# Patient Record
Sex: Male | Born: 1960 | Race: Black or African American | Hispanic: No | Marital: Married | Smoking: Current every day smoker
Health system: Southern US, Community
[De-identification: ages and names within clinical notes are randomized; demographics above are authoritative.]

## PROBLEM LIST (undated history)

## (undated) DIAGNOSIS — R7303 Prediabetes: Secondary | ICD-10-CM

## (undated) DIAGNOSIS — M2550 Pain in unspecified joint: Secondary | ICD-10-CM

## (undated) DIAGNOSIS — N451 Epididymitis: Secondary | ICD-10-CM

## (undated) DIAGNOSIS — N400 Enlarged prostate without lower urinary tract symptoms: Secondary | ICD-10-CM

## (undated) DIAGNOSIS — H269 Unspecified cataract: Secondary | ICD-10-CM

## (undated) DIAGNOSIS — T884XXA Failed or difficult intubation, initial encounter: Secondary | ICD-10-CM

## (undated) DIAGNOSIS — G4733 Obstructive sleep apnea (adult) (pediatric): Secondary | ICD-10-CM

## (undated) DIAGNOSIS — M549 Dorsalgia, unspecified: Secondary | ICD-10-CM

## (undated) DIAGNOSIS — E785 Hyperlipidemia, unspecified: Secondary | ICD-10-CM

## (undated) DIAGNOSIS — G4730 Sleep apnea, unspecified: Secondary | ICD-10-CM

## (undated) DIAGNOSIS — R05 Cough: Secondary | ICD-10-CM

## (undated) DIAGNOSIS — J189 Pneumonia, unspecified organism: Secondary | ICD-10-CM

## (undated) DIAGNOSIS — M1990 Unspecified osteoarthritis, unspecified site: Secondary | ICD-10-CM

## (undated) DIAGNOSIS — R058 Other specified cough: Secondary | ICD-10-CM

## (undated) HISTORY — PX: CATARACT EXTRACTION W/ INTRAOCULAR LENS IMPLANT: SHX1309

## (undated) HISTORY — PX: TESTICLE REMOVAL: SHX68

## (undated) HISTORY — DX: Unspecified cataract: H26.9

## (undated) HISTORY — DX: Pain in unspecified joint: M25.50

## (undated) HISTORY — DX: Hyperlipidemia, unspecified: E78.5

## (undated) HISTORY — DX: Morbid (severe) obesity due to excess calories: E66.01

## (undated) HISTORY — PX: WISDOM TOOTH EXTRACTION: SHX21

## (undated) HISTORY — DX: Obstructive sleep apnea (adult) (pediatric): G47.33

## (undated) HISTORY — DX: Unspecified osteoarthritis, unspecified site: M19.90

## (undated) HISTORY — DX: Sleep apnea, unspecified: G47.30

## (undated) HISTORY — DX: Dorsalgia, unspecified: M54.9

## (undated) HISTORY — DX: Epididymitis: N45.1

---

## 1999-11-21 ENCOUNTER — Emergency Department (HOSPITAL_COMMUNITY): Admission: EM | Admit: 1999-11-21 | Discharge: 1999-11-21 | Payer: Self-pay | Admitting: Emergency Medicine

## 2001-06-19 ENCOUNTER — Emergency Department (HOSPITAL_COMMUNITY): Admission: EM | Admit: 2001-06-19 | Discharge: 2001-06-19 | Payer: Self-pay | Admitting: Emergency Medicine

## 2001-06-19 ENCOUNTER — Encounter: Payer: Self-pay | Admitting: Emergency Medicine

## 2005-05-04 ENCOUNTER — Ambulatory Visit: Payer: Self-pay | Admitting: Internal Medicine

## 2006-02-23 ENCOUNTER — Ambulatory Visit: Payer: Self-pay | Admitting: Internal Medicine

## 2006-03-09 ENCOUNTER — Ambulatory Visit (HOSPITAL_BASED_OUTPATIENT_CLINIC_OR_DEPARTMENT_OTHER): Admission: RE | Admit: 2006-03-09 | Discharge: 2006-03-09 | Payer: Self-pay | Admitting: Internal Medicine

## 2006-03-16 ENCOUNTER — Ambulatory Visit: Payer: Self-pay | Admitting: Pulmonary Disease

## 2006-05-28 ENCOUNTER — Ambulatory Visit: Payer: Self-pay | Admitting: Internal Medicine

## 2006-12-15 ENCOUNTER — Ambulatory Visit: Payer: Self-pay | Admitting: Internal Medicine

## 2006-12-15 LAB — CONVERTED CEMR LAB
ALT: 36 units/L (ref 0–40)
AST: 22 units/L (ref 0–37)
Albumin: 3.5 g/dL (ref 3.5–5.2)
Alkaline Phosphatase: 80 units/L (ref 39–117)
BUN: 11 mg/dL (ref 6–23)
Bilirubin, Direct: 0.1 mg/dL (ref 0.0–0.3)
CO2: 26 meq/L (ref 19–32)
CRP, High Sensitivity: 9 — ABNORMAL HIGH (ref 0.00–5.00)
Calcium: 9.1 mg/dL (ref 8.4–10.5)
Chloride: 109 meq/L (ref 96–112)
Cholesterol: 176 mg/dL (ref 0–200)
Creatinine, Ser: 1.1 mg/dL (ref 0.4–1.5)
GFR calc Af Amer: 92 mL/min
GFR calc non Af Amer: 76 mL/min
Glucose, Bld: 97 mg/dL (ref 70–99)
HDL: 26.9 mg/dL — ABNORMAL LOW (ref >39.0)
LDL Cholesterol: 116 mg/dL — ABNORMAL HIGH (ref 0–99)
PSA: 1.72 ng/mL (ref 0.10–4.00)
Potassium: 4.2 meq/L (ref 3.5–5.1)
Sodium: 139 meq/L (ref 135–145)
TSH: 1.43 microintl units/mL (ref 0.35–5.50)
Total Bilirubin: 0.4 mg/dL (ref 0.3–1.2)
Total CHOL/HDL Ratio: 6.5
Total Protein: 6.8 g/dL (ref 6.0–8.3)
Triglycerides: 164 mg/dL — ABNORMAL HIGH (ref 0–149)
VLDL: 33 mg/dL (ref 0–40)

## 2007-06-30 ENCOUNTER — Ambulatory Visit: Payer: Self-pay | Admitting: Internal Medicine

## 2007-08-03 ENCOUNTER — Ambulatory Visit: Payer: Self-pay | Admitting: Pulmonary Disease

## 2007-08-03 ENCOUNTER — Ambulatory Visit (HOSPITAL_BASED_OUTPATIENT_CLINIC_OR_DEPARTMENT_OTHER): Admission: RE | Admit: 2007-08-03 | Discharge: 2007-08-03 | Payer: Self-pay | Admitting: Internal Medicine

## 2007-08-24 ENCOUNTER — Ambulatory Visit: Payer: Self-pay | Admitting: Internal Medicine

## 2007-09-06 DIAGNOSIS — N453 Epididymo-orchitis: Secondary | ICD-10-CM | POA: Insufficient documentation

## 2007-09-06 DIAGNOSIS — G4733 Obstructive sleep apnea (adult) (pediatric): Secondary | ICD-10-CM

## 2007-09-28 ENCOUNTER — Telehealth: Payer: Self-pay | Admitting: Internal Medicine

## 2007-10-11 ENCOUNTER — Ambulatory Visit: Payer: Self-pay | Admitting: Pulmonary Disease

## 2007-11-21 ENCOUNTER — Telehealth: Payer: Self-pay | Admitting: Pulmonary Disease

## 2007-11-22 ENCOUNTER — Encounter: Payer: Self-pay | Admitting: Pulmonary Disease

## 2007-12-16 ENCOUNTER — Ambulatory Visit: Payer: Self-pay | Admitting: Internal Medicine

## 2007-12-16 DIAGNOSIS — R05 Cough: Secondary | ICD-10-CM

## 2007-12-21 ENCOUNTER — Ambulatory Visit: Payer: Self-pay | Admitting: Internal Medicine

## 2007-12-22 DIAGNOSIS — H6123 Impacted cerumen, bilateral: Secondary | ICD-10-CM | POA: Insufficient documentation

## 2007-12-26 LAB — CONVERTED CEMR LAB
ALT: 38 units/L (ref 0–53)
AST: 23 units/L (ref 0–37)
Albumin: 3.6 g/dL (ref 3.5–5.2)
Alkaline Phosphatase: 80 units/L (ref 39–117)
BUN: 8 mg/dL (ref 6–23)
Basophils Absolute: 0 10*3/uL (ref 0.0–0.1)
Basophils Relative: 0.5 % (ref 0.0–1.0)
Bilirubin Urine: NEGATIVE
Bilirubin, Direct: 0.1 mg/dL (ref 0.0–0.3)
CO2: 30 meq/L (ref 19–32)
Calcium: 9.4 mg/dL (ref 8.4–10.5)
Chloride: 104 meq/L (ref 96–112)
Cholesterol: 172 mg/dL (ref 0–200)
Creatinine, Ser: 1.1 mg/dL (ref 0.4–1.5)
Eosinophils Absolute: 0.2 10*3/uL (ref 0.0–0.6)
Eosinophils Relative: 2.2 % (ref 0.0–5.0)
GFR calc Af Amer: 92 mL/min
GFR calc non Af Amer: 76 mL/min
Glucose, Bld: 100 mg/dL — ABNORMAL HIGH (ref 70–99)
HCT: 40.4 % (ref 39.0–52.0)
HDL: 22.3 mg/dL — ABNORMAL LOW (ref >39.0)
Hemoglobin: 13.4 g/dL (ref 13.0–17.0)
Ketones, ur: NEGATIVE mg/dL
LDL Cholesterol: 121 mg/dL — ABNORMAL HIGH (ref 0–99)
Leukocytes, UA: NEGATIVE
Lymphocytes Relative: 20.4 % (ref 12.0–46.0)
MCHC: 33.1 g/dL (ref 30.0–36.0)
MCV: 82.6 fL (ref 78.0–100.0)
Monocytes Absolute: 1.1 10*3/uL — ABNORMAL HIGH (ref 0.2–0.7)
Monocytes Relative: 12 % — ABNORMAL HIGH (ref 3.0–11.0)
Neutro Abs: 5.9 10*3/uL (ref 1.4–7.7)
Neutrophils Relative %: 64.9 % (ref 43.0–77.0)
Nitrite: NEGATIVE
Platelets: 317 10*3/uL (ref 150–400)
Potassium: 4.2 meq/L (ref 3.5–5.1)
Pro B Natriuretic peptide (BNP): 13 pg/mL (ref 0.0–100.0)
RBC: 4.89 M/uL (ref 4.22–5.81)
RDW: 13.8 % (ref 11.5–14.6)
Sodium: 141 meq/L (ref 135–145)
Specific Gravity, Urine: 1.01 (ref 1.000–1.03)
TSH: 1.2 microintl units/mL (ref 0.35–5.50)
Total Bilirubin: 0.4 mg/dL (ref 0.3–1.2)
Total CHOL/HDL Ratio: 7.7
Total Protein, Urine: NEGATIVE mg/dL
Total Protein: 6.9 g/dL (ref 6.0–8.3)
Triglycerides: 145 mg/dL (ref 0–149)
Urine Glucose: NEGATIVE mg/dL
Urobilinogen, UA: 0.2 (ref 0.0–1.0)
VLDL: 29 mg/dL (ref 0–40)
WBC: 9.1 10*3/uL (ref 4.5–10.5)
pH: 7 (ref 5.0–8.0)

## 2008-01-12 ENCOUNTER — Telehealth (INDEPENDENT_AMBULATORY_CARE_PROVIDER_SITE_OTHER): Payer: Self-pay | Admitting: *Deleted

## 2008-01-25 ENCOUNTER — Telehealth: Payer: Self-pay | Admitting: Internal Medicine

## 2009-04-16 ENCOUNTER — Telehealth (INDEPENDENT_AMBULATORY_CARE_PROVIDER_SITE_OTHER): Payer: Self-pay | Admitting: *Deleted

## 2009-04-25 ENCOUNTER — Ambulatory Visit: Payer: Self-pay | Admitting: Internal Medicine

## 2009-04-25 ENCOUNTER — Ambulatory Visit: Payer: Self-pay | Admitting: Psychology

## 2009-04-25 DIAGNOSIS — E7800 Pure hypercholesterolemia, unspecified: Secondary | ICD-10-CM

## 2009-11-08 ENCOUNTER — Encounter: Payer: Self-pay | Admitting: Internal Medicine

## 2010-07-02 ENCOUNTER — Telehealth: Payer: Self-pay | Admitting: Internal Medicine

## 2010-08-19 ENCOUNTER — Encounter: Payer: Self-pay | Admitting: Internal Medicine

## 2010-08-19 ENCOUNTER — Ambulatory Visit: Payer: Self-pay | Admitting: Internal Medicine

## 2010-08-19 DIAGNOSIS — Z87898 Personal history of other specified conditions: Secondary | ICD-10-CM

## 2010-08-19 DIAGNOSIS — F17210 Nicotine dependence, cigarettes, uncomplicated: Secondary | ICD-10-CM

## 2010-08-19 LAB — CONVERTED CEMR LAB
ALT: 28 units/L (ref 0–53)
AST: 22 units/L (ref 0–37)
Albumin: 3.8 g/dL (ref 3.5–5.2)
Alkaline Phosphatase: 83 units/L (ref 39–117)
BUN: 12 mg/dL (ref 6–23)
Basophils Absolute: 0 10*3/uL (ref 0.0–0.1)
Basophils Relative: 0.4 % (ref 0.0–3.0)
Bilirubin Urine: NEGATIVE
Bilirubin, Direct: 0 mg/dL (ref 0.0–0.3)
CO2: 26 meq/L (ref 19–32)
Calcium: 9.4 mg/dL (ref 8.4–10.5)
Chloride: 103 meq/L (ref 96–112)
Cholesterol: 201 mg/dL — ABNORMAL HIGH (ref 0–200)
Creatinine, Ser: 0.9 mg/dL (ref 0.4–1.5)
Direct LDL: 139.5 mg/dL
Eosinophils Absolute: 0.2 10*3/uL (ref 0.0–0.7)
Eosinophils Relative: 1.8 % (ref 0.0–5.0)
GFR calc non Af Amer: 113.1 mL/min (ref >60)
Glucose, Bld: 90 mg/dL (ref 70–99)
HCT: 42 % (ref 39.0–52.0)
HDL: 27.5 mg/dL — ABNORMAL LOW (ref >39.00)
Hemoglobin: 14.3 g/dL (ref 13.0–17.0)
Ketones, ur: NEGATIVE mg/dL
Leukocytes, UA: NEGATIVE
Lymphocytes Relative: 19.2 % (ref 12.0–46.0)
Lymphs Abs: 2 10*3/uL (ref 0.7–4.0)
MCHC: 34.1 g/dL (ref 30.0–36.0)
MCV: 83.5 fL (ref 78.0–100.0)
Monocytes Absolute: 0.7 10*3/uL (ref 0.1–1.0)
Monocytes Relative: 6.6 % (ref 3.0–12.0)
Neutro Abs: 7.6 10*3/uL (ref 1.4–7.7)
Neutrophils Relative %: 72 % (ref 43.0–77.0)
Nitrite: NEGATIVE
Platelets: 289 10*3/uL (ref 150.0–400.0)
Potassium: 4.4 meq/L (ref 3.5–5.1)
RBC: 5.03 M/uL (ref 4.22–5.81)
RDW: 15 % — ABNORMAL HIGH (ref 11.5–14.6)
Sodium: 136 meq/L (ref 135–145)
Specific Gravity, Urine: 1.02 (ref 1.000–1.030)
TSH: 1.22 microintl units/mL (ref 0.35–5.50)
Total Bilirubin: 0.4 mg/dL (ref 0.3–1.2)
Total CHOL/HDL Ratio: 7
Total Protein, Urine: NEGATIVE mg/dL
Total Protein: 7.1 g/dL (ref 6.0–8.3)
Triglycerides: 180 mg/dL — ABNORMAL HIGH (ref 0.0–149.0)
Urine Glucose: NEGATIVE mg/dL
Urobilinogen, UA: 0.2 (ref 0.0–1.0)
VLDL: 36 mg/dL (ref 0.0–40.0)
WBC: 10.6 10*3/uL — ABNORMAL HIGH (ref 4.5–10.5)
pH: 6.5 (ref 5.0–8.0)

## 2010-08-20 LAB — CONVERTED CEMR LAB
PSA, Free Pct: 13 — ABNORMAL LOW (ref >25)
PSA, Free: 0.3 ng/mL
PSA: 2.27 ng/mL (ref 0.10–4.00)

## 2010-09-02 ENCOUNTER — Ambulatory Visit: Payer: Self-pay | Admitting: Internal Medicine

## 2010-09-17 ENCOUNTER — Encounter (INDEPENDENT_AMBULATORY_CARE_PROVIDER_SITE_OTHER): Payer: Self-pay | Admitting: *Deleted

## 2010-09-19 ENCOUNTER — Ambulatory Visit: Payer: Self-pay | Admitting: Internal Medicine

## 2010-10-08 ENCOUNTER — Telehealth: Payer: Self-pay | Admitting: Internal Medicine

## 2010-10-09 ENCOUNTER — Ambulatory Visit: Payer: Self-pay | Admitting: Internal Medicine

## 2010-10-23 ENCOUNTER — Ambulatory Visit: Payer: Self-pay | Admitting: Internal Medicine

## 2010-12-01 ENCOUNTER — Telehealth: Payer: Self-pay | Admitting: Internal Medicine

## 2010-12-07 LAB — CONVERTED CEMR LAB
ALT: 32 units/L (ref 0–53)
AST: 24 units/L (ref 0–37)
Albumin: 3.7 g/dL (ref 3.5–5.2)
Alkaline Phosphatase: 73 units/L (ref 39–117)
BUN: 11 mg/dL (ref 6–23)
Basophils Absolute: 0 10*3/uL (ref 0.0–0.1)
Basophils Relative: 0 % (ref 0.0–3.0)
Bilirubin, Direct: 0.1 mg/dL (ref 0.0–0.3)
CO2: 31 meq/L (ref 19–32)
CRP, High Sensitivity: 10 — ABNORMAL HIGH (ref 0.00–5.00)
Calcium: 9.4 mg/dL (ref 8.4–10.5)
Chloride: 105 meq/L (ref 96–112)
Cholesterol: 193 mg/dL (ref 0–200)
Creatinine, Ser: 1 mg/dL (ref 0.4–1.5)
Eosinophils Absolute: 0.2 10*3/uL (ref 0.0–0.7)
Eosinophils Relative: 1.8 % (ref 0.0–5.0)
GFR calc non Af Amer: 101.98 mL/min (ref >60)
Glucose, Bld: 90 mg/dL (ref 70–99)
HCT: 41.1 % (ref 39.0–52.0)
HDL: 27.8 mg/dL — ABNORMAL LOW (ref >39.00)
Hemoglobin: 14.1 g/dL (ref 13.0–17.0)
LDL Cholesterol: 143 mg/dL — ABNORMAL HIGH (ref 0–99)
Lymphocytes Relative: 19 % (ref 12.0–46.0)
Lymphs Abs: 1.8 10*3/uL (ref 0.7–4.0)
MCHC: 34.4 g/dL (ref 30.0–36.0)
MCV: 83.2 fL (ref 78.0–100.0)
Monocytes Absolute: 0.7 10*3/uL (ref 0.1–1.0)
Monocytes Relative: 6.8 % (ref 3.0–12.0)
Neutro Abs: 7 10*3/uL (ref 1.4–7.7)
Neutrophils Relative %: 72.4 % (ref 43.0–77.0)
PSA: 1.75 ng/mL (ref 0.10–4.00)
Platelets: 251 10*3/uL (ref 150.0–400.0)
Potassium: 4.4 meq/L (ref 3.5–5.1)
RBC: 4.94 M/uL (ref 4.22–5.81)
RDW: 14.3 % (ref 11.5–14.6)
Sodium: 142 meq/L (ref 135–145)
TSH: 1.5 microintl units/mL (ref 0.35–5.50)
Total Bilirubin: 0.7 mg/dL (ref 0.3–1.2)
Total CHOL/HDL Ratio: 7
Total Protein: 7.2 g/dL (ref 6.0–8.3)
Triglycerides: 113 mg/dL (ref 0.0–149.0)
VLDL: 22.6 mg/dL (ref 0.0–40.0)
WBC: 9.7 10*3/uL (ref 4.5–10.5)

## 2010-12-11 NOTE — Progress Notes (Signed)
Summary: Resch'd COL  Phone Note Call from Patient   Caller: Patient Call For: Dr. Marina Goodell Summary of Call: pt. r/s his COL sch'd tomorrow for 10-23-10 b/c he works at [employer] and it is exam week. Would you like pt. charged the cancelation fee? Initial call taken by: Karna Christmas,  October 08, 2010 1:04 PM  Follow-up for Phone Call        if he was advised of the policy, and clearly he should have been advised, then HE SHOULD BE CHARGED. Exam weeks our known well in advance. Canceling one day prior to his is  inconsiderate. Follow-up by: Hilarie Fredrickson MD,  October 08, 2010 1:20 PM  Additional Follow-up for Phone Call Additional follow up Details #1::        Patient BILLED. Additional Follow-up by: Leanor Kail [facility],  October 10, 2010 1:46 PM

## 2010-12-11 NOTE — Letter (Signed)
Summary: [facility] Instructions  [facility]  [address] [place], [place] [address]   Phone: [phone]  Fax: [phone]       [name] [name]    Nov 21, 1960    MRN: [id number]        Procedure Day Dorna Bloom:  Jake Shark  09/30/10     Arrival Time:  9:30AM      Procedure Time:  10:30AM     Location of Procedure:                    _X _  [facility] (4th Floor)                     PREPARATION FOR COLONOSCOPY WITH MOVIPREP   Starting 5 days prior to your procedure 09/25/10 do not eat nuts, seeds, popcorn, corn, beans, peas,  salads, or any raw vegetables.  Do not take any fiber supplements (e.g. Metamucil, Citrucel, and Benefiber).  THE DAY BEFORE YOUR PROCEDURE         DATE: 09/29/10  DAY: MONDAY  1.  Drink clear liquids the entire day-NO SOLID FOOD  2.  Do not drink anything colored red or purple.  Avoid juices with pulp.  No orange juice.  3.  Drink at least 64 oz. (8 glasses) of fluid/clear liquids during the day to prevent dehydration and help the prep work efficiently.  CLEAR LIQUIDS INCLUDE: Water Jello Ice Popsicles Tea (sugar ok, no milk/cream) Powdered fruit flavored drinks Coffee (sugar ok, no milk/cream) Gatorade Juice: apple, white grape, white cranberry  Lemonade Clear bullion, consomm, broth Carbonated beverages (any kind) Strained chicken noodle soup Hard Candy                             4.  In the morning, mix first dose of MoviPrep solution:    Empty 1 Pouch A and 1 Pouch B into the disposable container    Add lukewarm drinking water to the top line of the container. Mix to dissolve    Refrigerate (mixed solution should be used within 24 hrs)  5.  Begin drinking the prep at 5:00 p.m. The MoviPrep container is divided by 4 marks.   Every 15 minutes drink the solution down to the next mark (approximately 8 oz) until the full liter is complete.   6.  Follow completed prep with 16 oz of clear liquid of your choice  (Nothing red or purple).  Continue to drink clear liquids until bedtime.  7.  Before going to bed, mix second dose of MoviPrep solution:    Empty 1 Pouch A and 1 Pouch B into the disposable container    Add lukewarm drinking water to the top line of the container. Mix to dissolve    Refrigerate  THE DAY OF YOUR PROCEDURE      DATE: 09/30/10  DAY: TUESDAY  Beginning at 5:30AM (5 hours before procedure):         1. Every 15 minutes, drink the solution down to the next mark (approx 8 oz) until the full liter is complete.  2. Follow completed prep with 16 oz. of clear liquid of your choice.    3. You may drink clear liquids until 8:30AM (2 HOURS BEFORE PROCEDURE).   MEDICATION INSTRUCTIONS  Unless otherwise instructed, you should take regular prescription medications with a small sip of water   as early as possible the morning of your  procedure.           OTHER INSTRUCTIONS  You will need a responsible adult at least 51 years of age to accompany you and drive you home.   This person must remain in the waiting room during your procedure.  Wear loose fitting clothing that is easily removed.  Leave jewelry and other valuables at home.  However, you may wish to bring a book to read or  an iPod/MP3 player to listen to music as you wait for your procedure to start.  Remove all body piercing jewelry and leave at home.  Total time from sign-in until discharge is approximately 2-3 hours.  You should go home directly after your procedure and rest.  You can resume normal activities the  day after your procedure.  The day of your procedure you should not:   Drive   Make legal decisions   Operate machinery   Drink alcohol   Return to work  You will receive specific instructions about eating, activities and medications before you leave.    The above instructions have been reviewed and explained to me by   Karl Bales RN  September 19, 2010 8:21 AM    I fully  understand and can verbalize these instructions _____________________________ Date _________

## 2010-12-11 NOTE — Letter (Signed)
Summary: [facility]   Imported By: Sherian Rein 11/20/2009 10:27:58  _____________________________________________________________________  External Attachment:    Type:   Image     Comment:   External Document

## 2010-12-11 NOTE — Letter (Signed)
Summary: Pre Visit Letter Revised   Gastroenterology  [address] [place], [place] [address]   Phone: [phone]  Fax: [phone]        08/19/2010 MRN: [id number] [facility] [address] [place], [place]  [address]                            Procedure Date:  11-22 at 10:30am Welcome to the Gastroenterology Division at [facility].    You are scheduled to see a nurse for your pre-procedure visit on 09-16-10 at 1:30pm on the 3rd floor at [facility], [address]  We ask that you try to arrive at our office 15 minutes prior to your appointment time to allow for check-in.  Please take a minute to review the attached form.  If you answer "Yes" to one or more of the questions on the first page, we ask that you call the person listed at your earliest opportunity.  If you answer "No" to all of the questions, please complete the rest of the form and bring it to your appointment.    Your nurse visit will consist of discussing your medical and surgical history, your immediate family medical history, and your medications.   If you are unable to list all of your medications on the form, please bring the medication bottles to your appointment and we will list them.  We will need to be aware of both prescribed and over the counter drugs.  We will need to know exact dosage information as well.    Please be prepared to read and sign documents such as consent forms, a financial agreement, and acknowledgement forms.  If necessary, and with your consent, a friend or relative is welcome to sit-in on the nurse visit with you.  Please bring your insurance card so that we may make a copy of it.  If your insurance requires a referral to see a specialist, please bring your referral form from your primary care physician.  No co-pay is required for this nurse visit.     If you cannot keep your appointment, please call [phone] to cancel or reschedule prior to your  appointment date.  This allows [place] the opportunity to schedule an appointment for another patient in need of care.    Thank you for choosing  Gastroenterology for your medical needs.  We appreciate the opportunity to care for you.  Please visit [place] at our website  to learn more about our practice.  Sincerely, The Gastroenterology Division

## 2010-12-11 NOTE — Assessment & Plan Note (Signed)
Summary: Primary svc/ cpx still smoking, needs colonoscopy   Primary Provider/Referring Provider:  Sherene Sires  CC:  cpx fasting.  History of Present Illness: 49  yobm  with morbid obesity actively smoking with documented sleep apnea for which he has seen Dr. Craige Cotta with no improvement in his weight over the despite previous extensive counseling regarding calorie balance issues.     April 25, 2009 ov states using cpap 15% and feels better rested on it but having trouble tolerating it. Denies excess daytime hypersomnolence. No sob or cough.  -[phone]   August 19, 2010 cpx not using cpap no excess sleepiness, beginning to exercise with wife = walking up to 20 min.  Pt denies any significant sore throat, dysphagia, itching, sneezing,  nasal congestion or excess secretions,  fever, chills, sweats, unintended wt loss, pleuritic or exertional cp, hempoptysis, change in activity tolerance  orthopnea pnd or leg swelling   Preventive Screening-Counseling & Management  Alcohol-Tobacco     Smoking Status: current     Smoking Cessation Counseling: yes  Current Medications (verified): 1)  Cvs Aspirin 325 Mg  Tabs (Aspirin) .... 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 age 65  OBSTRUCTIVE SLEEP APNEA (ICD-327.23)..........................................Marland KitchenSood MORBID OBESITY (ICD-278.01)    -   Target wt  =  208  for BMI < 30  Hyperlipidemia    male, h/o hbp and borderline dm, pos fm hx=  target  LDL < 130 [employer]INTE[employer]NCE    - tD  9/02    - Pneumovax April 25, 2009    - Colonsocopy rec August 19, 2010     - CPX August 19, 2010   Family History: Coronary Heart Disease mother onset at 38 Diabetes mother, father, older brother no cancer father mother  alive and well at 51  Social History: Patient is a current smoker, considering to quit  but not ready-1/2 PPD Occ [employer] but 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 VSS.  250 >  242 April 25, 2009 > 250 August 19, 2010  HEENT: nl dentition, turbinates, and orophanx. Nl external ear canals without cough reflex NECK :  without JVD/Nodes/TM/ nl carotid upstrokes bilaterally LUNGS: no acc muscle use, clear to A and P bilaterally without cough on insp or exp maneuvers CV:  RRR  no s3 or murmur or increase in P2, no edema   pedal pulses sym ABD:  soft and nontender with nl excursion in the supine position. No bruits or organomegaly, bowel sounds nl MS:  warm without deformities, calf tenderness, cyanosis or clubbing SKIN: warm and dry without lesions   NEURO:  alert, approp, no deficits GU:  L testicle surgically absent , R nl RECTAL:  mod to severe  bph stool g -   Cholesterol          [  H]  201 mg/dL                   9-811     ATP III Classification            Desirable:  < 200 mg/dL                    Borderline High:  200 - 239 mg/dL               High:  > = 240 mg/dL   Triglycerides        [H]  180.0 mg/dL                 9.1-478.2     Normal:  <150 mg/dL     Borderline High:  956 - 199 mg/dL   HDL                  [L]  21.30 mg/dL                 >86.57   VLDL Cholesterol          36.0 mg/dL                  8.4-69.6  CHO/HDL Ratio:  CHD Risk                             7                    Men          Women     1/2 Average Risk     3.4          3.3     Average Risk          5.0          4.4     2X Average Risk          9.6          7.1     3X Average Risk          15.0          11.0                            Tests: (2) BMP (METABOL)   Sodium                    136 mEq/L                   135-145   Potassium                 4.4 mEq/L                   3.5-5.1   Chloride                  103 mEq/L                   96-112   Carbon Dioxide            26 mEq/L                    19-32   Glucose                   90 mg/dL  70-99   BUN                       12 mg/dL                    1-61   Creatinine                0.9 mg/dL                   0.9-6.0   Calcium                   9.4 mg/dL                   4.5-40.9   GFR                       113.10 mL/min               >60  Tests: (3) CBC Platelet w/Diff (CBCD)   White Cell Count     [H]  10.6 K/uL                   4.5-10.5   Red Cell Count            5.03 Mil/uL                 4.22-5.81   Hemoglobin                14.3 g/dL                   81.1-91.4   Hematocrit                42.0 %                      39.0-52.0   MCV                       83.5 fl                     78.0-100.0   MCHC                      34.1 g/dL                   78.2-95.6   RDW                  [H]  15.0 %                      11.5-14.6   Platelet Count            289.0 K/uL                  150.0-400.0   Neutrophil %              72.0 %                      43.0-77.0   Lymphocyte %              19.2 %                      12.0-46.0   Monocyte %  6.6 %                       3.0-12.0   Eosinophils%              1.8 %                       0.0-5.0   Basophils %               0.4 %                       0.0-3.0   Neutrophill Absolute      7.6 K/uL                    1.4-7.7   Lymphocyte Absolute       2.0 K/uL                    0.7-4.0   Monocyte Absolute         0.7 K/uL                    0.1-1.0  Eosinophils, Absolute                             0.2 K/uL                    0.0-0.7   Basophils Absolute        0.0 K/uL                    0.0-0.1  Tests: (4) Hepatic/Liver Function Panel (HEPATIC)   Total  Bilirubin           0.4 mg/dL                   8.2-9.5   Direct Bilirubin          0.0 mg/dL                   6.2-1.3   Alkaline Phosphatase      83 U/L                      39-117   AST                       22 U/L                      0-37   ALT                       28 U/L                      0-53   Total Protein             7.1 g/dL                    0.8-6.5   Albumin                   3.8 g/dL                    7.8-4.6  Tests: (5) TSH (TSH)   FastTSH  1.22 uIU/mL                 0.35-5.50  Tests: (6) UDip Only (UDIP)   Color                     YELLOW       RANGE:  Yellow;Lt. Yellow   Clarity                   CLEAR                       Clear   Specific Gravity          1.020                       1.000 - 1.030   Urine Ph                  6.5                         5.0-8.0   Protein                   NEGATIVE                    Negative   Urine Glucose             NEGATIVE                    Negative   Ketones                   NEGATIVE                    Negative   Urine Bilirubin           NEGATIVE                    Negative   Blood                     TRACE-INTACT                Negative   Urobilinogen              0.2                         0.0 - 1.0   Leukocyte Esterace        NEGATIVE                    Negative   Nitrite                   NEGATIVE                    Negative  Tests: (7) Cholesterol LDL - Direct (DIRLDL)  Cholesterol LDL - Direct 139    Tests: (1) PSA, Total and Free (3515)   PSA                       2.27 ng/mL                  0.10-4.00     Test Methodology: Hybritech PSA   PSA, Free  0.3 ng/mL   PSA, %Free           [L]  13 %                        > 25                           139.5 mg/dL  Impression & Recommendations:  Problem # 1:  HYPERCHOLESTEROLEMIA (ICD-272.0) Hyperlipidemia    male, h/o hbp and borderline dm, pos fm hx=  target  LDL < 130  Labs Reviewed: SGOT: 24 (04/25/2009)   SGPT: 32  (04/25/2009)   HDL:27.80 (04/25/2009), 22.3 (12/21/2007)  LDL:143 (04/25/2009), 121 (12/21/2007)  > 139 August 19, 2010   Chol:193 (04/25/2009), 172 (12/21/2007)  Trig:113.0 (04/25/2009), 145 (12/21/2007)   Work on wt loss first  Problem # 2:  MORBID OBESITY (ICD-278.01)  Weight control is a matter of calorie balance which needs to be tilted in the pt's favor by eating less and exercising more.  Specifically, I recommended  exercise at a level where pt  is short of breath but not out of breath 30 minutes daily.  If not losing weight on this program, I would strongly recommend pt see a nutritionist with a food diary recorded for two weeks prior to the visit.     Problem # 3:  BENIGN PROSTATIC HYPERTROPHY, HX OF (ICD-V13.8) Discussed in detail all the  indications, usual  risks and alternatives  relative to the benefits with patient who agrees to proceed with psa testing serially.   Problem # 4:  SMOKER (ICD-305.1) Discussed but not ready to committ to quit at this point - emphasized risks involved in continuing smoking and that patient should consider these in the context of the cost of smoking relative to the benefit obtained.   Medications Added to Medication List This Visit: 1)  Cvs Aspirin 325 Mg Tabs (Aspirin) .... Every other day- takes when remembers  Other Orders: EKG w/ Interpretation ([address]000) T-PSA Free ([address]) Gastroenterology Referral (GI) TLB-Lipid Panel (80061-LIPID) TLB-BMP (Basic Metabolic Panel-BMET) (80048-METABOL) TLB-CBC Platelet - w/Differential (85025-CBCD) TLB-Hepatic/Liver Function Pnl (80076-HEPATIC) TLB-TSH (Thyroid Stimulating Hormone) (84443-TSH) TLB-Udip ONLY (81003-UDIP) Est. Patient 40-64 years ([address]329)  Patient Instructions: 1)  Call [phone] for your results w/in next 3 days - if there's something important  I feel you need to know,  I'll be in touch with you directly.  2)  See Patient Care Coordinator before leaving for scheduling colonscopy 3)   Weight control is simply a matter of calorie balance which needs to be tilted in your favor by eating less and exercising more.  To get the most out of exercise, you need to be continuously aware that you are short of breath, but never out of breath, for 30 minutes daily. As you improve, it will actually be easier for you to do the same amount in  30 minutes so always push to the level where you are short of breath.  If this does not result in gradual weight reduction,  I recommend  a nutritionist for a food diary

## 2010-12-11 NOTE — Assessment & Plan Note (Signed)
Summary: pnuemonia/ mbw  Nurse Visit   Allergies: No Known Drug Allergies  Immunizations Administered:  Pneumonia Vaccine:    Vaccine Type: Pneumovax    Site: left deltoid    Mfr: Merck    Dose: 0.5 ml    Route: IM    Given by: Lynnae Sandhoff Ford,CMA (AAMA)    Exp. Date: 02/27/2011    Lot #: [id number]    VIS given: 10/14/09 version given September 02, 2010.  Orders Added: 1)  Pneumococcal Vaccine [90732] 2)  Admin 1st Vaccine [11914]

## 2010-12-11 NOTE — Miscellaneous (Signed)
Summary: [facility] previsit  Clinical Lists Changes  Medications: Added new medication of MOVIPREP 100 GM  SOLR (PEG-KCL-NACL-NASULF-NA ASC-C) As per prep instructions. - Signed Rx of MOVIPREP 100 GM  SOLR (PEG-KCL-NACL-NASULF-NA ASC-C) As per prep instructions.;  #1 x 0;  Signed;  Entered by: Karl Bales RN;  Authorized by: Hilarie Fredrickson MD;  Method used: [facility]  [address] [phone]*, [address], [place], [place], [place]  [id number], Ph: [phone] or [phone], Fax: [phone] Observations: Added new observation of NKA: T (09/19/2010 8:02)    Prescriptions: MOVIPREP 100 GM  SOLR (PEG-KCL-NACL-NASULF-NA ASC-C) As per prep instructions.  #1 x 0   Entered by:   Karl Bales RN   Authorized by:   Hilarie Fredrickson MD   Signed by:   Karl Bales RN on 09/19/2010   Method used:   [facility] to        [facility] [phone]* (retail)       [address]       [place], [place]  [id number]       Ph: [phone] or [phone]       Fax: [phone]   RxID:   [id number]

## 2010-12-11 NOTE — Progress Notes (Signed)
Summary: nos appt  Phone Note Call from Patient   Caller: [email or web address]  Call For: wert Summary of Call: LMTCB x2 to rsc nos from 8/23. Initial call taken by: Darletta Moll,  July 02, 2010 2:25 PM

## 2010-12-17 NOTE — Progress Notes (Signed)
Summary: Rescheduled procedure  Phone Note Call from Patient   Caller: PATIENTS wife Call For: Dr. Marina Goodell Reason for Call: Talk to Nurse Summary of Call: Pts wife called to reschedule husbands procedure for wednesday 1/25, says that he can not get off work for [employer] now, do you wants to bill? Initial call taken by: [place] Johnson,  December 01, 2010 8:54 AM  Follow-up for Phone Call        this is the second time that they have rescheduled shortly before the procedure. Please Bill the patient. Follow-up by: Hilarie Fredrickson MD,  December 01, 2010 9:01 AM  Additional Follow-up for Phone Call Additional follow up Details #1::        Patient BILLED $100. Colon LATE CX Fee. Additional Follow-up by: Leanor Kail [facility],  December 09, 2010 5:46 PM

## 2011-01-06 ENCOUNTER — Other Ambulatory Visit: Payer: Self-pay | Admitting: Internal Medicine

## 2011-01-06 ENCOUNTER — Other Ambulatory Visit (AMBULATORY_SURGERY_CENTER): Payer: BC Managed Care – PPO | Admitting: Internal Medicine

## 2011-01-06 DIAGNOSIS — Z1211 Encounter for screening for malignant neoplasm of colon: Secondary | ICD-10-CM

## 2011-01-06 DIAGNOSIS — D126 Benign neoplasm of colon, unspecified: Secondary | ICD-10-CM

## 2011-01-06 DIAGNOSIS — K5730 Diverticulosis of large intestine without perforation or abscess without bleeding: Secondary | ICD-10-CM

## 2011-01-12 ENCOUNTER — Encounter: Payer: Self-pay | Admitting: Internal Medicine

## 2011-01-15 NOTE — Procedures (Addendum)
Summary: Colonoscopy  Patient: [name] [name] Note: All result statuses are Final unless otherwise noted.  Tests: (1) Colonoscopy (COL)   COL Colonoscopy           DONE     [facility]     [address]     [place], [place]  [address]          COLONOSCOPY PROCEDURE REPORT          PATIENT:  [name], [name]  MR#:  [id number]     BIRTHDATE:  January 14, 1961, 50 yrs. old  GENDER:  male     ENDOSCOPIST:  Wilhemina Bonito. Eda Keys, MD     REF. BY:  Charlaine Dalton. Sherene Sires, M.D.     PROCEDURE DATE:  01/06/2011     PROCEDURE:  Colonoscopy with snare polypectomy x 6     ASA CLASS:  Class II     INDICATIONS:  Routine Risk Screening     MEDICATIONS:   Fentanyl 100 mcg IV, Versed 10 mg IV          DESCRIPTION OF PROCEDURE:   After the risks benefits and     alternatives of the procedure were thoroughly explained, informed     consent was obtained.  Digital rectal exam was performed and     revealed no abnormalities.   The LB CF-[id number] endoscope     was introduced through the anus and advanced to the cecum, which     was identified by both the appendix and ileocecal valve, without     limitations.Time to cecum = 1:36 min. The quality of the prep was     excellent, using MoviPrep.  The instrument was then slowly     withdrawn (time = 12:48 min) as the colon was fully examined.     <<PROCEDUREIMAGES>>          FINDINGS:  There were SIX polyps identified and removed. All were     < 5mm. These were located in the ascending colon (2), transverse     colon (3), and descending colon. They appearred both hyperplastic     and adenomatous.. Polyps were snared without cautery. Retrieval     was successful.  Moderate diverticulosis was found in the left     colon.  Otherwise normal colonoscopy without other polyps, masses,     vascular ectasias, or inflammatory changes.   Retroflexed views in     the rectum revealed no abnormalities.    The scope was then     withdrawn from the patient and the  procedure completed.          COMPLICATIONS:  None     ENDOSCOPIC IMPRESSION:     1) Polyps, multiple small - removed     2) Moderate diverticulosis in the left colon     3) Otherwise normal colonoscopy          RECOMMENDATIONS:     1) Follow up colonoscopy in 5 years          ______________________________     Wilhemina Bonito. Eda Keys, MD          CC:  Nyoka Cowden, MD; The Patient          n.     eSIGNED:   Wilhemina Bonito. Eda Keys at 01/06/2011 11:09 AM          Linus Orn, [id number]  Note: An exclamation mark (!) indicates a result that was not dispersed into the flowsheet.  Document Creation Date: 01/06/2011 11:09 AM _______________________________________________________________________  (1) Order result status: Final Collection or observation date-time: 01/06/2011 10:59 Requested date-time:  Receipt date-time:  Reported date-time:  Referring Physician:   Ordering Physician: Fransico Setters [phone]) Specimen Source:  Source: Launa Grill Order Number: [id number] Lab site:   Appended Document: Colonoscopy recall     Procedures Next Due Date:    Colonoscopy: 12/2015

## 2011-01-20 NOTE — Letter (Signed)
Summary: Patient Notice- Polyp Results  [facility]  [address] [place], [place] [address]   Phone: [phone]  Fax: [phone]        January 12, 2011 MRN: [id number]    [facility] [address] [place], [place]  [address]    Dear Mr. BOWSHER,  I am pleased to inform you that the colon polyp(s) removed during your recent colonoscopy was (were) found to be benign (no cancer detected) upon pathologic examination.  I recommend you have a repeat colonoscopy examination in 5 years to look for recurrent polyps, as having colon polyps increases your risk for having recurrent polyps or even colon cancer in the future.  Should you develop new or worsening symptoms of abdominal pain, bowel habit changes or bleeding from the rectum or bowels, please schedule an evaluation with either your primary care physician or with me.  Additional information/recommendations:  __ No further action with gastroenterology is needed at this time. Please      follow-up with your primary care physician for your other healthcare      needs.    Please call us if you are having persistent problems or have questions about your condition that have not been fully answered at this time.  Sincerely,  Hilarie Fredrickson MD  This letter has been electronically signed by your physician.  Appended Document: Patient Notice- Polyp Results letter mailed

## 2011-03-24 NOTE — Assessment & Plan Note (Signed)
[name] [name]                             PULMONARY OFFICE NOTE   NAME:[name], [name] MARKOVITZ                    MRN:          [id number]  DATE:10/11/2007                            DOB:          23-Jan-1961    REFERRING PHYSICIAN:  Charlaine Dalton. Sherene Sires, MD, FCCP   PULMONARY CONSULTATION   I met [name] [name] today for evaluation of his sleep apnea.  He was  originally diagnosed with obstructive sleep apnea in May of 2007.  He  had an overnight polysomnogram done then and was found to have severe  obstructive sleep apnea with an apnea-hypopnea index of 43 and oxygen  saturation nadir of 78%.  He was started on CPAP after this with nasal  pillows but he said he had a lot of difficulty tolerating the pressure  and had sinus problems and as a result had stopped using his CPAP.  He,  however, continued to have problems as far as sleeping at night as well  as excessive daytime sleepiness during the day.  As a result of this, he  was referred back to the sleep lab for a CPAP titration study which was  done on August 03, 2007.  He was titrated to a CPAP pressure setting  of 19 with reduction in his apnea-hypopnea index to 1.4.  At this  pressure he was observed in both REM sleep and supine sleep.  He has  since been started on CPAP again.  He is currently using a full face  mask with a heated humidifier.  He says that he feels like the pressure  is too much and that he gets a lot of leaking around his mask which  wakes him up.  He has also been having problems as far as dryness in his  nose and his mouth.  He was not aware that he could adjust the  temperature on his humidifier.  He says if he could go over some of  these problems he does feel like the CPAP machine is helping.  His wife  says that he no longer snores and he does seem to sleep better at night  as well as feel better during the day.  His sleep pattern is that he  goes to bed around 10 p.m., he wakes up  once or twice during the night  to use the bathroom but then falls back to sleep fairly quickly.  He  gets up at 6:30 in the morning.  He denies having headaches.  He will  occasionally take a nap during the day.  He does not use anything to  help him fall asleep at night.  He drinks 2 cups of coffee in the  morning and occasionally will have a glass of tea during the day.  He  denies any problems as far as sleeping while driving.  There is no  history of sleep walking, sleep talking, nightmares, night terrors.  He  denies any symptoms of restless leg syndrome.  He also denies any  history of sleep hallucination, sleep paralysis, cataplexy.   PAST MEDICAL HISTORY:  Significant for:  1. Obstructive sleep apnea.  2. Morbid obesity.  3. Tobacco abuse.  4. Testicular torsion.   CURRENT MEDICATIONS:  1. Aspirin 325 mg every other day.  2. Viagra as needed.  3. Motrin as needed.  4. Mucinex as needed.   He has no known drug allergies.   SOCIAL HISTORY:  1. He is married.  2. He has 2 sons.  3. He has occasional alcohol use.  4. He smokes 1/2 pack of cigarettes a day.   FAMILY HISTORY:  Significant for:  1. His mother who snores and has heart disease.  2. Father has hypertension.   REVIEW OF SYSTEMS:  He does complain of intermittent episodes of dry  cough.   PHYSICAL EXAM:  He is 5 feet 11 inches tall, 252 pounds.  Temperature is  98.1, blood pressure is 116/74, heart rate is 68, oxygen saturation is  98% on room air.  HEENT:  Pupils reactive, there is no sinus tenderness.  He has prominent  nasal turbinates.  He has a Mallampati 4 airway with a large tongue, 2+  tonsils and elongated uvula.  He has no lymphadenopathy, no thyromegaly.  HEART:  S1 S2, regular rhythm.  CHEST:  There is decreased breath sounds but no wheezing or rales.  ABDOMEN:  Obese, soft, nontender.  EXTREMITIES:  No edema, cyanosis, clubbing.  NEUROLOGIC EXAM:  No focal deficits were appreciated.    IMPRESSION:  Severe obstructive sleep apnea as demonstrated by an apnea-  hypopnea index of 43 and oxygen saturation nadir of 78 percent.  I  discussed with him the results of his sleep studies.  I reviewed the  adverse consequences of untreated sleep apnea including risk for  hypertension, coronary artery disease, cerebrovascular disease, diabetes  and arrhythmias.  I discussed with him the importance of diet, exercise  and weight reduction as well as the avoidance of alcohol and sedatives.  Driving precautions were reviewed with him as well.  I reviewed the  various treatment options for his sleep apnea including continuous  positive airway pressure therapy, oral appliance and surgical  intervention.  He seems to have gained a symptomatic benefit from the  use of the continuous positive airway pressure, although he is having  trouble tolerating the high pressures.  What I will do is arrange for  him to undergo an auto continuous positive airway pressure titration  study for two weeks to determine if we can get away with a lower  pressure setting.  If it turns out that he does, in fact, need a higher  pressure, then options would be to either try him on a bilevel positive  airway pressure machine.  Alternatively, we could consider having him  evaluated by [facility] for surgical intervention, not with  the intention to fix the sleep apnea but to improve his sleep apnea to  the point where he could get away with a lower continuous positive  airway pressure setting.  However, ideally, the long term fix again  would be for him to engage in a significant diet, exercise and weight  reduction program and I have discussed this with him in detail.  I had  also emphasized to him the importance of smoking cessation with regards  to his overall health, but specifically with regards to the affect it  can have on his sleep apnea as well.   I plan on following up with him in about 6-8  weeks.     Coralyn Helling, MD  Electronically Signed    VS/MedQ  DD: 10/11/2007  DT: 10/11/2007  Job #: [id number]   cc:   Casimiro Needle B. Sherene Sires, MD, FCCP

## 2011-03-24 NOTE — Assessment & Plan Note (Signed)
[facility]                             PULMONARY OFFICE NOTE   NAME:[name] [name], [name] [name]                    MRN:          [id number]  DATE:06/30/2007                            DOB:          01-27-1961    HISTORY:  A 50 year old black male with morbid obesity, actively smoking  and documented sleep apnea.  He complains of increasing exhaustion  during the day, but feels better after napping in the afternoon.  He is  no longer using CPAP and never returned as recommended for CPAP  titration when we were scheduled for March of this year.  He denies  excessive hypersomnolence trying to drive a car.  He denies any  exertional chest pain, orthopnea, PND, or leg swelling.   PHYSICAL EXAMINATION:  He is a pleasant, ambulatory black male in no  acute distress.  Blood pressure 110/80.  HEENT:  Unremarkable.  Oropharynx is clear.  Lung fields are perfectly clear bilaterally to auscultation and  percussion.  HEART:  Regular rhythm without murmurs, gallops, or rubs.  ABDOMEN:  Soft, benign.  EXTREMITIES:  Warm without calf tenderness, cyanosis, clubbing, or  edema.   IMPRESSION:  1. Morbid obesity complicated by obstructive sleep apnea which was      felt to be severe by his original study done May of 2007.  I told      him it was a medical/legal issue that having documented this, that      he work harder to use the continuous positive airway pressure      machine consistently, and return as recommended for a continuous      positive airway pressure titration trial.  He was also noted to      have significant restless leg syndrome which I had planned to see      if we could eliminate by treating the sleep apnea first.  To      address this plus long-term compliance issues, I have asked him to      see Dr. Craige Cotta after he completes the sleep study.  2. Morbid obesity.  He already has received a weight loss  sheet      emphasizing the importance of improving  calorie balance through      reducing calorie intake and increasing calorie output.  Until he is      less fatigued, he is unlikely, however, to comply with this.  3. Continued smoking against medical advice.  We also discussed this      issue at length.  4. Dyslipidemia.  Note that he has marked reduction in HDL which I      think is directly related to his lifestyle choices and chronic      fatigue preventing him from exercise.  We discussed this issue as      well.     Charlaine Dalton. Sherene Sires, MD, [facility]  Electronically Signed    MBW/MedQ  DD: 06/30/2007  DT: 07/01/2007  Job #: [id number]

## 2011-03-24 NOTE — Assessment & Plan Note (Signed)
[facility]                             PULMONARY OFFICE NOTE   NAME:[name], [name] GODWIN                    MRN:          [id number]  DATE:08/24/2007                            DOB:          [birth year]    HISTORY:  A 50 year old black male, active smoker with morbid obesity  complicated by sleep apnea, in for an acute cough for the last 5 days  associated with sensation of chest congestion, but no significant  dyspnea.  He has discomfort with severe coughing paroxysms in the  anterior chest, and also mild sore throat, but not dysphagia, fevers,  chills, sweats, or lateralizing pleuritic pain or dyspnea.  He tells me  his worst day of his illness was yesterday.  He feels a little bit  better today.   PHYSICAL EXAMINATION:  He is a pleasant ambulatory black male in no  acute distress.  Afebrile with stable vital signs.  HEENT:  Unremarkable.  Oropharynx reveals minimal erythema.  NECK:  Supple without cervical adenopathy or tenderness.  Trachea is  midline.  No thyromegaly.  LUNG FIELDS:  Reveal no significant rhonchi or wheezes.  HEART:  Regular rhythm without murmur, gallop, or rub.  ABDOMEN:  Soft and benign.  EXTREMITIES:  Warm without calf tenderness, cyanosis, clubbing, or  edema.   Hemoglobin saturation 95% on room air.   IMPRESSION:  Acute tracheobronchitis in an active smoker that is  probably viral or possibly an atypical upper airway syndrome.  I  recommended, therefore, doxycycline 100 mg b.i.d. for 7 days, and  Mucinex DM along with Advil Cold and Sinus.  We will see the patient  back here for a comprehensive evaluation in February 2009, sooner if  needed.   In the meantime, I have asked the patient to make a commitment to stop  smoking, and also keep his appointments with Dr. Craige Cotta, his sleep  specialist on record.     Charlaine Dalton. Sherene Sires, MD, [facility]  Electronically Signed    MBW/MedQ  DD: 08/24/2007  DT: 08/25/2007  Job #: [id number]

## 2011-03-27 NOTE — Assessment & Plan Note (Signed)
[facility]                               PULMONARY OFFICE NOTE   NAME:[name], [name] [name]                    MRN:          [id number]  DATE:05/28/2006                            DOB:          [birth year]    This is a primary service/extended follow-up office visit.   HISTORY:  This is a 50 year old black male with morbid obesity complicated  by newly diagnosed obstructive sleep apnea by polysomnography on Mar 09, 2006.  It was recommended that he start on 10 cm of CPAP and return here at  six weeks for followup.  He states that his wife could definitely tell less  snoring was present while wearing the CPAP, and he noticed more energy, but  had trouble keeping up with the maintenance requirements and states he is  only maybe 50% compliant.  Overall, though, he is impressed that it does  help his symptoms of chronic fatigue and mild hypersomnolence.   MEDICATIONS:  He takes one aspirin daily, no other medications.   SOCIAL HISTORY:  He continues to smoke, as does his wife, a patient of Dr.  Posey [name].   PHYSICAL EXAMINATION:  VITAL SIGNS:  His weight is unchanged at 242 versus a  target of less than 181.  Blood pressure 118/74.  GENERAL:  He is an obese, ambulatory black male in no acute distress.  HEENT:  Unremarkable.  Oropharynx clear.  LUNGS:  Lung fields are completely clear bilaterally to auscultation and  percussion.  HEART:  Regular rhythm without murmur, rub or gallop bilaterally.  ABDOMEN:  Soft, benign, but obese.  EXTREMITIES:  Warm without calf tenderness, clubbing, cyanosis or edema.   IMPRESSION:  I had an extended discussion with this patient, lasting 15-20  minutes of a 25 minute visit on the following topics:  1.  Clearly he has obstructive sleep apnea and now has a complication of      obesity, which is his primary medical problem.  I discussed this in the      context of more consistent calorie-balance issues and emphasized  that      this was only the tip of the iceberg of complications that he is      likely to develop if he continues to fail to address the weight issue.  2.  In terms of sleep apnea, he clearly is improved when he wears it, but      like many patients, is only about 50% compliant, which is probably      better than most.  I have recommended a CPAP titration study to see if      we can give him a best CPAP, to see what extent he benefits and      perhaps by doing this, create and improve his daytime function enough to      where he does not want to live without it, which I think is a short-      term goal here, because the more energy he has, the better he will      address problem #1.  3.  Continued smoking  against medical advice, as does his wife.  I have      strongly recommended that they either consider seeing [employer] together or together setting a quit date for Chantix initiation,      but he says he will take this into consideration and discussed it with      his wife.   The followup will be in the context of a comprehensive health care  evaluation in three months.  We will see him sooner if needed with CPAP  titration to be done in the meantime.                                   Charlaine Dalton. Sherene Sires, MD, [facility]   MBW/MedQ  DD:  05/28/2006  DT:  05/28/2006  Job #:  [id number]

## 2011-03-27 NOTE — Procedures (Signed)
NAME:  [name] [name], [name] [name]             ACCOUNT NO.:  [id number]   MEDICAL RECORD NO.:  [id number]          PATIENT TYPE:  OUT   LOCATION:  SLEEP CENTER                 FACILITY:  [facility]   PHYSICIAN:  Coralyn Helling, MD        DATE OF BIRTH:  20-Aug-1961   DATE OF STUDY:                            NOCTURNAL POLYSOMNOGRAM   REFERRING PHYSICIAN:   INDICATION FOR STUDY:  This is an individual who had an overnight  polysomnogram done on 03/09/06, and was found to have severe obstructive  sleep apnea with an apnea/hypopnea index of 43 and an oxygen saturation  nadir of 78% who returns to the sleep lab for a CPAP titration study.   EPWORTH SLEEPINESS SCORE:  Seven.   MEDICATIONS:  None.   SLEEP ARCHITECTURE:  Total recording time was 407 minutes. Total sleep  time is 381 minutes.  Sleep efficiency is 94%.  Sleep latency is 11  minutes.  REM latency is 58 minutes.  The study was notable for lack of  slow-wave sleep. The patient slept in both the supine and nonsupine  position.   RESPIRATORY DATA:  The average respiratory rate was 18.  The patient was  titrated from a CPAP pressure setting of 5 to 22 cm of water.  At a CPAP  pressure setting of 19 cm of water the apnea/hypopnea index was reduced  to 1.4.  At this pressure setting the patient was observed in both REM  sleep and supine sleep, but he still had intermittent episodes of  snoring.   OXYGEN DATA:  The baseline oxygenation was 94%.  The oxygen saturation  nadir was 85%.  At a CPAP pressure setting of 19 cm of water the mean  oxygenation during non-REM sleep was 93%, the mean oxygenation during  REM sleep was 94%, the minimal oxygenation during non-REM sleep was 92%  and the minimal oxygenation during REM sleep was 91%.   CARDIAC DATA:  The rhythm strip showed normal sinus rhythm with a heart  rate of 69.   MOVEMENT-PARASOMNIA:  The periodic limb movement index was 0.   IMPRESSIONS-RECOMMENDATIONS:  This is a CPAP titration study.   The  patient was titrated to a CPAP pressure setting of 19 cm of water.  At  this pressure his apnea/hypopnea index was reduced to 1.4 and he was  observed in both REM sleep and supine sleep although he continued to  have intermediate episodes of snoring. I would recommend setting the  patient on CPAP of 19 cm of water. However, if he is not able to  tolerate this higher pressure, he may benefit from switching to BiPAP.  In addition, it appeared that he was able to tolerate lower  pressures when he was sleeping in the supine position.  Another option  would have him undergo positional therapy in conjunction with CPAP  therapy.      Coralyn Helling, MD  Diplomat, American Board of Sleep Medicine  Electronically Signed     VS/MEDQ  D:  08/09/2007 15:06:45  T:  08/09/2007 18:06:11  Job:  [id number]   cc:   R. Roetta Sessions, M.D.  [address]  [place]  [place] [address]

## 2011-03-27 NOTE — Procedures (Signed)
[name] [name], [name] [name]             ACCOUNT NO.:  [id number]   MEDICAL RECORD NO.:  [id number]          PATIENT TYPE:  OUT   LOCATION:  SLEEP CENTER                 FACILITY:  [facility]   PHYSICIAN:  Marcelyn Bruins, M.D. [facility] DATE OF BIRTH:  05/30/1961   DATE OF STUDY:  03/09/2006                              NOCTURNAL POLYSOMNOGRAM   REFERRING PHYSICIAN:  Dr. Sandrea Hughs   INDICATION FOR THE STUDY:  Hypersomnia with sleep apnea.   EPWORTH SCORE:  3   SLEEP ARCHITECTURE:  The patient has a total sleep time of 337 minutes with  decreased REM and never achieved slow wave sleep.  Sleep onset latency is  mildly prolonged at 36 minutes and REM onset was normal.  Sleep efficiency  was decreased at 79%.   RESPIRATORY DATA:  The patient was found to have 170 hypopneas and 71  obstructive apneas for a respiratory disturbance index of 43 events per  hour.  The events were not positional but there was very loud snoring noted  throughout.   OXYGEN DATA:  There was O2 desaturation as low as 78% with the patient's  obstructive events.   CARDIAC DATA:  Frequent PVCs were noted throughout.   MOVEMENT/PARASOMNIA:  The patient was found to have 183 leg jerks with eight  per hour resulting in arousal or awakening.   IMPRESSION/RECOMMENDATION:  1.  Severe obstructive sleep apnea/hypopnea syndrome with a respiratory      disturbance index of 43 events per hour and oxygen desaturation as low      as 78%.  Treatment for this degree of sleep apnea should focus on weight      loss as well as CPAP.  2.  Frequent premature ventricular contractions noted throughout.  3.  Large numbers of leg jerks with very significant sleep disruption.  It      is unclear whether this is part of the patient's sleep-disordered      breathing or whether he has a concomitant primary movement disorder of      sleep.      Clinical correlation is suggested if the patient fails to respond      appropriately to optimized treatment  of his obstructive sleep apnea.          ______________________________  Marcelyn Bruins, M.D. [facility]  Diplomate, American Board of Sleep  Medicine   KC/MEDQ  D:  03/16/2006 16:49:36  T:  03/17/2006 15:24:16  Job:  [id number]

## 2011-03-27 NOTE — Assessment & Plan Note (Signed)
[facility]                           PRIMARY CARE OFFICE NOTE   NAME:Romm, BARBARA KENG                    MRN:          [id number]  DATE:12/15/2006                            DOB:          10-26-1961    PRIMARY SERVICE COMPREHENSIVE HEALTHCARE EVALUATION   HISTORY:  This is a 50 year old, active smoker, black male with morbid  obesity and a documented obstructive sleep apnea with a positive sleep  study in May of 2007 and sleep titration study recommended after setting  him up with 10 cm of CPAP. He never returned for the CPAP titration  study. He says he is not really consistent about using the Bi-PAP. He  says it is too much trouble to keep it clean and he will use it a few  nights, feel better and then stop using it again. He denies any  excessive hypersomnolence, but does admit that he feels stronger and  more alert with more energy on the days after he uses the Bi-PAP and  denies any typical morning headaches or leg swelling.   PAST MEDICAL HISTORY:  1. Morbid obesity with target weight less than 181.  2. Obstructive sleep apnea secondary to #1.  3. History of epididymitis age 4 with surgical removal of left      testicle.   ALLERGIES:  None known.   CURRENT MEDICATIONS:  Aspirin 325 mg one daily.   SOCIAL HISTORY:  He is an active smoker and works as a Surveyor, minerals.   FAMILY HISTORY:  Is positive for diabetes in mother and father. Mother  was overweight. Developed heart disease at age 1. He is the youngest of  four siblings, all of whom are healthy, except for hypertension. No  cancer in the family to his knowledge.   REVIEW OF SYSTEMS:  Taken in detail on the worksheet and significant for  occasional itching rash perhaps worse in the winter over the last year  and also intermittent erectile dysfunction and  negative except as  outlined above.   PHYSICAL EXAMINATION:  This is an obese, pleasant, ambulatory black male  in no acute  distress. His blood pressure is 106/80 and weight is 246,  which is unchanged from previous visit.  HEENT: Ocular examination was benign with limited funduscopy revealing  no obvious retinal arterial change. Oropharynx is clear. Ear canals  clear bilaterally.  NECK: Supple without cervical adenopathy or tenderness. Trachea is  midline. Carotid upstrokes were brisk without bruits.  CHEST: Was completely clear bilaterally to auscultation and percussion.  HEART: Regular rate and rhythm without murmur, gallop or rub. No  displacement of PMI.  ABDOMEN: Obese, but otherwise benign.  Femoral pulses were present bilaterally. No bruits. There was no aortic  enlargement.  Left testicle was surgical absent. Right testicle was normal with no  evidence of inguinal hernia.  RECTAL: Reveals moderate BPH, smooth texture, stool guaiac was negative.  EXTREMITIES: Were warm without calf tenderness, cyanosis, clubbing or  edema.  NEUROLOGIC: No focal deficits or pathologic reflexes.  SKIN: Examination was warm and dry. He did have one patch of erythema  over his right  shoulder posteriorly associated with dry scaling.   LABORATORY DATA:  EKG was normal. PSA was 1.72. CRP was 9.0. BMET was  normal. LFTs were normal. LDL cholesterol was 116 with an HDL of 27.   IMPRESSION:  1. Morbid obesity has not been addressed by this patient and he has      obstructive sleep apnea that clearly benefits from continuous      positive airway pressure (CPAP). I would like to do a continuous      positive airway pressure (CPAP) titration study at this time and      explained to him in detail why this was necessary. Also, emphasized      the medical/legal issues involved in the treatment of sleep apnea      to emphasize the importance of this issue re: safety (eg driving).  2. Hyperlipidemia with marked reduction in HDL versus LDL that would      likely resolve with regular aerobic exercise. I explained this      issue to  him in detail.  3. Continued smoking against medical advice. I have offered the      services of the Quit Smart and also Chantix if he is ready to quit,      but not able to do so on his own.  4. Surgical absence of the left testicle.  5. Eczematous dermatitis involving the right shoulder. I have      recommended hydrocortisone lotion with followup by his      dermatologist if not responding.  6. Health maintenance. He was updated on tetanus in 2002, Pneumovax in      2004 and therefore needs a Pneumovax in 2009.   Followup will be arranged after sleep titration study available.     Charlaine Dalton. Sherene Sires, MD, [facility]  Electronically Signed    MBW/MedQ  DD: 12/15/2006  DT: 12/15/2006  Job #: [id number]

## 2011-08-24 ENCOUNTER — Encounter: Payer: Self-pay | Admitting: Internal Medicine

## 2011-08-25 ENCOUNTER — Ambulatory Visit (INDEPENDENT_AMBULATORY_CARE_PROVIDER_SITE_OTHER)
Admission: RE | Admit: 2011-08-25 | Discharge: 2011-08-25 | Disposition: A | Discharge status: Home / Self Care | Payer: BC Managed Care – PPO | Source: Ambulatory Visit | Attending: Internal Medicine | Admitting: Internal Medicine

## 2011-08-25 ENCOUNTER — Ambulatory Visit (INDEPENDENT_AMBULATORY_CARE_PROVIDER_SITE_OTHER): Payer: BC Managed Care – PPO | Admitting: Internal Medicine

## 2011-08-25 ENCOUNTER — Other Ambulatory Visit: Payer: Self-pay | Admitting: Internal Medicine

## 2011-08-25 ENCOUNTER — Other Ambulatory Visit (INDEPENDENT_AMBULATORY_CARE_PROVIDER_SITE_OTHER): Payer: BC Managed Care – PPO

## 2011-08-25 ENCOUNTER — Encounter: Payer: Self-pay | Admitting: Internal Medicine

## 2011-08-25 VITALS — BP 120/82 | HR 68 | Temp 98.4°F | Ht 71.0 in | Wt 253.0 lb

## 2011-08-25 DIAGNOSIS — Z0000 Encounter for general adult medical examination without abnormal findings: Secondary | ICD-10-CM

## 2011-08-25 DIAGNOSIS — F17200 Nicotine dependence, unspecified, uncomplicated: Secondary | ICD-10-CM

## 2011-08-25 DIAGNOSIS — Z23 Encounter for immunization: Secondary | ICD-10-CM

## 2011-08-25 DIAGNOSIS — Z87898 Personal history of other specified conditions: Secondary | ICD-10-CM

## 2011-08-25 DIAGNOSIS — E7800 Pure hypercholesterolemia, unspecified: Secondary | ICD-10-CM

## 2011-08-25 DIAGNOSIS — G4733 Obstructive sleep apnea (adult) (pediatric): Secondary | ICD-10-CM

## 2011-08-25 LAB — CBC WITH DIFFERENTIAL/PLATELET
Basophils Relative: 0.4 % (ref 0.0–3.0)
Eosinophils Relative: 2.3 % (ref 0.0–5.0)
Lymphocytes Relative: 23.7 % (ref 12.0–46.0)
MCV: 85 fl (ref 78.0–100.0)
Monocytes Absolute: 0.7 10*3/uL (ref 0.1–1.0)
Monocytes Relative: 7.4 % (ref 3.0–12.0)
Neutrophils Relative %: 66.2 % (ref 43.0–77.0)
Platelets: 302 10*3/uL (ref 150.0–400.0)
RBC: 5.16 Mil/uL (ref 4.22–5.81)
WBC: 9.8 10*3/uL (ref 4.5–10.5)

## 2011-08-25 LAB — LIPID PANEL
LDL Cholesterol: 133 mg/dL — ABNORMAL HIGH (ref 0–99)
Total CHOL/HDL Ratio: 7
Triglycerides: 157 mg/dL — ABNORMAL HIGH (ref 0.0–149.0)

## 2011-08-25 LAB — URINALYSIS, ROUTINE W REFLEX MICROSCOPIC
Bilirubin Urine: NEGATIVE
Urine Glucose: NEGATIVE

## 2011-08-25 LAB — BASIC METABOLIC PANEL
BUN: 15 mg/dL (ref 6–23)
Calcium: 9.2 mg/dL (ref 8.4–10.5)
Chloride: 106 mEq/L (ref 96–112)
Creatinine, Ser: 1 mg/dL (ref 0.4–1.5)
GFR: 96.56 mL/min (ref >60.00)

## 2011-08-25 NOTE — Progress Notes (Signed)
Subjective:    Patient ID: [name] [name], male    DOB: 06/04/1961, 50 y.o.   MRN: [id number]  HPI 50yobm with morbid obesity actively smoking with documented sleep apnea for which he has seen Dr. Craige Cotta with no improvement in his weight over the despite previous extensive counseling regarding calorie balance issues .  April 25, 2009 ov states using cpap 15% and feels better rested on it but having trouble tolerating it. Denies excess daytime hypersomnolence. No sob or cough  August 19, 2010 cpx not using cpap no excess sleepiness, beginning to exercise with wife = walking up to 20 min   08/25/2011 f/u ov/[place] cc CPX, not exercising at all,? excess etoh contributing.  Sleeping ok without nocturnal  or early am exacerbation  of respiratory  c/o's or daytime hypersomnolence off cpap . Also denies any obvious fluctuation of symptoms with weather or environmental changes or other aggravating or alleviating factors except as outlined above.    :  Past Medical History:  HYPERTENSION (ICD-V17.4)  Hx of EPIDIDYMITIS (ICD-604.90) > Left orchiectomy age 1  OBSTRUCTIVE SLEEP APNEA (ICD-327.23)...................................Marland KitchenSood     - Stopped on his own the cpap 2012 MORBID OBESITY (ICD-278.01)  - Target wt = 208 for BMI < 30  Hyperlipidemia male, h/o hbp and borderline dm, pos fm hx= target LDL < 130  HEALTH MAINTENANCE ....................................................................  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  HENT: Negative for hearing loss, ear pain, nosebleeds, congestion, sore throat, facial swelling, rhinorrhea, sneezing, mouth sores, trouble swallowing, neck pain, neck stiffness, dental problem, voice change, postnasal drip, sinus pressure, tinnitus and ear discharge.   Eyes: Negative for photophobia, discharge, itching and visual disturbance.  Respiratory: Negative for apnea, cough, choking, chest tightness, shortness of breath, wheezing and stridor.   Cardiovascular: Negative for chest pain, palpitations and leg swelling.  Gastrointestinal: Negative for nausea, vomiting, abdominal pain, constipation, blood in stool and abdominal distention.  Genitourinary: Positive for frequency. Negative for dysuria, urgency, hematuria, flank pain, decreased urine volume and difficulty urinating.  Musculoskeletal: Positive for arthralgias. Negative for myalgias, back pain, joint swelling and gait problem.  Skin: Negative for color change, pallor and rash.  Neurological: Negative for dizziness, tremors, seizures, syncope, speech difficulty, weakness, light-headedness, numbness and headaches.  Hematological: Negative for adenopathy. Does not bruise/bleed easily.  Psychiatric/Behavioral: Negative for confusion, sleep disturbance and agitation. The patient is not nervous/anxious.        Objective:   Physical Exam    mb pleasant obese bm nad  250 > 242 April 25, 2009 > 250 August 19, 2010 > 08/25/2011  253 HEENT: nl dentition, turbinates, and orophanx. Nl external ear canals without cough reflex  NECK : without JVD/Nodes/TM/ nl carotid upstrokes bilaterally  LUNGS: no acc muscle use, clear to A and P bilaterally without cough on insp or exp maneuvers  CV: RRR no s3 or murmur or increase in P2, no edema pedal pulses sym  ABD:  soft and nontender with nl excursion in the supine position. No bruits or organomegaly, bowel sounds nl  MS: warm without  deformities, calf tenderness, cyanosis or clubbing  SKIN: warm and dry without lesions  NEURO: alert, approp, no deficits  GU: L testicle surgically absent , R nl  RECTAL: mod to severe bph stool g -   cxr 08/25/11 Minimal bronchitic changes and basilar atelectasis. Question left thyroid lobe enlargement or mass, unchanged since 2009.      Assessment & Plan:

## 2011-08-25 NOTE — Patient Instructions (Signed)
We will call you with lab results  Weight control is simply a matter of calorie balance which needs to be tilted in your favor by eating less and exercising more.  To get the most out of exercise, you need to be continuously aware that you are short of breath, but never out of breath, for 30 minutes daily. As you improve, it will actually be easier for you to do the same amount of exercise  in  30 minutes so always push to the level where you are short of breath.  If this does not result in gradual weight reduction then I strongly recommend you see a nutritionist with a food diary x 2 weeks so that we can work out a negative calorie balance which is universally effective in steady weight loss programs.  Think of your calorie balance like you do your bank account where in this case you want the balance to go down so you must take in less calories than you burn up.  It's just that simple:  Hard to do, but easy to understand.  Good luck!   Return yearly for cpx

## 2011-08-26 ENCOUNTER — Encounter: Payer: Self-pay | Admitting: Internal Medicine

## 2011-08-26 LAB — HEPATIC FUNCTION PANEL
ALT: 37 U/L (ref 0–53)
AST: 29 U/L (ref 0–37)
Alkaline Phosphatase: 84 U/L (ref 39–117)
Bilirubin, Direct: 0.2 mg/dL (ref 0.0–0.3)
Total Bilirubin: 0.8 mg/dL (ref 0.3–1.2)
Total Protein: 7.1 g/dL (ref 6.0–8.3)

## 2011-08-26 NOTE — Assessment & Plan Note (Signed)
Not Adequate control on present rx, reviewed but just needs wt loss for now

## 2011-08-26 NOTE — Assessment & Plan Note (Signed)
Cal balance issues discussed again

## 2011-08-26 NOTE — Assessment & Plan Note (Signed)
Discussed the risks and costs (both direct and indirect)  of smoking relative to the benefits of quitting but patient unwilling to commit at this point to a specific quit date.    Offered to help with quitting by use of chantix or referral to our Quit Smart program when the patient is ready.  

## 2011-08-26 NOTE — Assessment & Plan Note (Signed)
Since not symptomatic unlikely to resume use, work on wt loss

## 2012-06-03 ENCOUNTER — Ambulatory Visit: Payer: BC Managed Care – PPO | Admitting: Pulmonary Disease

## 2012-06-03 ENCOUNTER — Telehealth: Payer: Self-pay | Admitting: Internal Medicine

## 2012-06-03 NOTE — Telephone Encounter (Signed)
I spoke with spouse and she states pt is very congested and coughing a lot. Pt is scheduled to come in for acute visit with VS this afternoon at 2:15.

## 2012-06-06 ENCOUNTER — Ambulatory Visit (INDEPENDENT_AMBULATORY_CARE_PROVIDER_SITE_OTHER)
Admission: RE | Admit: 2012-06-06 | Discharge: 2012-06-06 | Disposition: A | Discharge status: Home / Self Care | Payer: BC Managed Care – PPO | Source: Ambulatory Visit | Attending: Internal Medicine | Admitting: Internal Medicine

## 2012-06-06 ENCOUNTER — Encounter: Payer: Self-pay | Admitting: Internal Medicine

## 2012-06-06 ENCOUNTER — Ambulatory Visit (INDEPENDENT_AMBULATORY_CARE_PROVIDER_SITE_OTHER): Payer: BC Managed Care – PPO | Admitting: Internal Medicine

## 2012-06-06 VITALS — BP 136/82 | HR 73 | Temp 98.0°F | Ht 71.0 in | Wt 260.0 lb

## 2012-06-06 DIAGNOSIS — E049 Nontoxic goiter, unspecified: Secondary | ICD-10-CM

## 2012-06-06 DIAGNOSIS — F17200 Nicotine dependence, unspecified, uncomplicated: Secondary | ICD-10-CM

## 2012-06-06 DIAGNOSIS — R059 Cough, unspecified: Secondary | ICD-10-CM | POA: Insufficient documentation

## 2012-06-06 DIAGNOSIS — R051 Acute cough: Secondary | ICD-10-CM | POA: Insufficient documentation

## 2012-06-06 DIAGNOSIS — R05 Cough: Secondary | ICD-10-CM

## 2012-06-06 MED ORDER — PREDNISONE (PAK) 10 MG PO TABS: ORAL_TABLET | ORAL | Status: AC

## 2012-06-06 MED ORDER — DOXYCYCLINE HYCLATE 100 MG PO TABS: 100.0000 mg | ORAL_TABLET | Freq: Two times a day (BID) | ORAL | Status: AC

## 2012-06-06 NOTE — Patient Instructions (Addendum)
Prednisone 10 mg take  4 each am x 2 days,   2 each am x 2 days,  1 each am x2days and stop  Doxycycline 100 mg twice daily  Pepcid ac 20 mg (over the counter) take it after bfast and before bed until cough gone  mucinex dm 2 every 12 hours as needed  The key is to stop smoking completely before smoking completely stops you!   Late add:  Ordered thyroid u/s

## 2012-06-06 NOTE — Assessment & Plan Note (Signed)
Discussed with Dr Everardo All > start with u/s of thyroid

## 2012-06-06 NOTE — Progress Notes (Signed)
Subjective:    Patient ID: [name] [name], male    DOB: [birth year]   MRN: [id number]  Brief patient profile:  51yobm with morbid obesity actively smoking with documented sleep apnea for which he has seen Dr. Craige Cotta with no improvement in his weight over the despite previous extensive counseling regarding calorie balance issues   April 25, 2009 ov states using cpap 15% and feels better rested on it but having trouble tolerating it. Denies excess daytime hypersomnolence. No sob or cough  August 19, 2010 cpx not using cpap no excess sleepiness, beginning to exercise with wife = walking up to 20 min   08/25/2011 f/u ov/[name] [name] cc CPX, not exercising at all,? excess etoh contributing.  Sleeping ok without nocturnal  or early am exacerbation  of respiratory  c/o's or daytime hypersomnolence off cpap .  rec Work on wt  06/06/2012 f/u ov/[name] [name] still smoking  cc Pt c/o nasal and chest congestion x 6 days. He also c/o prod cough with minimal thick, clear sputum, no sob. No sinus pain, sorethroat.  Sleeping ok without nocturnal  or early am exacerbation  of respiratory  c/o's  . Also denies any obvious fluctuation of symptoms with weather or environmental changes or other aggravating or alleviating factors except as outlined above   ROS  The following are not active complaints unless bolded sore throat, dysphagia, dental problems, itching, sneezing,  nasal congestion or excess/ purulent secretions, ear ache,   fever, chills, sweats, unintended wt loss, pleuritic or exertional cp, hemoptysis,  orthopnea pnd or leg swelling, presyncope, palpitations, heartburn, abdominal pain, anorexia, nausea, vomiting, diarrhea  or change in bowel or urinary habits, change in stools or urine, dysuria,hematuria,  rash, arthralgias, visual complaints, headache, numbness weakness or ataxia or problems with walking or coordination,  change in mood/affect or memory.         Past Medical History:  HYPERTENSION (ICD-V17.4)  Hx  of EPIDIDYMITIS (ICD-604.90) > Left orchiectomy age 43  OBSTRUCTIVE SLEEP APNEA (ICD-327.23)...................................Marland KitchenSood     - Stopped on his own the cpap 2012 MORBID OBESITY (ICD-278.01)  - Target wt = 208 for BMI < 30  Hyperlipidemia male, h/o hbp and borderline dm, pos fm hx= target LDL < 130  HEALTH MAINTENANCE ....................................................................  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  wt 242 April 25, 2009 > 250 August 19, 2010 > 08/25/2011  253 > 06/06/2012  260 HEENT: nl dentition, turbinates, and orophanx. Nl external ear canals without cough reflex  NECK : without JVD/Nodes/TM/ nl carotid upstrokes bilaterally - I could not appreciate a goiter or tracheal deviation. LUNGS: no acc muscle use, clear to A and P bilaterally without cough on insp or exp maneuvers  CV: RRR no s3 or murmur or increase in P2, no edema pedal pulses sym  ABD: soft and nontender with nl excursion in the supine position. No bruits or organomegaly, bowel sounds nl  MS: warm without deformities,  calf tenderness, cyanosis or clubbing  SKIN: warm and dry without lesions      CXR  06/06/2012 :  1. No acute cardiopulmonary abnormalities. 2. Low lung volumes.         Assessment & Plan:

## 2012-06-06 NOTE — Assessment & Plan Note (Signed)
Acute onset c/w bronchitis in smoker > rx Prednisone 10 mg take  4 each am x 2 days,   2 each am x 2 days,  1 each am x2days and stop and doxy.  Smoking discussed separately.

## 2012-06-06 NOTE — Assessment & Plan Note (Signed)
Discussed the risks and costs (both direct and indirect)  of smoking relative to the benefits of quitting but patient unwilling to commit at this point to a specific quit date.    Offered to help with quitting by use of chantix or referral to our [employer] when the patient is ready.  His wife is also smoking so ideally need a commitment from both.

## 2012-06-08 ENCOUNTER — Encounter: Payer: Self-pay | Admitting: Internal Medicine

## 2012-06-08 ENCOUNTER — Telehealth: Payer: Self-pay | Admitting: Internal Medicine

## 2012-06-08 DIAGNOSIS — E049 Nontoxic goiter, unspecified: Secondary | ICD-10-CM

## 2012-06-08 NOTE — Telephone Encounter (Signed)
Notes Recorded by Nyoka Cowden, MD on 06/06/2012 at 1:44 PM Call pt: Reviewed cxr and no acute change so no change in recommendations made at ov  Although the findings of enlarged thyroid gland go back at least 4 years, I would go ahead and do a thyroid ultrasound just to get a baseline size for future reference as I could not feel it on exam to assess it serially over time and eventually we may want to send him to our thyroid specialist if it starts to bother him.   Refer for thyroid u/s Dx: ? Goiter ?   I spoke with patient about results and he verbalized understanding and had no questions. I have placed order and pt awre he will be contacted with an appt.

## 2012-06-09 ENCOUNTER — Other Ambulatory Visit: Payer: BC Managed Care – PPO

## 2012-06-13 ENCOUNTER — Ambulatory Visit
Admission: RE | Admit: 2012-06-13 | Discharge: 2012-06-13 | Disposition: A | Discharge status: Home / Self Care | Payer: BC Managed Care – PPO | Source: Ambulatory Visit | Attending: Internal Medicine | Admitting: Internal Medicine

## 2012-06-13 ENCOUNTER — Other Ambulatory Visit: Payer: Self-pay | Admitting: Internal Medicine

## 2012-06-13 DIAGNOSIS — E049 Nontoxic goiter, unspecified: Secondary | ICD-10-CM

## 2012-06-14 ENCOUNTER — Other Ambulatory Visit: Payer: Self-pay | Admitting: Internal Medicine

## 2012-06-14 DIAGNOSIS — E049 Nontoxic goiter, unspecified: Secondary | ICD-10-CM

## 2012-06-21 ENCOUNTER — Inpatient Hospital Stay
Admission: RE | Admit: 2012-06-21 | Discharge: 2012-06-21 | Payer: BC Managed Care – PPO | Source: Ambulatory Visit | Attending: Internal Medicine | Admitting: Internal Medicine

## 2012-06-21 ENCOUNTER — Other Ambulatory Visit: Payer: BC Managed Care – PPO

## 2012-06-28 ENCOUNTER — Other Ambulatory Visit (HOSPITAL_COMMUNITY)
Admission: RE | Admit: 2012-06-28 | Discharge: 2012-06-28 | Disposition: A | Discharge status: Home / Self Care | Payer: BC Managed Care – PPO | Source: Ambulatory Visit | Attending: Interventional Radiology | Admitting: Interventional Radiology

## 2012-06-28 ENCOUNTER — Ambulatory Visit
Admission: RE | Admit: 2012-06-28 | Discharge: 2012-06-28 | Disposition: A | Discharge status: Home / Self Care | Payer: BC Managed Care – PPO | Source: Ambulatory Visit | Attending: Internal Medicine | Admitting: Internal Medicine

## 2012-06-28 DIAGNOSIS — E049 Nontoxic goiter, unspecified: Secondary | ICD-10-CM | POA: Insufficient documentation

## 2012-06-30 ENCOUNTER — Encounter: Payer: Self-pay | Admitting: Internal Medicine

## 2012-07-04 ENCOUNTER — Other Ambulatory Visit: Payer: Self-pay | Admitting: Internal Medicine

## 2012-07-04 DIAGNOSIS — E049 Nontoxic goiter, unspecified: Secondary | ICD-10-CM

## 2012-07-04 NOTE — Progress Notes (Signed)
Quick Note:  Spoke with pt and notified of results per Dr. Wert. Pt verbalized understanding and denied any questions.  ______ 

## 2012-07-14 ENCOUNTER — Ambulatory Visit: Payer: BC Managed Care – PPO | Admitting: Endocrinology

## 2012-07-14 DIAGNOSIS — Z0289 Encounter for other administrative examinations: Secondary | ICD-10-CM

## 2012-09-06 ENCOUNTER — Ambulatory Visit (INDEPENDENT_AMBULATORY_CARE_PROVIDER_SITE_OTHER)
Admission: RE | Admit: 2012-09-06 | Discharge: 2012-09-06 | Disposition: A | Discharge status: Home / Self Care | Payer: BC Managed Care – PPO | Source: Ambulatory Visit | Attending: Internal Medicine | Admitting: Internal Medicine

## 2012-09-06 ENCOUNTER — Encounter: Payer: Self-pay | Admitting: Internal Medicine

## 2012-09-06 ENCOUNTER — Ambulatory Visit (INDEPENDENT_AMBULATORY_CARE_PROVIDER_SITE_OTHER): Payer: BC Managed Care – PPO | Admitting: Internal Medicine

## 2012-09-06 ENCOUNTER — Other Ambulatory Visit (INDEPENDENT_AMBULATORY_CARE_PROVIDER_SITE_OTHER): Payer: BC Managed Care – PPO

## 2012-09-06 VITALS — BP 122/82 | HR 86 | Temp 97.9°F | Ht 71.0 in | Wt 260.0 lb

## 2012-09-06 DIAGNOSIS — Z0000 Encounter for general adult medical examination without abnormal findings: Secondary | ICD-10-CM

## 2012-09-06 DIAGNOSIS — M5430 Sciatica, unspecified side: Secondary | ICD-10-CM

## 2012-09-06 DIAGNOSIS — E049 Nontoxic goiter, unspecified: Secondary | ICD-10-CM

## 2012-09-06 DIAGNOSIS — Z87898 Personal history of other specified conditions: Secondary | ICD-10-CM

## 2012-09-06 DIAGNOSIS — G4733 Obstructive sleep apnea (adult) (pediatric): Secondary | ICD-10-CM

## 2012-09-06 DIAGNOSIS — F17200 Nicotine dependence, unspecified, uncomplicated: Secondary | ICD-10-CM

## 2012-09-06 DIAGNOSIS — E7800 Pure hypercholesterolemia, unspecified: Secondary | ICD-10-CM

## 2012-09-06 LAB — BASIC METABOLIC PANEL
Chloride: 105 mEq/L (ref 96–112)
Creatinine, Ser: 1 mg/dL (ref 0.4–1.5)

## 2012-09-06 LAB — HEPATIC FUNCTION PANEL
ALT: 35 U/L (ref 0–53)
AST: 25 U/L (ref 0–37)
Alkaline Phosphatase: 80 U/L (ref 39–117)
Bilirubin, Direct: 0.1 mg/dL (ref 0.0–0.3)
Total Protein: 7.5 g/dL (ref 6.0–8.3)

## 2012-09-06 LAB — URINALYSIS
Leukocytes, UA: NEGATIVE
Specific Gravity, Urine: 1.025 (ref 1.000–1.030)
Urobilinogen, UA: 0.2 (ref 0.0–1.0)

## 2012-09-06 LAB — CBC WITH DIFFERENTIAL/PLATELET
Basophils Relative: 0.2 % (ref 0.0–3.0)
Eosinophils Relative: 1.8 % (ref 0.0–5.0)
Lymphocytes Relative: 18.2 % (ref 12.0–46.0)
MCV: 84.1 fl (ref 78.0–100.0)
Monocytes Absolute: 0.8 10*3/uL (ref 0.1–1.0)
Monocytes Relative: 7.2 % (ref 3.0–12.0)
Neutrophils Relative %: 72.6 % (ref 43.0–77.0)
RBC: 5.3 Mil/uL (ref 4.22–5.81)
WBC: 10.9 10*3/uL — ABNORMAL HIGH (ref 4.5–10.5)

## 2012-09-06 LAB — LIPID PANEL
LDL Cholesterol: 126 mg/dL — ABNORMAL HIGH (ref 0–99)
Total CHOL/HDL Ratio: 8

## 2012-09-06 NOTE — Progress Notes (Signed)
Subjective:    Patient ID: [name] [name], male    DOB: [birth year]   MRN: [id number]  Brief patient profile:  51yobm with morbid obesity actively smoking with documented sleep apnea for which he has seen Dr. Craige Cotta with no improvement in his weight over the despite previous extensive counseling regarding calorie balance issues   April 25, 2009 ov states using cpap 15% and feels better rested on it but having trouble tolerating it. Denies excess daytime hypersomnolence. No sob or cough    08/25/2011 f/u ov/[place] cc CPX, not exercising at all,? excess etoh contributing.  Sleeping ok without nocturnal  or early am exacerbation  of respiratory  c/o's or daytime hypersomnolence off cpap .  rec Work on wt  06/06/2012 f/u ov/[place] still smoking  cc Pt c/o nasal and chest congestion x 6 days. He also c/o prod cough with minimal thick, clear sputum, no sob. No sinus pain, sorethroat. rec Prednisone 10 mg take  4 each am x 2 days,   2 each am x 2 days,  1 each am x2days and stop Doxycycline 100 mg twice daily Pepcid ac 20 mg (over the counter) take it after bfast and before bed until cough gone mucinex dm 2 every 12 hours as needed The key is to stop smoking completely before smoking completely stops you!  Late add:  Ordered thyroid u/s> bx > benign> referred to [facility] for goiter rx> canceled appt  09/06/2012 f/u ov/[place] cc new onset variable intensity daily pain shooting down L leg x sev weeks, some better with aleve, better if leaves wallet out of pock, rad hip knee and ankle. No pain with cough or weak, numbness in leg.  Also chronic fatigue s hypersomnolence, unable to lose wt.  Sleeping ok without nocturnal  or early am exacerbation  of respiratory  c/o's  . Also denies any obvious fluctuation of symptoms with weather or environmental changes or other aggravating or alleviating factors except as outlined above   ROS  The following are not active complaints unless bolded sore throat, dysphagia,  dental problems, itching, sneezing,  nasal congestion or excess/ purulent secretions, ear ache,   fever, chills, sweats, unintended wt loss, pleuritic or exertional cp, hemoptysis,  orthopnea pnd or leg swelling, presyncope, palpitations, heartburn, abdominal pain, anorexia, nausea, vomiting, diarrhea  or change in bowel or urinary habits, change in stools or urine, dysuria,hematuria,  rash, arthralgias, visual complaints, headache, numbness weakness or ataxia or problems with walking or coordination,  change in mood/affect or memory.         Past Medical History:  HYPERTENSION (ICD-V17.4)  Hx of EPIDIDYMITIS (ICD-604.90) > Left orchiectomy age 29  OBSTRUCTIVE SLEEP APNEA (ICD-327.23)...................................Marland KitchenSood     - Stopped   cpap 2012 on his own    - Sleep titration ordered 09/06/2012 due to excess fatigue MORBID OBESITY (ICD-278.01)  - Target wt = 208 for BMI < 30  Hyperlipidemia male, h/o hbp and borderline dm, pos fm hx= target LDL < 130  HEALTH MAINTENANCE ....................................................................  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  wt 242 April 25, 2009 > 250 August 19, 2010 > 08/25/2011  253 > 06/06/2012  260 > 260 09/06/2012  HEENT: nl dentition, turbinates, and oropharnx -external ear canals wax on L NECK : without JVD/Nodes/TM/ nl carotid upstrokes bilaterally - I could not appreciate a goiter or tracheal deviation. LUNGS: no acc muscle use, clear to A and P  bilaterally without cough on insp or exp maneuvers  CV: RRR no s3 or murmur or increase in P2, no edema pedal pulses sym  ABD: soft and nontender with nl excursion in the supine position. No bruits or organomegaly, bowel sounds nl  MS: warm without deformities, calf tenderness, cyanosis or clubbing - LSR on L neg SKIN: warm and dry without lesions  NEURO:  Nl sensorium, no deficits.      CXR  09/06/2012 : No active disease. No significant change No change trachea pushed to R         Assessment & Plan:

## 2012-09-06 NOTE — Assessment & Plan Note (Signed)
Discussed the risks and costs (both direct and indirect)  of smoking relative to the benefits of quitting but patient unwilling to commit at this point to a specific quit date.    Offered to help with quitting by use of chantix or referral to our Quit Smart program when the patient is ready.  

## 2012-09-06 NOTE — Assessment & Plan Note (Signed)
Lab Results  Component Value Date   CHOL 180 09/06/2012   HDL 23.00* 09/06/2012   LDLCALC 126* 09/06/2012   LDLDIRECT 139.5 08/19/2010   TRIG 157.0* 09/06/2012   CHOLHDL 8 09/06/2012     Main issue is low hdl, needs more consistent ex program

## 2012-09-06 NOTE — Assessment & Plan Note (Signed)
Discussed risk benefit of prostate screening > pt deferred for now

## 2012-09-06 NOTE — Assessment & Plan Note (Signed)
-   Target wt < 208 to get BMI under 30    - Referred to nutrition 09/06/2012   See instructions for specific recommendations which were reviewed directly with the patient who was given a copy with highlighter outlining the key components. This included calorie balance issues

## 2012-09-06 NOTE — Assessment & Plan Note (Signed)
-   Stopped   cpap 2012 on his own    - Sleep titration ordered 09/06/2012 due to excess fatigue

## 2012-09-06 NOTE — Assessment & Plan Note (Signed)
-   Present since at least 2009 radiographically, not described in 12/2006    - Thyroid u/s 06/13/12 >  Asym nodules L > R bxs recommended 06/28/2012 >>> both benign > refer to [facility] 06/30/2012 > pt canceled> referred again 09/06/2012

## 2012-09-06 NOTE — Patient Instructions (Addendum)
Please see patient coordinator before you leave today  to schedule nutritionist, orthopedic eval, ellison eval, sleep titration study  Please remember to go to the lab and x-ray department downstairs for your tests - we will call you with the results when they are available.  Weight control is simply a matter of calorie balance which needs to be tilted in your favor by eating less and exercising more.  To get the most out of exercise, you need to be continuously aware that you are short of breath, but never out of breath, for 30 minutes daily. As you improve, it will actually be easier for you to do the same amount of exercise  in  30 minutes so always push to the level where you are short of breath.   Return yearly for comprehensive evaluation, sooner if needed  Late add Given the number of loosed ends (including prostate exam) rec ov in 3 months - placed in tickle file

## 2012-09-06 NOTE — Assessment & Plan Note (Signed)
No evidence of def radiculopathy but pattern does suggest sciatic nerve involvement > rx nsaids and refer to ortho

## 2012-09-07 NOTE — Progress Notes (Signed)
Quick Note:  Spoke with pt and notified of results per Dr. Wert. Pt verbalized understanding and denied any questions.  ______ 

## 2012-09-21 ENCOUNTER — Ambulatory Visit (INDEPENDENT_AMBULATORY_CARE_PROVIDER_SITE_OTHER): Payer: BC Managed Care – PPO | Admitting: Endocrinology

## 2012-09-21 ENCOUNTER — Encounter: Payer: Self-pay | Admitting: Endocrinology

## 2012-09-21 VITALS — BP 122/74 | HR 83 | Temp 97.7°F | Wt 262.0 lb

## 2012-09-21 DIAGNOSIS — E042 Nontoxic multinodular goiter: Secondary | ICD-10-CM

## 2012-09-21 NOTE — Patient Instructions (Addendum)
Please return in 1 year, when you will be due to a follow-up ultrasound. i am uncertain if your symptoms are coming from the thyroid.  If so, the only available treatment would be to have the thyroid surgically removed.  However, i don't think you need to have that done. There is no medication which would effectively shrink the thyroid.  most of the time, a "lumpy thyroid" will eventually become overactive.  this is usually a slow process, happening over the span of many years.

## 2012-09-21 NOTE — Progress Notes (Signed)
Subjective:    Patient ID: [name] [name], male    DOB: 01-02-1961, 51 y.o.   MRN: [id number]  HPI Pt was incidentally noted a few mos ago on a cxr to have a goiter.  He says he has never been known to have had a thyroid problem in the past. He reports a few years of intermittent mild sensation in the neck ("things go down the wrong way").  No assoc pain.   Past Medical History  Diagnosis Date  . Hypertension   . Epididymitis   . OSA (obstructive sleep apnea)   . Morbid obesity   . Hyperlipidemia     Past Surgical History  Procedure Date  . Testicle removal age 86    History   Social History  . Marital Status: Married    Spouse Name: N/A    Number of Children: N/A  . Years of Education: N/A   Occupational History  . Retired    Social History Main Topics  . Smoking status: Current Every Day Smoker -- 0.5 packs/day for 25 years    Types: Cigarettes  . Smokeless tobacco: Never Used  . Alcohol Use: 1.5 - 2.0 oz/week    3-4 drink(s) per week  . Drug Use: No  . Sexually Active: Not on file   Other Topics Concern  . Not on file   Social History Narrative  . No narrative on file    Current Outpatient Prescriptions on File Prior to Visit  Medication Sig Dispense Refill  . aspirin 81 MG tablet Take 81 mg by mouth every 3 (three) days.      . naproxen sodium (ANAPROX) 220 MG tablet Take 220 mg by mouth as needed.        No Known Allergies  Family History  Problem Relation Age of Onset  . Coronary artery disease Mother 39  . Diabetes Mother   . Diabetes Father   . Diabetes Brother     older brother  . Cancer Neg Hx   no goiter or other thyroid probs  BP 122/74  Pulse 83  Temp 97.7 F (36.5 C) (Oral)  Wt 262 lb (118.842 kg)  SpO2 98%  Review of Systems denies depression, hair loss, cramps, sob, memory loss, constipation, numbness, blurry vision, dry skin, rhinorrhea, easy bruising, and syncope.  He reports weight gain and arthralgias.     Objective:   Physical Exam VS: see vs page GEN: no distress HEAD: head: no deformity eyes: no periorbital swelling, no proptosis external nose and ears are normal mouth: no lesion seen NECK: i cannot palpate the goiter CHEST WALL: no deformity LUNGS: clear to auscultation BREASTS:  No gynecomastia CV: reg rate and rhythm, no murmur ABD: abdomen is soft, nontender.  no hepatosplenomegaly.  not distended.  no hernia MUSCULOSKELETAL: muscle bulk and strength are grossly normal.  no obvious joint swelling.  gait is normal and steady EXTEMITIES: no deformity.  no ulcer on the feet.  feet are of normal color and temp.  no edema PULSES: dorsalis pedis intact bilat.  no carotid bruit NEURO:  cn 2-12 grossly intact.   readily moves all 4's.  sensation is intact to touch on the feet SKIN:  Normal texture and temperature.  No rash or suspicious lesion is visible.   NODES:  None palpable at the neck PSYCH: alert, oriented x3.  Does not appear anxious nor depressed.   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 Weight gain, not thyroid-related

## 2012-09-26 ENCOUNTER — Other Ambulatory Visit: Payer: Self-pay | Admitting: Orthopedic Surgery

## 2012-09-26 DIAGNOSIS — M545 Low back pain: Secondary | ICD-10-CM

## 2012-09-26 DIAGNOSIS — R531 Weakness: Secondary | ICD-10-CM

## 2012-10-01 ENCOUNTER — Other Ambulatory Visit: Payer: BC Managed Care – PPO

## 2012-10-07 ENCOUNTER — Other Ambulatory Visit: Payer: BC Managed Care – PPO

## 2012-10-12 ENCOUNTER — Ambulatory Visit (HOSPITAL_BASED_OUTPATIENT_CLINIC_OR_DEPARTMENT_OTHER): Payer: BC Managed Care – PPO

## 2012-10-27 ENCOUNTER — Institutional Professional Consult (permissible substitution): Payer: BC Managed Care – PPO | Admitting: Pulmonary Disease

## 2013-02-28 ENCOUNTER — Encounter: Payer: Self-pay | Admitting: Adult Health

## 2013-02-28 ENCOUNTER — Ambulatory Visit (INDEPENDENT_AMBULATORY_CARE_PROVIDER_SITE_OTHER): Payer: BC Managed Care – PPO | Admitting: Adult Health

## 2013-02-28 VITALS — BP 128/76 | HR 75 | Temp 98.6°F | Ht 71.0 in | Wt 262.2 lb

## 2013-02-28 DIAGNOSIS — J209 Acute bronchitis, unspecified: Secondary | ICD-10-CM | POA: Insufficient documentation

## 2013-02-28 MED ORDER — CEFDINIR 300 MG PO CAPS: 300.0000 mg | ORAL_CAPSULE | Freq: Two times a day (BID) | ORAL | Status: DC

## 2013-02-28 NOTE — Assessment & Plan Note (Signed)
Flare  Smoking cessation encouraged   Plan  Omnicef 300mg  Twice daily  For 7 days  Mucinex DM Twice daily  As needed  Cough/congestion  Saline nasal rinses As needed   Zyrtec 10mg  At bedtime  As needed  Drainage  Fluids and rest  Please contact office for sooner follow up if symptoms do not improve or worsen or seek emergency care  follow up Dr. Sherene Sires  In 6 months for physical .

## 2013-02-28 NOTE — Progress Notes (Signed)
Subjective:    Patient ID: [name] [name], male    DOB: Sep 28, 1961   MRN: [id number]  Brief patient profile:  52yobm with morbid obesity actively smoking with documented sleep apnea for which he has seen Dr. Craige Cotta with no improvement in his weight over the despite previous extensive counseling regarding calorie balance issues   April 25, 2009 ov states using cpap 15% and feels better rested on it but having trouble tolerating it. Denies excess daytime hypersomnolence. No sob or cough    08/25/2011 f/u ov/Wert cc CPX, not exercising at all,? excess etoh contributing.  Sleeping ok without nocturnal  or early am exacerbation  of respiratory  c/o's or daytime hypersomnolence off cpap .  rec Work on wt  06/06/2012 f/u ov/Wert still smoking  cc Pt c/o nasal and chest congestion x 6 days. He also c/o prod cough with minimal thick, clear sputum, no sob. No sinus pain, sorethroat. rec Prednisone 10 mg take  4 each am x 2 days,   2 each am x 2 days,  1 each am x2days and stop Doxycycline 100 mg twice daily Pepcid ac 20 mg (over the counter) take it after bfast and before bed until cough gone mucinex dm 2 every 12 hours as needed The key is to stop smoking completely before smoking completely stops you!  Late add:  Ordered thyroid u/s> bx > benign> referred to [facility] for [facility]iter rx> canceled appt  09/06/2012 f/u ov/Wert cc new onset variable intensity daily pain shooting down L leg x sev weeks, some better with aleve, better if leaves wallet out of pock, rad hip knee and ankle. No pain with cough or weak, numbness in leg.  Also chronic fatigue s hypersomnolence, unable to lose wt. >>refer to ortho , endocrinology for goiter and dietician  02/28/2013 Acute OV  Complains of bilateral ear congestion, left worse than right; head congestion with yellow/clear mucus, PND, cough x2-3weeks - denies f/c/s, SOB, wheezing, tightness. Taking mucinex and zyrtec  without much help.  Has a lot of ear pressure and  stuffiness.  No fever, chest pain, edema , n/v, travel or abx use.   Back and Hips  improved from last ov, seen by ortho , Dx w/ strain , tx w/ NSAID and flexeril .  Did not go to [facility].  Not on CPAP , returned it to DME         Past Medical History:  HYPERTENSION (ICD-V17.4)  Hx of EPIDIDYMITIS (ICD-604.90) > Left orchiectomy age 37  OBSTRUCTIVE SLEEP APNEA (ICD-327.23)...................................Marland KitchenSood     - Stopped   cpap 2012 on his own    - Sleep titration ordered 09/06/2012 due to excess fatigue MORBID OBESITY (ICD-278.01)  - Target wt = 208 for BMI < 30  Hyperlipidemia male, h/o hbp and borderline dm, pos fm hx= target LDL < 130  HEALTH MAINTENANCE ....................................................................  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  wt  242 April 25, 2009 > 250 August 19, 2010 > 08/25/2011  253 > 06/06/2012  260 > 260 09/06/2012 >262 02/28/2013  HEENT: nl dentition, turbinates, and oropharnx -external ear canals wax on L-mild  NECK : without JVD/Nodes/TM/ nl carotid upstrokes bilaterally - LUNGS: no acc muscle use, clear to A and P bilaterally without cough on insp or exp maneuvers  CV: RRR no s3 or murmur or increase in P2, no edema pedal pulses sym  ABD: soft and nontender with nl excursion in the supine position. No bruits or organomegaly, bowel sounds nl  MS: warm without deformities, calf tenderness, cyanosis or clubbing   SKIN: warm and dry without lesions  NEURO:no  deficits noted      CXR  09/06/2012 : No active disease. No significant change No change trachea pushed to R         Assessment & Plan:

## 2013-02-28 NOTE — Patient Instructions (Addendum)
Omnicef 300mg  Twice daily  For 7 days  Mucinex DM Twice daily  As needed  Cough/congestion  Saline nasal rinses As needed   Zyrtec 10mg  At bedtime  As needed  Drainage  Fluids and rest  Please contact office for sooner follow up if symptoms do not improve or worsen or seek emergency care  follow up Dr. Sherene Sires  In 6 months for physical .

## 2013-08-30 ENCOUNTER — Telehealth: Payer: Self-pay | Admitting: Internal Medicine

## 2013-08-30 NOTE — Telephone Encounter (Signed)
Pt's wife reports that pt has several skin tags that keep getting bigger - One under his arm and one in the inner thigh.  Instructed that Dr Sherene Sires does not do skin tag removal in office.  Wife will make pt an appt with the dermatologist that she sees.

## 2013-08-30 NOTE — Telephone Encounter (Signed)
Returning call.[name] [name] ° °

## 2013-08-30 NOTE — Telephone Encounter (Signed)
lmomtcb x1 for spouse 

## 2014-02-22 ENCOUNTER — Ambulatory Visit (INDEPENDENT_AMBULATORY_CARE_PROVIDER_SITE_OTHER)
Admission: RE | Admit: 2014-02-22 | Discharge: 2014-02-22 | Disposition: A | Discharge status: Home / Self Care | Payer: BC Managed Care – PPO | Source: Ambulatory Visit | Attending: Internal Medicine | Admitting: Internal Medicine

## 2014-02-22 ENCOUNTER — Ambulatory Visit (INDEPENDENT_AMBULATORY_CARE_PROVIDER_SITE_OTHER): Payer: BC Managed Care – PPO | Admitting: Internal Medicine

## 2014-02-22 ENCOUNTER — Encounter: Payer: Self-pay | Admitting: Internal Medicine

## 2014-02-22 ENCOUNTER — Other Ambulatory Visit (INDEPENDENT_AMBULATORY_CARE_PROVIDER_SITE_OTHER): Payer: BC Managed Care – PPO

## 2014-02-22 VITALS — BP 130/82 | HR 79 | Temp 98.2°F | Ht 71.0 in | Wt 275.6 lb

## 2014-02-22 DIAGNOSIS — Z87898 Personal history of other specified conditions: Secondary | ICD-10-CM

## 2014-02-22 DIAGNOSIS — E7800 Pure hypercholesterolemia, unspecified: Secondary | ICD-10-CM

## 2014-02-22 DIAGNOSIS — R5383 Other fatigue: Secondary | ICD-10-CM

## 2014-02-22 DIAGNOSIS — G4733 Obstructive sleep apnea (adult) (pediatric): Secondary | ICD-10-CM

## 2014-02-22 DIAGNOSIS — R5381 Other malaise: Secondary | ICD-10-CM | POA: Insufficient documentation

## 2014-02-22 DIAGNOSIS — E042 Nontoxic multinodular goiter: Secondary | ICD-10-CM

## 2014-02-22 DIAGNOSIS — F17200 Nicotine dependence, unspecified, uncomplicated: Secondary | ICD-10-CM

## 2014-02-22 LAB — LIPID PANEL
CHOL/HDL RATIO: 6
Cholesterol: 174 mg/dL (ref 0–200)
HDL: 27.8 mg/dL — AB (ref >39.00)
LDL Cholesterol: 118 mg/dL — ABNORMAL HIGH (ref 0–99)
Triglycerides: 142 mg/dL (ref 0.0–149.0)
VLDL: 28.4 mg/dL (ref 0.0–40.0)

## 2014-02-22 LAB — CBC WITH DIFFERENTIAL/PLATELET
Basophils Absolute: 0 10*3/uL (ref 0.0–0.1)
Basophils Relative: 0.3 % (ref 0.0–3.0)
EOS PCT: 2.2 % (ref 0.0–5.0)
Eosinophils Absolute: 0.2 10*3/uL (ref 0.0–0.7)
HEMATOCRIT: 42.7 % (ref 39.0–52.0)
Hemoglobin: 14.1 g/dL (ref 13.0–17.0)
LYMPHS ABS: 2 10*3/uL (ref 0.7–4.0)
Lymphocytes Relative: 18.2 % (ref 12.0–46.0)
MCHC: 33.1 g/dL (ref 30.0–36.0)
MCV: 83.7 fl (ref 78.0–100.0)
MONOS PCT: 6.7 % (ref 3.0–12.0)
Monocytes Absolute: 0.7 10*3/uL (ref 0.1–1.0)
NEUTROS PCT: 72.6 % (ref 43.0–77.0)
Neutro Abs: 8 10*3/uL — ABNORMAL HIGH (ref 1.4–7.7)
PLATELETS: 302 10*3/uL (ref 150.0–400.0)
RBC: 5.1 Mil/uL (ref 4.22–5.81)
RDW: 15.2 % — ABNORMAL HIGH (ref 11.5–14.6)
WBC: 11.1 10*3/uL — AB (ref 4.5–10.5)

## 2014-02-22 LAB — HEPATIC FUNCTION PANEL
ALBUMIN: 3.5 g/dL (ref 3.5–5.2)
ALK PHOS: 80 U/L (ref 39–117)
ALT: 37 U/L (ref 0–53)
AST: 23 U/L (ref 0–37)
Bilirubin, Direct: 0 mg/dL (ref 0.0–0.3)
Total Bilirubin: 0.6 mg/dL (ref 0.3–1.2)
Total Protein: 6.9 g/dL (ref 6.0–8.3)

## 2014-02-22 LAB — URINALYSIS
Bilirubin Urine: NEGATIVE
KETONES UR: NEGATIVE
Leukocytes, UA: NEGATIVE
Nitrite: NEGATIVE
PH: 6 (ref 5.0–8.0)
SPECIFIC GRAVITY, URINE: 1.025 (ref 1.000–1.030)
Total Protein, Urine: NEGATIVE
URINE GLUCOSE: NEGATIVE
Urobilinogen, UA: 0.2 (ref 0.0–1.0)

## 2014-02-22 LAB — BASIC METABOLIC PANEL
BUN: 12 mg/dL (ref 6–23)
CO2: 28 meq/L (ref 19–32)
Calcium: 9.4 mg/dL (ref 8.4–10.5)
Chloride: 103 mEq/L (ref 96–112)
Creatinine, Ser: 1 mg/dL (ref 0.4–1.5)
GFR: 103.65 mL/min (ref >60.00)
GLUCOSE: 91 mg/dL (ref 70–99)
POTASSIUM: 4.5 meq/L (ref 3.5–5.1)
SODIUM: 138 meq/L (ref 135–145)

## 2014-02-22 LAB — TSH: TSH: 1.55 u[IU]/mL (ref 0.35–5.50)

## 2014-02-22 LAB — PSA: PSA: 2.43 ng/mL (ref 0.10–4.00)

## 2014-02-22 MED ORDER — TRAZODONE HCL 50 MG PO TABS: 50.0000 mg | ORAL_TABLET | Freq: Every day | ORAL | Status: DC

## 2014-02-22 NOTE — Patient Instructions (Addendum)
trazadone 50mg  one half  and build to one at night  And if not better then we will refer you to your sleep doctor  Please remember to go to the lab and x-ray department downstairs for your tests - we will call you with the results when they are available.  Weight control is simply a matter of calorie balance which needs to be tilted in your favor by eating less and exercising more.  To get the most out of exercise, you need to be continuously aware that you are short of breath, but never out of breath, for 30 minutes daily. As you improve, it will actually be easier for you to do the same amount of exercise  in  30 minutes so always push to the level where you are short of breath.  If this does not result in gradual weight reduction then I strongly recommend you see a nutritionist with a food diary x 2 weeks so that we can work out a negative calorie balance which is universally effective in steady weight loss programs.  Think of your calorie balance like you do your bank account where in this case you want the balance to go down so you must take in less calories than you burn up.  It's just that simple:  Hard to do, but easy to understand.  Good luck!

## 2014-02-22 NOTE — Progress Notes (Signed)
Quick Note:  Spoke with pt and notified of results per Dr. Wert. Pt verbalized understanding and denied any questions.  ______ 

## 2014-02-22 NOTE — Progress Notes (Signed)
Subjective:    Patient ID: [name] [name], male    DOB: 09-Jan-1961   MRN: [id number]  Brief patient profile:  54 yobm with morbid obesity actively smoking with documented sleep apnea for which he has seen Dr. Halford Chessman with no improvement in his weight over the despite previous extensive counseling regarding calorie balance issues  History of Present Illness   April 25, 2009 ov states using cpap 15% and feels better rested on it but having trouble tolerating it. Denies excess daytime hypersomnolence. No sob or cough    06/06/2012 f/u ov/Wert still smoking  cc Pt c/o nasal and chest congestion x 6 days. He also c/o prod cough with minimal thick, clear sputum, no sob. No sinus pain, sorethroat. rec Prednisone 10 mg take  4 each am x 2 days,   2 each am x 2 days,  1 each am x2days and stop Doxycycline 100 mg twice daily Pepcid ac 20 mg (over the counter) take it after bfast and before bed until cough gone mucinex dm 2 every 12 hours as needed The key is to stop smoking completely before smoking completely stops you!  Late add:  Ordered thyroid u/s> bx > benign> referred to [facility] for goiter rx> canceled appt  09/06/2012 f/u ov/Wert cc new onset variable intensity daily pain shooting down L leg x sev weeks, some better with aleve, better if leaves wallet out of pock, rad hip knee and ankle. No pain with cough or weak, numbness in leg.  Also chronic fatigue s hypersomnolence, unable to lose wt. >>refer to ortho , endocrinology for goiter and dietician    02/22/2014 ov/Wert re: cpx / turned cpap in/ no [name] f/u Chief Complaint  Patient presents with  . Annual Exam    had a fall (Feb. 2015)-hurt left wrist &elbow-still tender),trouble sleeping-not using CPAP,not snoring,go to bed tired,wake up tired,wt. keeps going up,moody at times,no sob    Up to urinate 3 x at hs - more fatigued than drowsy.  No obvious day to day or daytime variabilty or assoc chronic cough or cp or chest tightness,  subjective wheeze overt sinus or hb symptoms. No unusual exp hx or h/o childhood pna/ asthma or knowledge of premature birth.  Sleeping ok without nocturnal  or early am exacerbation  of respiratory  c/o's or need for noct saba. Also denies any obvious fluctuation of symptoms with weather or environmental changes or other aggravating or alleviating factors except as outlined above   Current Medications, Allergies, Complete Past Medical History, Past Surgical History, Family History, and Social History were reviewed in [employer] record.  ROS  The following are not active complaints unless bolded sore throat, dysphagia, dental problems, itching, sneezing,  nasal congestion or excess/ purulent secretions, ear ache,   fever, chills, sweats, unintended wt loss, pleuritic or exertional cp, hemoptysis,  orthopnea pnd or leg swelling, presyncope, palpitations, heartburn, abdominal pain, anorexia, nausea, vomiting, diarrhea  or change in bowel or urinary habits, change in stools or urine, dysuria,hematuria,  rash, arthralgias, visual complaints, headache, numbness weakness or ataxia or problems with walking or coordination,  change in mood/affect or memory.             Past Medical History:  HYPERTENSION (ICD-V17.4)  Hx of EPIDIDYMITIS (ICD-604.90) > Left orchiectomy age 4  OBSTRUCTIVE SLEEP APNEA (ICD-327.23)...................................Marland KitchenSood     - Stopped   cpap 2012 on his own    - Sleep titration ordered 09/06/2012 due to excess fatigue MORBID OBESITY (  ICD-278.01)  - Target wt = 208 for BMI < 30  Hyperlipidemia male, h/o hbp and borderline dm, pos fm hx= target LDL < 130  HEALTH MAINTENANCE ....................................................................  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  wt 242 April 25, 2009 > 250 August 19, 2010 > 08/25/2011  253 > 06/06/2012  260 > 260 09/06/2012 >262 02/28/2013 > 02/23/2014  276  HEENT: nl dentition, turbinates, and oropharnx -external ear canals wax on L-mild  NECK : without JVD/Nodes/TM/ nl carotid upstrokes bilaterally - LUNGS: no acc muscle use, clear to A and P bilaterally without cough on insp or exp maneuvers  CV: RRR no s3 or murmur or increase in P2, no edema pedal pulses sym  ABD: soft and nontender with nl excursion in the supine position. No bruits or organomegaly, bowel sounds nl  MS: warm without deformities, calf tenderness, cyanosis or clubbing   SKIN: warm and dry without lesions  NEURO:no deficits noted  GU L testes surgically absent, no IH Rectal Mild bph/ smooth/ stool G neg      CXR  02/22/2014 :   There is rightward deviation of the thoracic trachea. Suspect a degree of thyroid enlargement. There is scarring in the left base. There is no edema or consolidation.    Recent Labs Lab 02/22/14 1010  NA 138  K 4.5  CL 103  CO2 28  BUN 12  CREATININE 1.0  GLUCOSE 91    Recent Labs Lab 02/22/14 1010  HGB 14.1  HCT 42.7  WBC 11.1*  PLT 302.0     Lab Results  Component Value Date   TSH 1.55 02/22/2014             Assessment & Plan:

## 2014-02-23 ENCOUNTER — Encounter: Payer: Self-pay | Admitting: Internal Medicine

## 2014-02-23 NOTE — Assessment & Plan Note (Signed)
No evidence of progression or significant tracheal compression  Lab Results  Component Value Date   TSH 1.55 02/22/2014

## 2014-02-23 NOTE — Assessment & Plan Note (Signed)
-   Stopped   cpap 2012 on his own    - Sleep titration ordered 09/06/2012 due to excess fatigue  Fatigued/ not sleepy > for now declines sleep re- eval > try trazadone 25 -50 mg at hs and avoid water p supper / sleep hygiene maximize and if not improving datime fatigue next step is re-consult Dr Halford Chessman

## 2014-02-23 NOTE — Assessment & Plan Note (Signed)
Lab Results  Component Value Date   PSA 2.43 02/22/2014   PSA 2.57 08/25/2011   PSA 2.27 08/19/2010     No evidence of ca or obst uropathy > no rx needed

## 2014-02-23 NOTE — Assessment & Plan Note (Signed)
-   Target wt < 208 to get BMI under 30    - Referred to nutrition 09/06/2012   Lab Results  Component Value Date   TSH 1.55 02/22/2014     Wt trending up > reviewed options

## 2014-02-23 NOTE — Assessment & Plan Note (Signed)
See OSA ?

## 2014-02-23 NOTE — Assessment & Plan Note (Signed)
-   Target LDL < 130    Lab Results  Component Value Date   CHOL 174 02/22/2014   HDL 27.80* 02/22/2014   LDLCALC 118* 02/22/2014   LDLDIRECT 139.5 08/19/2010   TRIG 142.0 02/22/2014   CHOLHDL 6 02/22/2014     Main rx is wt loss

## 2014-02-23 NOTE — Assessment & Plan Note (Signed)
I took an extended  opportunity with this patient to outline the consequences of continued cigarette use  in airway disorders based on all the data we have from the multiple national lung health studies (perfomed over decades at millions of dollars in cost)  indicating that smoking cessation, not choice of inhalers or physicians, is the most important aspect of care.    At this point not committed to quit

## 2014-04-03 ENCOUNTER — Telehealth: Payer: Self-pay | Admitting: Internal Medicine

## 2014-04-03 DIAGNOSIS — M79603 Pain in arm, unspecified: Secondary | ICD-10-CM

## 2014-04-03 DIAGNOSIS — M25529 Pain in unspecified elbow: Secondary | ICD-10-CM

## 2014-04-03 NOTE — Telephone Encounter (Signed)
Order has been placed. Pt's wife is aware. Nothing further is needed.

## 2014-04-03 NOTE — Telephone Encounter (Signed)
Spoke with pt's wife. Pt is having arm pain, he fell on the ice back in the winter and his arm is still bothering him. Would like to have a referral made to an ortho.  MW - please advise. Thanks.

## 2014-04-03 NOTE — Telephone Encounter (Signed)
Fine with me, whoever his insurance covers

## 2014-05-14 ENCOUNTER — Telehealth: Payer: Self-pay | Admitting: Internal Medicine

## 2014-05-14 NOTE — Telephone Encounter (Signed)
LMOM TCB x1 for pt's spouse

## 2014-05-14 NOTE — Telephone Encounter (Signed)
Pt's spouse returned call.  She reported that on 7.2.15 pt mentioned that he noticed a large sac of fluid on his right elbow.  Spouse reports that the sac is "flimsy" but denies any trauma to the area or insect bite.  Also denied any redness, pain, heat or drainage.  Pt unable to be seen until Friday 05/18/14 >> appt scheduled with MW for that date at 1145am.  Spouse is aware to watch for signs of infection (redness, pain, heat or drainage) and to call the office for sooner follow up if any of the symptoms occur.  Nothing further needed at this time; will sign off.

## 2014-05-18 ENCOUNTER — Ambulatory Visit (INDEPENDENT_AMBULATORY_CARE_PROVIDER_SITE_OTHER): Payer: BC Managed Care – PPO | Admitting: Internal Medicine

## 2014-05-18 ENCOUNTER — Encounter: Payer: Self-pay | Admitting: Internal Medicine

## 2014-05-18 VITALS — BP 112/70 | HR 87 | Temp 98.2°F | Ht 71.0 in | Wt 273.6 lb

## 2014-05-18 DIAGNOSIS — F17200 Nicotine dependence, unspecified, uncomplicated: Secondary | ICD-10-CM

## 2014-05-18 DIAGNOSIS — M7020 Olecranon bursitis, unspecified elbow: Secondary | ICD-10-CM

## 2014-05-18 DIAGNOSIS — M7021 Olecranon bursitis, right elbow: Secondary | ICD-10-CM | POA: Insufficient documentation

## 2014-05-18 MED ORDER — MELOXICAM 7.5 MG PO TABS: 7.5000 mg | ORAL_TABLET | Freq: Every day | ORAL | Status: DC

## 2014-05-18 NOTE — Progress Notes (Signed)
Subjective:    Patient ID: [name] [name], male    DOB: 1961-05-21   MRN: [id number]  Brief patient profile:  57 yobm with morbid obesity actively smoking with documented sleep apnea for which he has seen Dr. Halford [name] with no improvement in his weight over the despite previous extensive counseling regarding calorie balance issues  History of Present Illness   April 25, 2009 ov states using cpap 15% and feels better rested on it but having trouble tolerating it. Denies excess daytime hypersomnolence. No sob or cough    06/06/2012 f/u ov/[name] [name] still smoking  cc Pt c/o nasal and chest congestion x 6 days. He also c/o prod cough with minimal thick, clear sputum, no sob. No sinus pain, sorethroat. rec Prednisone 10 mg take  4 each am x 2 days,   2 each am x 2 days,  1 each am x2days and stop Doxycycline 100 mg twice daily Pepcid ac 20 mg (over the counter) take it after bfast and before bed until cough gone mucinex dm 2 every 12 hours as needed The key is to stop smoking completely before smoking completely stops you!  Late add:  Ordered thyroid u/s> bx > benign> referred to [facility] for goiter rx> canceled appt  09/06/2012 f/u ov/[name] [name] cc new onset variable intensity daily pain shooting down L leg x sev weeks, some better with aleve, better if leaves wallet out of pock, rad hip knee and ankle. No pain with cough or weak, numbness in leg.  Also chronic fatigue s hypersomnolence, unable to lose wt. >>refer to ortho , endocrinology for goiter and dietician    02/22/2014 ov/[name] [name] re: cpx / turned cpap in/ no [name] [name] f/u Chief Complaint  Patient presents with  . Annual Exam    had a fall (Feb. 2015)-hurt left wrist &elbow-still tender),trouble sleeping-not using CPAP,not snoring,go to bed tired,wake up tired,wt. keeps going up,moody at times,no sob    Up to urinate 3 x at hs - more fatigued than drowsy. rec trazadone  50 mg at hs  05/18/2014 acute ov/[name] [name] re: new R Elbow swelling  Chief Complaint   Patient presents with  . Acute Visit    Pt states noticed some swelling on his left elbow x 1 wk ago- not painful. He also c/o increased arthritic pain in hips and knees.   no recent trauma, took a few advil and no better hips or knees. Previously ? resp to mobic, saw [name] [name] then   No obvious day to day or daytime variabilty or assoc chronic cough or cp or chest tightness, subjective wheeze overt sinus or hb symptoms. No unusual exp hx or h/o childhood pna/ asthma or knowledge of premature birth.  Sleeping ok without nocturnal  or early am exacerbation  of respiratory  c/o's or need for noct saba. Also denies any obvious fluctuation of symptoms with weather or environmental changes or other aggravating or alleviating factors except as outlined above   Current Medications, Allergies, Complete Past Medical History, Past Surgical History, Family History, and Social History were reviewed in [employer] record.  ROS  The following are not active complaints unless bolded sore throat, dysphagia, dental problems, itching, sneezing,  nasal congestion or excess/ purulent secretions, ear ache,   fever, chills, sweats, unintended wt loss, pleuritic or exertional cp, hemoptysis,  orthopnea pnd or leg swelling, presyncope, palpitations, heartburn, abdominal pain, anorexia, nausea, vomiting, diarrhea  or change in bowel or urinary habits, change in stools or urine, dysuria,hematuria,  rash, arthralgias,  visual complaints, headache, numbness weakness or ataxia or problems with walking or coordination,  change in mood/affect or memory.             Past Medical History:  HYPERTENSION (ICD-V17.4)  Hx of EPIDIDYMITIS (ICD-604.90) > Left orchiectomy age 70  OBSTRUCTIVE SLEEP APNEA (ICD-327.23)...................................Marland KitchenSood     - Stopped   cpap 2012 on his own    - Sleep titration ordered 09/06/2012 due to excess fatigue MORBID OBESITY (ICD-278.01)  - Target wt = 208 for BMI <  30  Hyperlipidemia male, h/o hbp and borderline dm, pos fm hx= target LDL < 130  HEALTH MAINTENANCE ....................................................................  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 No bruits or organomegaly, bowel sounds nl  MS: warm without  , calf tenderness, cyanosis or clubbing   Moderate sts the size of a small bean bag R olecranon s erythema, calor or joint restriction SKIN: warm and dry without lesions  NEURO:no deficits noted        CXR  02/22/2014 :   There is rightward deviation of the thoracic trachea. Suspect a degree of thyroid enlargement. There is scarring in the left base. There is no edema or consolidation.    Recent Labs Lab 02/22/14 1010  NA 138  K 4.5  CL 103  CO2 28  BUN 12  CREATININE 1.0  GLUCOSE 91    Recent Labs Lab 02/22/14 1010  HGB 14.1  HCT 42.7  WBC 11.1*  PLT 302.0     Lab Results  Component Value Date   TSH 1.55 02/22/2014             Assessment & Plan:

## 2014-05-18 NOTE — Patient Instructions (Signed)
mobic (meloxicam) 7.5 mg twice daily with meals as needed   Please see patient coordinator before you leave today  to schedule ortho referred back to Dr [name] ortho for olecranon bursitis   The key is to stop smoking completely before smoking completely stops you!   Keep appt for yearly physical

## 2014-05-19 NOTE — Assessment & Plan Note (Signed)
Referred back to ortho 05/18/2014  Dr [name]  Try mobic 7.5 mg bid prn in meantime

## 2014-05-19 NOTE — Assessment & Plan Note (Signed)
I took an extended  opportunity with this patient to outline the consequences of continued cigarette use  in airway disorders based on all the data we have from the multiple national lung health studies (perfomed over decades at millions of dollars in cost)  indicating that smoking cessation, not choice of inhalers or physicians, is the most important aspect of care.   

## 2014-10-17 ENCOUNTER — Encounter (HOSPITAL_COMMUNITY): Payer: Self-pay | Admitting: Emergency Medicine

## 2014-10-17 ENCOUNTER — Emergency Department (HOSPITAL_COMMUNITY)
Admission: EM | Admit: 2014-10-17 | Discharge: 2014-10-17 | Disposition: A | Discharge status: Home / Self Care | Payer: BC Managed Care – PPO | Source: Home / Self Care | Attending: Family Medicine | Admitting: Family Medicine

## 2014-10-17 ENCOUNTER — Ambulatory Visit (HOSPITAL_COMMUNITY): Payer: BC Managed Care – PPO | Attending: Family Medicine

## 2014-10-17 DIAGNOSIS — R05 Cough: Secondary | ICD-10-CM | POA: Diagnosis present

## 2014-10-17 DIAGNOSIS — R509 Fever, unspecified: Secondary | ICD-10-CM | POA: Diagnosis not present

## 2014-10-17 DIAGNOSIS — J398 Other specified diseases of upper respiratory tract: Secondary | ICD-10-CM | POA: Diagnosis not present

## 2014-10-17 DIAGNOSIS — J40 Bronchitis, not specified as acute or chronic: Secondary | ICD-10-CM

## 2014-10-17 MED ORDER — PREDNISONE 50 MG PO TABS: 50.0000 mg | ORAL_TABLET | Freq: Every day | ORAL | Status: DC

## 2014-10-17 MED ORDER — ALBUTEROL SULFATE HFA 108 (90 BASE) MCG/ACT IN AERS: 2.0000 | INHALATION_SPRAY | Freq: Four times a day (QID) | RESPIRATORY_TRACT | Status: DC | PRN

## 2014-10-17 MED ORDER — IPRATROPIUM-ALBUTEROL 0.5-2.5 (3) MG/3ML IN SOLN
RESPIRATORY_TRACT | Status: AC
  Filled 2014-10-17: qty 3

## 2014-10-17 MED ORDER — IPRATROPIUM-ALBUTEROL 0.5-2.5 (3) MG/3ML IN SOLN
3.0000 mL | Freq: Once | RESPIRATORY_TRACT | Status: AC
  Administered 2014-10-17: 3 mL via RESPIRATORY_TRACT

## 2014-10-17 MED ORDER — GUAIFENESIN-CODEINE 100-10 MG/5ML PO SOLN: 5.0000 mL | Freq: Every evening | ORAL | Status: DC | PRN

## 2014-10-17 MED ORDER — AZITHROMYCIN 250 MG PO TABS: 250.0000 mg | ORAL_TABLET | Freq: Every day | ORAL | Status: DC

## 2014-10-17 NOTE — Discharge Instructions (Signed)
Thank you for coming in today. Prednisone and azithromycin daily. Use codeine containing cough medication as needed for cough. Do not drive after taking this medicine Use albuterol as needed for wheezing or shortness of breath.  Acute Bronchitis Bronchitis is inflammation of the airways that extend from the windpipe into the lungs (bronchi). The inflammation often causes mucus to develop. This leads to a cough, which is the most common symptom of bronchitis.  In acute bronchitis, the condition usually develops suddenly and goes away over time, usually in a couple weeks. Smoking, allergies, and asthma can make bronchitis worse. Repeated episodes of bronchitis may cause further lung problems.  CAUSES Acute bronchitis is most often caused by the same virus that causes a cold. The virus can spread from person to person (contagious) through coughing, sneezing, and touching contaminated objects. SIGNS AND SYMPTOMS   Cough.   Fever.   Coughing up mucus.   Body aches.   Chest congestion.   Chills.   Shortness of breath.   Sore throat.  DIAGNOSIS  Acute bronchitis is usually diagnosed through a physical exam. Your health care provider will also ask you questions about your medical history. Tests, such as chest X-rays, are sometimes done to rule out other conditions.  TREATMENT  Acute bronchitis usually goes away in a couple weeks. Oftentimes, no medical treatment is necessary. Medicines are sometimes given for relief of fever or cough. Antibiotic medicines are usually not needed but may be prescribed in certain situations. In some cases, an inhaler may be recommended to help reduce shortness of breath and control the cough. A cool mist vaporizer may also be used to help thin bronchial secretions and make it easier to clear the chest.  HOME CARE INSTRUCTIONS  Get plenty of rest.   Drink enough fluids to keep your urine clear or pale yellow (unless you have a medical condition that  requires fluid restriction). Increasing fluids may help thin your respiratory secretions (sputum) and reduce chest congestion, and it will prevent dehydration.   Take medicines only as directed by your health care provider.  If you were prescribed an antibiotic medicine, finish it all even if you start to feel better.  Avoid smoking and secondhand smoke. Exposure to cigarette smoke or irritating chemicals will make bronchitis worse. If you are a smoker, consider using nicotine gum or skin patches to help control withdrawal symptoms. Quitting smoking will help your lungs heal faster.   Reduce the chances of another bout of acute bronchitis by washing your hands frequently, avoiding people with cold symptoms, and trying not to touch your hands to your mouth, nose, or eyes.   Keep all follow-up visits as directed by your health care provider.  SEEK MEDICAL CARE IF: Your symptoms do not improve after 1 week of treatment.  SEEK IMMEDIATE MEDICAL CARE IF:  You develop an increased fever or chills.   You have chest pain.   You have severe shortness of breath.  You have bloody sputum.   You develop dehydration.  You faint or repeatedly feel like you are going to pass out.  You develop repeated vomiting.  You develop a severe headache. MAKE SURE YOU:   Understand these instructions.  Will watch your condition.  Will get help right away if you are not doing well or get worse. Document Released: 12/03/2004 Document Revised: 03/12/2014 Document Reviewed: 04/18/2013 [facility] Patient Information 2015 [place], [place]. This information is not intended to replace advice given to you by your health care provider.  Make sure you discuss any questions you have with your health care provider. ° °

## 2014-10-17 NOTE — ED Provider Notes (Signed)
[name] [name] is a 53 y.o. male who presents to Urgent Care today for cough congestion as body aches and shortness of breath. Symptoms present for a day. No vomiting diarrhea or chest pain. No medications tried yet. Patient worsened today. He feels well otherwise.   Past Medical History  Diagnosis Date  . Epididymitis   . OSA (obstructive sleep apnea)   . Morbid obesity   . Hyperlipidemia   . Arthritis    Past Surgical History  Procedure Laterality Date  . Testicle removal  age 69   History  Substance Use Topics  . Smoking status: Current Every Day Smoker -- 0.50 packs/day for 25 years    Types: Cigarettes  . Smokeless tobacco: Never Used  . Alcohol Use: 1.5 - 2.0 oz/week    3-4 Not specified per week   ROS as above Medications: No current facility-administered medications for this encounter.   Current Outpatient Prescriptions  Medication Sig Dispense Refill  . aspirin 81 MG tablet Take 81 mg by mouth. 1 to 2 times per wk    . albuterol (PROVENTIL HFA;VENTOLIN HFA) 108 (90 BASE) MCG/ACT inhaler Inhale 2 puffs into the lungs every 6 (six) hours as needed for wheezing or shortness of breath. 1 Inhaler 2  . azithromycin (ZITHROMAX) 250 MG tablet Take 1 tablet (250 mg total) by mouth daily. Take first 2 tablets together, then 1 every day until finished. 6 tablet 0  . guaiFENesin-codeine 100-10 MG/5ML syrup Take 5 mLs by mouth at bedtime as needed for cough. 120 mL 0  . ibuprofen (ADVIL,MOTRIN) 200 MG tablet Take 200 mg by mouth every 6 (six) hours as needed.    . meloxicam (MOBIC) 7.5 MG tablet Take 1 tablet (7.5 mg total) by mouth daily. 30 tablet 5  . predniSONE (DELTASONE) 50 MG tablet Take 1 tablet (50 mg total) by mouth daily. 5 tablet 0  . traZODone (DESYREL) 50 MG tablet Take 50 mg by mouth at bedtime as needed.     No Known Allergies   Exam:  BP 132/92 mmHg  Pulse 96  Temp(Src) 99.9 F (37.7 C) (Oral)  Resp 24  SpO2 93% Gen: Well NAD HEENT: EOMI,  MMM posterior  pharynx cobblestoning. Normal tympanic membranes bilaterally. Lungs: Normal work of breathing. Coarse breath sounds throughout. Heart: RRR no MRG Abd: NABS, Soft. Nondistended, Nontender Exts: Brisk capillary refill, warm and well perfused.   Patient was given a 2.5/0.5mg  duoneb breathing treatment which did not help much.    No results found for this or any previous visit (from the past 24 hour(s)). Dg Chest 2 View  10/17/2014   CLINICAL DATA:  Productive cough for 1 day. Fever. Initial encounter.  EXAM: CHEST  2 VIEW  COMPARISON:  Radiographs 02/22/2014 and 09/06/2012. Thyroid ultrasound 06/13/2012.  FINDINGS: The heart size and mediastinal contours are stable with stable rightward deviation of the trachea related to enlargement of the left thyroid lobe. Chronic scarring in both lung bases appears stable. There is no superimposed airspace disease, edema or significant pleural effusion. The bones appear unchanged.  IMPRESSION: Stable chest with chronic tracheal deviation. No evidence of pneumonia.   Electronically Signed   By: Camie Patience M.D.   On: 10/17/2014 19:40    Assessment and Plan: 53 y.o. male with Bronchitis.  Treatment with prednisone, azithromycin, albuterol and codeine cough medicine.   Discussed warning signs or symptoms. Please see discharge instructions. Patient expresses understanding.     Gregor Hams, MD 10/17/14 2006

## 2014-10-17 NOTE — ED Notes (Signed)
53 year old male  Became ill about 4 pm yesterday fever, body aches, cough, congestion.  Has felt real bad today.

## 2015-08-15 ENCOUNTER — Encounter: Payer: Self-pay | Admitting: Adult Health

## 2015-08-15 ENCOUNTER — Ambulatory Visit (INDEPENDENT_AMBULATORY_CARE_PROVIDER_SITE_OTHER): Payer: BC Managed Care – PPO | Admitting: Adult Health

## 2015-08-15 VITALS — BP 120/72 | HR 78 | Temp 98.5°F | Ht 71.0 in | Wt 280.0 lb

## 2015-08-15 DIAGNOSIS — H00013 Hordeolum externum right eye, unspecified eyelid: Secondary | ICD-10-CM | POA: Diagnosis not present

## 2015-08-15 DIAGNOSIS — H00019 Hordeolum externum unspecified eye, unspecified eyelid: Secondary | ICD-10-CM | POA: Insufficient documentation

## 2015-08-15 MED ORDER — ERYTHROMYCIN 5 MG/GM OP OINT: 1.0000 "application " | TOPICAL_OINTMENT | Freq: Four times a day (QID) | OPHTHALMIC | Status: AC

## 2015-08-15 NOTE — Assessment & Plan Note (Signed)
Right upper lid stye   Plan   Warm compresses to eye As needed   Erythromycin eye ointment to affected eye Four times a day  For 5 days .  If not improving will need to see eye doctor  Follow up Dr. Melvyn Novas  In 6 weeks for physical Please contact office for sooner follow up if symptoms do not improve or worsen or seek emergency care

## 2015-08-15 NOTE — Progress Notes (Signed)
Chart and office note reviewed in detail  > agree with a/p as outlined    

## 2015-08-15 NOTE — Patient Instructions (Addendum)
Warm compresses to eye As needed   Erythromycin eye ointment to affected eye Four times a day  For 5 days .  If not improving will need to see eye doctor  Follow up Dr. Melvyn Novas  In 6 weeks for physical Please contact office for sooner follow up if symptoms do not improve or worsen or seek emergency care

## 2015-08-15 NOTE — Progress Notes (Signed)
Subjective:    Patient ID: [name] [name], male    DOB: Dec 04, 1960   MRN: [id number]  Brief patient profile:  36 yobm with morbid obesity actively smoking with documented sleep apnea for which he has seen Dr. Halford Chessman with no improvement in his weight over the despite previous extensive counseling regarding calorie balance issues  History of Present Illness   April 25, 2009 ov states using cpap 15% and feels better rested on it but having trouble tolerating it. Denies excess daytime hypersomnolence. No sob or cough    06/06/2012 f/u ov/Wert still smoking  cc Pt c/o nasal and chest congestion x 6 days. He also c/o prod cough with minimal thick, clear sputum, no sob. No sinus pain, sorethroat. rec Prednisone 10 mg take  4 each am x 2 days,   2 each am x 2 days,  1 each am x2days and stop Doxycycline 100 mg twice daily Pepcid ac 20 mg (over the counter) take it after bfast and before bed until cough gone mucinex dm 2 every 12 hours as needed The key is to stop smoking completely before smoking completely stops you!  Late add:  Ordered thyroid u/s> bx > benign> referred to [facility] for goiter rx> canceled appt  09/06/2012 f/u ov/Wert cc new onset variable intensity daily pain shooting down L leg x sev weeks, some better with aleve, better if leaves wallet out of pock, rad hip knee and ankle. No pain with cough or weak, numbness in leg.  Also chronic fatigue s hypersomnolence, unable to lose wt. >>refer to ortho , endocrinology for goiter and dietician    02/22/2014 ov/Wert re: cpx / turned cpap in/ no [name] f/u Chief Complaint  Patient presents with  . Annual Exam    had a fall (Feb. 2015)-hurt left wrist &elbow-still tender),trouble sleeping-not using CPAP,not snoring,go to bed tired,wake up tired,wt. keeps going up,moody at times,no sob    Up to urinate 3 x at hs - more fatigued than drowsy. rec trazadone  50 mg at hs  05/18/2014 acute ov/Wert re: new R Elbow swelling  Chief Complaint   Patient presents with  . Acute Visit    Pt states noticed some swelling on his left elbow x 1 wk ago- not painful. He also c/o increased arthritic pain in hips and knees.   no recent trauma, took a few advil and no better hips or knees. Previously ? resp to mobic, saw Dean then  08/15/2015 Acute OV  Presents for an acute office visit  Complains of right eye being red and swollen x 1 day . Pt states he is using drops., otc tears.  He says he has had a stye before just like this No drainage or discharge. On rash or severe pain.  No injury. No visual changes.        Current Medications, Allergies, Complete Past Medical History, Past Surgical History, Family History, and Social History were reviewed in [employer] record.  ROS  The following are not active complaints unless bolded sore throat, dysphagia, dental problems, itching, sneezing,  nasal congestion or excess/ purulent secretions, ear ache,   fever, chills, sweats, unintended wt loss, pleuritic or exertional cp, hemoptysis,  orthopnea pnd or leg swelling, presyncope, palpitations, heartburn, abdominal pain, anorexia, nausea, vomiting, diarrhea  or change in bowel or urinary habits, change in stools or urine, dysuria,hematuria,  rash,  visual complaints, headache, numbness weakness or ataxia or problems with walking or coordination,  change in mood/affect or  memory.             Past Medical History:  HYPERTENSION (ICD-V17.4)  Hx of EPIDIDYMITIS (ICD-604.90) > Left orchiectomy age 9  OBSTRUCTIVE SLEEP APNEA (ICD-327.23)...................................Marland KitchenSood     - Stopped   cpap 2012 on his own    - Sleep titration ordered 09/06/2012 due to excess fatigue MORBID OBESITY (ICD-278.01)  - Target wt = 208 for BMI < 30  Hyperlipidemia male, h/o hbp and borderline dm, pos fm hx= target LDL < 130  HEALTH MAINTENANCE ....................................................................  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           Objective:   Physical Exam    mb pleasant obese bm nad   wt 242 April 25, 2009 > 250 August 19, 2010 > 08/25/2011  253 > 06/06/2012  260 > 260 09/06/2012 >262 02/28/2013 > 02/23/2014  276 > 05/19/2014  273>>  HEENT: nl dentition, turbinates, and oropharnx  Along upper right eyelid with induration /tendenes noted c/w stye , conjuctiva noninjected  NECK : without JVD/Nodes/TM/ nl carotid upstrokes bilaterally - LUNGS: no acc muscle use, clear to A and P bilaterally without cough on insp or exp maneuvers  CV: RRR no s3 or murmur or increase in P2, no edema pedal pulses sym  ABD: soft and nontender with nl excursion in the supine position. No bruits or organomegaly, bowel sounds nl  MS: warm without  , calf tenderness, cyanosis or clubbing     SKIN: warm and dry without lesions  NEURO:no deficits noted        CXR  02/22/2014 :   There is rightward deviation of the thoracic trachea. Suspect a degree of thyroid enlargement. There is scarring in the left base. There is no edema or consolidation.    Recent Labs Lab 02/22/14 1010  NA 138  K 4.5  CL 103  CO2 28  BUN 12  CREATININE 1.0  GLUCOSE 91    Recent Labs Lab 02/22/14 1010  HGB 14.1  HCT 42.7  WBC 11.1*  PLT 302.0     Lab Results  Component Value Date   TSH 1.55 02/22/2014             Assessment & Plan:

## 2015-09-13 ENCOUNTER — Telehealth: Payer: Self-pay | Admitting: Internal Medicine

## 2015-09-13 NOTE — Telephone Encounter (Signed)
Spoke with pt's wife, explained that we would be happy to see [facility] at [facility], but we would need to set him up with one of our primary care providers as a new pt.   She declined.  Pt is out of town and that is why she wanted him seen on 09/14/15.  Referred to [facility] for walk in weekend appointment availablity times.  She will call us if she would like to establish primary care in the future.

## 2015-09-14 ENCOUNTER — Ambulatory Visit (INDEPENDENT_AMBULATORY_CARE_PROVIDER_SITE_OTHER): Payer: BC Managed Care – PPO | Admitting: Family Medicine

## 2015-09-14 ENCOUNTER — Ambulatory Visit: Payer: BC Managed Care – PPO | Admitting: Family Medicine

## 2015-09-14 VITALS — BP 122/60 | HR 87 | Temp 98.1°F | Resp 17 | Ht 69.75 in | Wt 275.0 lb

## 2015-09-14 DIAGNOSIS — J209 Acute bronchitis, unspecified: Secondary | ICD-10-CM

## 2015-09-14 MED ORDER — AZITHROMYCIN 250 MG PO TABS: ORAL_TABLET | ORAL | Status: DC

## 2015-09-14 MED ORDER — HYDROCODONE-HOMATROPINE 5-1.5 MG/5ML PO SYRP: 5.0000 mL | ORAL_SOLUTION | Freq: Three times a day (TID) | ORAL | Status: DC | PRN

## 2015-09-14 MED ORDER — ALBUTEROL SULFATE HFA 108 (90 BASE) MCG/ACT IN AERS: 2.0000 | INHALATION_SPRAY | RESPIRATORY_TRACT | Status: DC | PRN

## 2015-09-14 NOTE — Patient Instructions (Signed)

## 2015-09-14 NOTE — Progress Notes (Signed)
This chart was scribed for Robyn Haber, MD by Moises Blood, medical scribe at [facility].The patient was seen in exam room 11 and the patient's care was started at 3:35 PM.  Patient ID: [name] [name] MRN: [id number], DOB: 06-25-1961, 54 y.o. Date of Encounter: 09/14/2015  Primary Physician: Christinia Gully, MD  Chief Complaint:  Chief Complaint  Patient presents with   URI    C/O sore throat, headache, cough, & congestion x 10 days-tried Mucinex & Nyquil with no relief    HPI:  [name] [name] is a 54 y.o. male who presents to [facility] complaining of URI symptoms with fatigue for 10 days now. He's been coughing (dry and hacking) a lot, especially at night. He has to drink water and then will produce plenty of phlegm. He's tried vicks cough, mucinex, and nyquil but without relief. He denies taking desyrel for sleep. He also takes baby aspirin 3-4 times a week. He was also using eye drops for his eye stye a week ago.   He was on cpap machine for a few years.   He is a current smoker.  He works as a Chief Strategy Officer. He came in with his wife today.   Past Medical History  Diagnosis Date   Epididymitis    OSA (obstructive sleep apnea)    Morbid obesity ([place])    Hyperlipidemia    Arthritis      Home Meds: Prior to Admission medications   Medication Sig Start Date End Date Taking? Authorizing Provider  aspirin 81 MG tablet Take 81 mg by mouth. 1 to 2 times per wk   Yes Historical Provider, MD  guaiFENesin (MUCINEX) 600 MG 12 hr tablet Take by mouth 2 (two) times daily.   Yes Historical Provider, MD  ibuprofen (ADVIL,MOTRIN) 200 MG tablet Take 200 mg by mouth every 6 (six) hours as needed.   Yes Historical Provider, MD  albuterol (PROVENTIL HFA;VENTOLIN HFA) 108 (90 BASE) MCG/ACT inhaler Inhale 2 puffs into the lungs every 6 (six) hours as needed for wheezing or shortness of breath. Patient not taking: Reported on 09/14/2015 10/17/14   Gregor Hams, MD  guaiFENesin-codeine 100-10 MG/5ML syrup Take 5 mLs by mouth at bedtime as needed for cough. Patient not taking: Reported on 08/15/2015 10/17/14   Gregor Hams, MD  meloxicam (MOBIC) 7.5 MG tablet Take 1 tablet (7.5 mg total) by mouth daily. Patient not taking: Reported on 08/15/2015 05/18/14   Tanda Rockers, MD  NON FORMULARY Eyes drops use as directed on bottle.    Historical Provider, MD  predniSONE (DELTASONE) 50 MG tablet Take 1 tablet (50 mg total) by mouth daily. Patient not taking: Reported on 08/15/2015 10/17/14   Gregor Hams, MD  traZODone (DESYREL) 50 MG tablet Take 50 mg by mouth at bedtime as needed. 02/22/14   Tanda Rockers, MD    Allergies: No Known Allergies  Social History   Social History   Marital Status: Married    Spouse Name: N/A   Number of Children: N/A   Years of Education: N/A   Occupational History   Retired    Social History Main Topics   Smoking status: Current Every Day Smoker -- 0.50 packs/day for 25 years    Types: Cigarettes   Smokeless tobacco: Never Used   Alcohol Use: 1.5 - 2.0 oz/week    3-4 Standard drinks or equivalent per week   Drug Use: No   Sexual Activity: Not  on file   Other Topics Concern   Not on file   Social History Narrative     Review of Systems: Constitutional: negative for fever, chills, night sweats, weight changes; positive for fatigue  HEENT: negative for vision changes, hearing loss, epistaxis, or sinus pressure; positive for sore throat, congestion, voice change Cardiovascular: negative for chest pain or palpitations Respiratory: negative for hemoptysis, wheezing, shortness of breath; positive for sore throat, cough Abdominal: negative for abdominal pain, nausea, vomiting, diarrhea, or constipation Dermatological: negative for rash Neurologic: negative for dizziness, or syncope; positive for headache All other systems reviewed and are otherwise negative with the exception to those above and in  the HPI.  Physical Exam: Blood pressure 122/60, pulse 87, temperature 98.1 F (36.7 C), temperature source Oral, resp. rate 17, height 5' 9.75" (1.772 m), weight 275 lb (124.739 kg), SpO2 95 %., Body mass index is 39.73 kg/(m^2). General: Well developed, well nourished, in no acute distress. Head: Normocephalic, atraumatic, eyes without discharge, sclera non-icteric, nares are without discharge. Bilateral auditory canals clear, TM's are without perforation, pearly grey and translucent with reflective cone of light bilaterally. Oral cavity moist, posterior pharynx without exudate, erythema, peritonsillar abscess, or post nasal drip.  Neck: Supple. No thyromegaly. Full ROM. No lymphadenopathy. Lungs: Clear bilaterally to auscultation without rales, or rhonchi. Bilaterally wheezes inhalatory and expiratory  Heart: RRR with S1 S2. No murmurs, rubs, or gallops appreciated. Abdomen: Soft, non-tender, non-distended with normoactive bowel sounds. No hepatomegaly. No rebound/guarding. No obvious abdominal masses. Msk:  Strength and tone normal for age. Extremities/Skin: Warm and dry. No clubbing or cyanosis. No edema. No rashes or suspicious lesions. Neuro: Alert and oriented X 3. Moves all extremities spontaneously. Gait is normal. CNII-XII grossly in tact. Psych:  Responds to questions appropriately with a normal affect.    ASSESSMENT AND PLAN:  54 y.o. year old male with acute bronchitis of 2 week duration This chart was scribed in my presence and reviewed by me personally.    ICD-9-CM ICD-10-CM   1. Acute bronchitis, unspecified organism 466.0 J20.9 HYDROcodone-homatropine (HYCODAN) 5-1.5 MG/5ML syrup     albuterol (PROVENTIL HFA;VENTOLIN HFA) 108 (90 BASE) MCG/ACT inhaler     azithromycin (ZITHROMAX) 250 MG tablet     Signed, Robyn Haber, MD    By signing my name below, I, Moises Blood, attest that this documentation has been prepared under the direction and in the presence of Robyn Haber, MD. Electronically Signed: Moises Blood, Scribe. 09/14/2015 , 3:35 PM .  Signed, Robyn Haber, MD 09/14/2015 3:35 PM

## 2015-09-27 ENCOUNTER — Ambulatory Visit: Payer: BC Managed Care – PPO | Admitting: Internal Medicine

## 2015-10-25 ENCOUNTER — Ambulatory Visit: Payer: BC Managed Care – PPO | Admitting: Internal Medicine

## 2016-01-14 ENCOUNTER — Encounter: Payer: Self-pay | Admitting: Internal Medicine

## 2016-09-02 ENCOUNTER — Ambulatory Visit (INDEPENDENT_AMBULATORY_CARE_PROVIDER_SITE_OTHER)
Admission: RE | Admit: 2016-09-02 | Discharge: 2016-09-02 | Disposition: A | Discharge status: Home / Self Care | Payer: BC Managed Care – PPO | Source: Ambulatory Visit | Attending: Internal Medicine | Admitting: Internal Medicine

## 2016-09-02 ENCOUNTER — Encounter: Payer: Self-pay | Admitting: Internal Medicine

## 2016-09-02 ENCOUNTER — Other Ambulatory Visit (INDEPENDENT_AMBULATORY_CARE_PROVIDER_SITE_OTHER): Payer: BC Managed Care – PPO

## 2016-09-02 ENCOUNTER — Ambulatory Visit (INDEPENDENT_AMBULATORY_CARE_PROVIDER_SITE_OTHER): Payer: BC Managed Care – PPO | Admitting: Internal Medicine

## 2016-09-02 VITALS — BP 114/80 | HR 78 | Ht 71.0 in | Wt 281.4 lb

## 2016-09-02 DIAGNOSIS — Z0000 Encounter for general adult medical examination without abnormal findings: Secondary | ICD-10-CM | POA: Diagnosis not present

## 2016-09-02 DIAGNOSIS — E7800 Pure hypercholesterolemia, unspecified: Secondary | ICD-10-CM | POA: Diagnosis not present

## 2016-09-02 DIAGNOSIS — M7021 Olecranon bursitis, right elbow: Secondary | ICD-10-CM | POA: Diagnosis not present

## 2016-09-02 DIAGNOSIS — F17210 Nicotine dependence, cigarettes, uncomplicated: Secondary | ICD-10-CM

## 2016-09-02 DIAGNOSIS — L918 Other hypertrophic disorders of the skin: Secondary | ICD-10-CM | POA: Diagnosis not present

## 2016-09-02 DIAGNOSIS — R7989 Other specified abnormal findings of blood chemistry: Secondary | ICD-10-CM | POA: Diagnosis not present

## 2016-09-02 LAB — LIPID PANEL
Cholesterol: 185 mg/dL (ref 0–200)
HDL: 28.3 mg/dL — AB (ref >39.00)
NONHDL: 156.35
TRIGLYCERIDES: 204 mg/dL — AB (ref 0.0–149.0)
Total CHOL/HDL Ratio: 7
VLDL: 40.8 mg/dL — ABNORMAL HIGH (ref 0.0–40.0)

## 2016-09-02 LAB — URINALYSIS
BILIRUBIN URINE: NEGATIVE
Ketones, ur: NEGATIVE
LEUKOCYTES UA: NEGATIVE
NITRITE: NEGATIVE
PH: 6.5 (ref 5.0–8.0)
Specific Gravity, Urine: 1.015 (ref 1.000–1.030)
Total Protein, Urine: NEGATIVE
Urine Glucose: NEGATIVE
Urobilinogen, UA: 0.2 (ref 0.0–1.0)

## 2016-09-02 LAB — CBC WITH DIFFERENTIAL/PLATELET
BASOS ABS: 0 10*3/uL (ref 0.0–0.1)
Basophils Relative: 0.3 % (ref 0.0–3.0)
EOS PCT: 2 % (ref 0.0–5.0)
Eosinophils Absolute: 0.3 10*3/uL (ref 0.0–0.7)
HCT: 45.8 % (ref 39.0–52.0)
Hemoglobin: 15.3 g/dL (ref 13.0–17.0)
LYMPHS ABS: 2.3 10*3/uL (ref 0.7–4.0)
Lymphocytes Relative: 17.3 % (ref 12.0–46.0)
MCHC: 33.4 g/dL (ref 30.0–36.0)
MCV: 82.7 fl (ref 78.0–100.0)
MONO ABS: 0.8 10*3/uL (ref 0.1–1.0)
Monocytes Relative: 5.9 % (ref 3.0–12.0)
NEUTROS PCT: 74.5 % (ref 43.0–77.0)
Neutro Abs: 10 10*3/uL — ABNORMAL HIGH (ref 1.4–7.7)
Platelets: 340 10*3/uL (ref 150.0–400.0)
RBC: 5.54 Mil/uL (ref 4.22–5.81)
RDW: 14.8 % (ref 11.5–15.5)
WBC: 13.4 10*3/uL — ABNORMAL HIGH (ref 4.0–10.5)

## 2016-09-02 LAB — HEPATIC FUNCTION PANEL
ALT: 25 U/L (ref 0–53)
AST: 16 U/L (ref 0–37)
Albumin: 4.3 g/dL (ref 3.5–5.2)
Alkaline Phosphatase: 89 U/L (ref 39–117)
BILIRUBIN TOTAL: 0.4 mg/dL (ref 0.2–1.2)
Bilirubin, Direct: 0.1 mg/dL (ref 0.0–0.3)
Total Protein: 7.5 g/dL (ref 6.0–8.3)

## 2016-09-02 LAB — LDL CHOLESTEROL, DIRECT: Direct LDL: 136 mg/dL

## 2016-09-02 LAB — BASIC METABOLIC PANEL
BUN: 13 mg/dL (ref 6–23)
CALCIUM: 10.1 mg/dL (ref 8.4–10.5)
CO2: 30 mEq/L (ref 19–32)
CREATININE: 0.98 mg/dL (ref 0.40–1.50)
Chloride: 102 mEq/L (ref 96–112)
GFR: 101.48 mL/min (ref >60.00)
GLUCOSE: 100 mg/dL — AB (ref 70–99)
Potassium: 4.6 mEq/L (ref 3.5–5.1)
Sodium: 140 mEq/L (ref 135–145)

## 2016-09-02 LAB — PSA: PSA: 3.09 ng/mL (ref 0.10–4.00)

## 2016-09-02 LAB — TSH: TSH: 1.77 u[IU]/mL (ref 0.35–4.50)

## 2016-09-02 MED ORDER — TRAZODONE HCL 50 MG PO TABS: 50.0000 mg | ORAL_TABLET | Freq: Every evening | ORAL | 11 refills | Status: DC | PRN

## 2016-09-02 MED ORDER — MELOXICAM 7.5 MG PO TABS: 7.5000 mg | ORAL_TABLET | Freq: Every day | ORAL | 11 refills | Status: DC

## 2016-09-02 NOTE — Progress Notes (Signed)
Subjective:    Patient ID: [name] [name], male    DOB: [birth year]   MRN: [id number]  Brief patient profile:  50  yobm with morbid obesity actively smoking with documented sleep apnea for which he has seen Dr. Halford [name] with no improvement in his weight over the despite previous extensive counseling regarding calorie balance issues     History of Present Illness   09/02/2016  f/u ov/[name] [name] re:  cpx  Chief Complaint  Patient presents with  . Annual Exam    Pt is fasting. He c/o having skin tags under arms and inside of his legs. He states he has had trouble staying asleep, wakes up 3-4 x in the night.   overall less snoring/ some afternoon sleepiness   Off cpap x years  Walking 3 days a week x 30 min on a track  Doesn't understand why can't lose wt  No tia/claudication/doe    No    chronic cough or cp or chest tightness, subjective wheeze or overt sinus or hb symptoms. No unusual exp hx or h/o childhood pna/ asthma or knowledge of premature birth.  Sleeping ok without nocturnal  or early am exacerbation  of respiratory  c/o's or need for noct saba. Also denies any obvious fluctuation of symptoms with weather or environmental changes or other aggravating or alleviating factors except as outlined above   Current Medications, Allergies, Complete Past Medical History, Past Surgical History, Family History, and Social History were reviewed in [facility] record.  ROS  The following are not active complaints unless bolded sore throat, dysphagia, dental problems, itching, sneezing,  nasal congestion or excess/ purulent secretions, ear ache,   fever, chills, sweats, unintended wt loss, classically pleuritic or exertional cp, hemoptysis,  orthopnea pnd or leg swelling, presyncope, palpitations, abdominal pain, anorexia, nausea, vomiting, diarrhea  or change in bowel or bladder habits, change in stools or urine, dysuria,hematuria,  rash, arthralgias, visual complaints,  headache, numbness, weakness or ataxia or problems with walking or coordination,  change in mood/affect or memory.                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           Objective:   Physical Exam    mb pleasant obese bm nad  - vital signs reviewed note bp ok off meds wt 242 April 25, 2009 > 250 August 19, 2010 > 08/25/2011  253 > 06/06/2012  260 > 260 09/06/2012 >262 02/28/2013 > 02/23/2014  276 > 05/19/2014  273>> 09/02/2016  281   HEENT: nl dentition, turbinates, and oropharynx  NECK : without JVD/Nodes/TM/ nl carotid upstrokes bilaterally - LUNGS: no acc muscle use, clear to A and P bilaterally without cough on insp or exp maneuvers  CV: RRR no s3 or murmur or increase in P2, no edema pedal pulses sym  ABD: soft and nontender with nl excursion in the supine position. No bruits or organomegaly, bowel sounds nl  MS: warm without  , calf tenderness, cyanosis or clubbing   Skin: multiple skin tags esp axillae/ inguinal  SKIN: warm and dry without lesions  NEURO:no deficits noted  GU  L testis  absent/ neg IH Rectal:  Difficult to palp entire gland / mod bph changes/ stool G neg       CXR PA and Lateral:   09/02/2016 :    I personally reviewed images and agree with radiology impression as follows:    Mild cardiomegaly.  Lingular scarring.  No active disease.   Labs ordered/ reviewed:      Chemistry      Component Value Date/Time   NA 140 09/02/2016 0937   K 4.6 09/02/2016 0937   CL 102 09/02/2016 0937   CO2 30 09/02/2016 0937   BUN 13 09/02/2016 0937   CREATININE 0.98 09/02/2016 0937      Component Value Date/Time   CALCIUM 10.1 09/02/2016 0937   ALKPHOS 89 09/02/2016 0937   AST 16 09/02/2016 0937   ALT 25 09/02/2016 0937   BILITOT 0.4 09/02/2016 0937        Lab Results  Component Value Date   WBC 13.4 (H) 09/02/2016   HGB 15.3 09/02/2016   HCT 45.8 09/02/2016   MCV 82.7 09/02/2016   PLT 340.0 09/02/2016      Lab Results  Component Value Date   TSH 1.77 09/02/2016     Lab Results  Component Value Date   PROBNP 13.0 12/21/2007           Assessment & Plan:

## 2016-09-02 NOTE — Progress Notes (Signed)
LMTCB

## 2016-09-02 NOTE — Assessment & Plan Note (Signed)
Referred to derm 

## 2016-09-02 NOTE — Patient Instructions (Addendum)
Weight control is simply a matter of calorie balance which needs to be tilted in your favor by eating less and exercising more.  To get the most out of exercise, you need to be continuously aware that you are short of breath, but never out of breath, for 30 minutes daily. As you improve, it will actually be easier for you to do the same amount of exercise  in  30 minutes so always push to the level where you are short of breath.  If this does not result in gradual weight reduction then I strongly recommend you see a nutritionist with a food diary x 2 weeks so that we can work out a negative calorie balance which is universally effective in steady weight loss programs.  Think of your calorie balance like you do your bank account where in this case you want the balance to go down so you must take in less calories than you burn up.  It's just that simple:  Hard to do, but easy to understand.  Good luck!   Resume meloxicam 7.5 mg daily with meals as needed for arthritis  Please see patient coordinator before you leave today  to schedule dermatology eval for skin tag removal  Please remember to go to the lab and x-ray department downstairs for your tests - we will call you with the results when they are available.     Please schedule a follow up visit in 12  months but call sooner if needed for cpx

## 2016-09-03 NOTE — Progress Notes (Signed)
Spoke with pt and notified of results per Dr. Wert. Pt verbalized understanding and denied any questions. 

## 2016-09-04 NOTE — Assessment & Plan Note (Signed)
-   Target LDL < 130   Lab Results  Component Value Date   CHOL 185 09/02/2016   HDL 28.30 (L) 09/02/2016   LDLCALC 118 (H) 02/22/2014   LDLDIRECT 136.0 09/02/2016   TRIG 204.0 (H) 09/02/2016   CHOLHDL 7 09/02/2016    Adequate control on present rx, reviewed > no change in rx needed

## 2016-09-04 NOTE — Assessment & Plan Note (Signed)
-   Target wt < 208 to get BMI under 30    - Referred to nutrition 09/06/2012   Body mass index is 39.25   Lab Results  Component Value Date   TSH 1.77 09/02/2016     Contributing to gerd tendency/ doe/reviewed the need and the process to achieve and maintain neg calorie balance > see avs

## 2016-09-04 NOTE — Assessment & Plan Note (Signed)
>   3 min Discussed the risks and costs (both direct and indirect)  of smoking relative to the benefits of quitting but patient unwilling to commit at this point to a specific quit date.    Although I don't endorse regular use of e cigs/ many pts find them helpful; however, I emphasized they should be considered a "one-way bridge" off all tobacco products.  

## 2017-10-18 ENCOUNTER — Other Ambulatory Visit: Payer: Self-pay | Admitting: Internal Medicine

## 2017-10-18 DIAGNOSIS — M7021 Olecranon bursitis, right elbow: Secondary | ICD-10-CM

## 2017-11-10 ENCOUNTER — Other Ambulatory Visit: Payer: Self-pay | Admitting: Internal Medicine

## 2017-11-10 DIAGNOSIS — M7021 Olecranon bursitis, right elbow: Secondary | ICD-10-CM

## 2017-11-12 ENCOUNTER — Telehealth: Payer: Self-pay | Admitting: Internal Medicine

## 2017-11-12 NOTE — Telephone Encounter (Signed)
Spoke with pt. He is requesting a refill on Meloxicam. Advised him that we have not seen in over 1 year and that he will need an OV before we can refill this medication. Pt has been scheduled to see MW on 11/16/17 at 4:15pm. Nothing further was needed.

## 2017-11-16 ENCOUNTER — Ambulatory Visit (INDEPENDENT_AMBULATORY_CARE_PROVIDER_SITE_OTHER): Payer: BC Managed Care – PPO | Admitting: Internal Medicine

## 2017-11-16 ENCOUNTER — Encounter: Payer: Self-pay | Admitting: Internal Medicine

## 2017-11-16 VITALS — BP 128/76 | HR 90 | Temp 97.3°F | Ht 71.0 in | Wt 286.0 lb

## 2017-11-16 DIAGNOSIS — J209 Acute bronchitis, unspecified: Secondary | ICD-10-CM | POA: Diagnosis not present

## 2017-11-16 DIAGNOSIS — M7021 Olecranon bursitis, right elbow: Secondary | ICD-10-CM

## 2017-11-16 DIAGNOSIS — F17210 Nicotine dependence, cigarettes, uncomplicated: Secondary | ICD-10-CM

## 2017-11-16 MED ORDER — MELOXICAM 7.5 MG PO TABS: 7.5000 mg | ORAL_TABLET | Freq: Every day | ORAL | 11 refills | Status: DC

## 2017-11-16 MED ORDER — AZITHROMYCIN 250 MG PO TABS: ORAL_TABLET | ORAL | 0 refills | Status: DC

## 2017-11-16 MED ORDER — ALBUTEROL SULFATE HFA 108 (90 BASE) MCG/ACT IN AERS: 2.0000 | INHALATION_SPRAY | RESPIRATORY_TRACT | 1 refills | Status: DC | PRN

## 2017-11-16 MED ORDER — PREDNISONE 10 MG PO TABS: ORAL_TABLET | ORAL | 0 refills | Status: DC

## 2017-11-16 NOTE — Patient Instructions (Addendum)
For cough/ congestion > mucinex dm up to 1200 mg every 12 hours as needed   zpak   For wheeze/ short of breath >> albuterol inhaler up to 2 puff every 4 hours as needed   If not improving > Prednisone 10 mg take  4 each am x 2 days,   2 each am x 2 days,  1 each am x 2 days and stop    Please schedule a follow up visit in 3 months but call sooner if needed for CPX  FASTING

## 2017-11-16 NOTE — Progress Notes (Signed)
Subjective:    Patient ID: [name] [name], male    DOB: 14-Dec-1960   MRN: [id number]  Brief patient profile:  57  yobm with morbid obesity actively smoking with documented sleep apnea for which he has seen Dr. Halford Chessman with no improvement in his weight over the despite previous extensive counseling regarding calorie balance issues     History of Present Illness   09/02/2016  f/u ov/Wert re:  cpx  Chief Complaint  Patient presents with  . Annual Exam    Pt is fasting. He c/o having skin tags under arms and inside of his legs. He states he has had trouble staying asleep, wakes up 3-4 x in the night.   overall less snoring/ some afternoon sleepiness   Off cpap x years  Walking 3 days a week x 30 min on a track  Doesn't understand why can't lose wt rec Weight control   Resume meloxicam 7.5 mg daily with meals as needed for arthritis Please see patient coordinator before you leave today  to schedule dermatology eval for skin tag removal       11/16/2017  Acute ov/Wert re:  DJD/ new acute cough x 10 days  Chief Complaint  Patient presents with  . Acute Visit    Appt scheduled to get refill on meloxicam for hip pain. He states he has had a cold for the past wk. He c/o runny nose, cough and HA. His cough is mainly non prod but he will occ produce some clear sputum. He has an albuterol inhaler but it's expired and he has not really felt like he needs it.    noct cough /wheeze  mucus clear since acute  onset of uri x one week with rhinorrhea, has used saba in past prn but rx ran out/ rarely needs despite ongoing smoking   No obvious day to day or daytime variability or assoc  purulent sputum or mucus plugs or hemoptysis or cp or chest tightness, subjective wheeze or overt   hb symptoms. No unusual exposure hx or h/o childhood pna/ asthma or knowledge of premature birth.  Sleeping ok flat without nocturnal  or early am exacerbation  of respiratory  c/o's or need for noct saba. Also denies any  obvious fluctuation of symptoms with weather or environmental changes or other aggravating or alleviating factors except as outlined above   Current Allergies, Complete Past Medical History, Past Surgical History, Family History, and Social History were reviewed in [facility] record.  ROS  The following are not active complaints unless bolded Hoarseness, sore throat, dysphagia, dental problems, itching, sneezing,  nasal congestion or discharge of excess mucus or purulent secretions, ear ache,   fever, chills, sweats, unintended wt loss or wt gain, classically pleuritic or exertional cp,  orthopnea pnd or leg swelling, presyncope, palpitations, abdominal pain, anorexia, nausea, vomiting, diarrhea  or change in bowel habits or change in bladder habits, change in stools or change in urine, dysuria, hematuria,  rash, arthralgias, visual complaints, headache, numbness, weakness or ataxia or problems with walking or coordination,  change in mood/affect or memory.        Current Meds  Medication Sig  . albuterol (PROVENTIL HFA;VENTOLIN HFA) 108 (90 Base) MCG/ACT inhaler Inhale 2 puffs into the lungs every 4 (four) hours as needed for wheezing or shortness of breath (cough, shortness of breath or wheezing.).  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 Nl external ear canals without cough reflex   NECK :  without JVD/Nodes/TM/ nl carotid upstrokes bilaterally   LUNGS: no acc muscle use,  Nl contour chest with bilateral distant exp rhonchi/ cough mid / late exp    CV:  RRR  no s3 or murmur or increase in P2, and no edema   ABD:  soft and nontender with nl inspiratory excursion in the supine position. No bruits or organomegaly appreciated, bowel sounds nl  MS:  Nl gait/ ext warm without deformities, calf tenderness, cyanosis or clubbing No obvious joint restrictions   SKIN: warm and dry without lesions    NEURO:  alert, approp, nl sensorium with  no motor or cerebellar deficits apparent.                         Assessment & Plan:

## 2017-11-17 ENCOUNTER — Encounter: Payer: Self-pay | Admitting: Internal Medicine

## 2017-11-17 NOTE — Assessment & Plan Note (Signed)
Refilled meloxicam, advised to f/u with ortho prn if not easily controlled on mobic or has to use it daily

## 2017-11-17 NOTE — Assessment & Plan Note (Signed)
In setting of active smoking and rhonchi on exam probably viral but cover with zpak and prn saba  - The proper method of use, as well as anticipated side effects, of a metered-dose inhaler are discussed and demonstrated to the patient.     - if not better add zpak  - needs to return for cpx/ prevnar and stop smoking (see separate a/p)   I had an extended discussion with the patient reviewing all relevant studies completed to date and  lasting 15 to 20 minutes of a 25 minute acute  visit    Each maintenance medication was reviewed in detail including most importantly the difference between maintenance and prns and under what circumstances the prns are to be triggered using an action plan format that is not reflected in the computer generated alphabetically organized AVS.    Please see AVS for specific instructions unique to this visit that I personally wrote and verbalized to the the pt in detail and then reviewed with pt  by my nurse highlighting any  changes in therapy recommended at today's visit to their plan of care.

## 2017-11-17 NOTE — Assessment & Plan Note (Signed)
>   3 min Discussed the risks and costs (both direct and indirect)  of smoking relative to the benefits of quitting but patient unwilling to commit at this point to a specific quit date.       

## 2018-01-11 ENCOUNTER — Encounter (HOSPITAL_COMMUNITY): Payer: Self-pay | Admitting: Emergency Medicine

## 2018-01-11 ENCOUNTER — Emergency Department (HOSPITAL_COMMUNITY): Payer: BC Managed Care – PPO

## 2018-01-11 DIAGNOSIS — Z7982 Long term (current) use of aspirin: Secondary | ICD-10-CM | POA: Insufficient documentation

## 2018-01-11 DIAGNOSIS — R112 Nausea with vomiting, unspecified: Secondary | ICD-10-CM | POA: Diagnosis not present

## 2018-01-11 DIAGNOSIS — R0789 Other chest pain: Secondary | ICD-10-CM | POA: Insufficient documentation

## 2018-01-11 DIAGNOSIS — F17210 Nicotine dependence, cigarettes, uncomplicated: Secondary | ICD-10-CM | POA: Insufficient documentation

## 2018-01-11 DIAGNOSIS — Z6839 Body mass index (BMI) 39.0-39.9, adult: Secondary | ICD-10-CM | POA: Insufficient documentation

## 2018-01-11 LAB — BASIC METABOLIC PANEL
ANION GAP: 9 (ref 5–15)
BUN: 14 mg/dL (ref 6–20)
CALCIUM: 9.2 mg/dL (ref 8.9–10.3)
CO2: 28 mmol/L (ref 22–32)
Chloride: 104 mmol/L (ref 101–111)
Creatinine, Ser: 0.89 mg/dL (ref 0.61–1.24)
GFR calc Af Amer: >60 mL/min (ref >60)
GLUCOSE: 139 mg/dL — AB (ref 65–99)
Potassium: 4.1 mmol/L (ref 3.5–5.1)
SODIUM: 141 mmol/L (ref 135–145)

## 2018-01-11 LAB — CBC
HCT: 45.1 % (ref 39.0–52.0)
HEMOGLOBIN: 14.6 g/dL (ref 13.0–17.0)
MCH: 28 pg (ref 26.0–34.0)
MCHC: 32.4 g/dL (ref 30.0–36.0)
MCV: 86.6 fL (ref 78.0–100.0)
Platelets: 293 10*3/uL (ref 150–400)
RBC: 5.21 MIL/uL (ref 4.22–5.81)
RDW: 14.8 % (ref 11.5–15.5)
WBC: 15.6 10*3/uL — ABNORMAL HIGH (ref 4.0–10.5)

## 2018-01-11 LAB — I-STAT TROPONIN, ED: Troponin i, poc: 0 ng/mL (ref 0.00–0.08)

## 2018-01-11 NOTE — ED Triage Notes (Signed)
Patient c/o left side chest pain radiating to bilateral shoulder since 1600 today. States pain worsened after dinner and worsens with movement. Denies SOB, dizziness, N/V.

## 2018-01-12 ENCOUNTER — Emergency Department (HOSPITAL_COMMUNITY)
Admission: EM | Admit: 2018-01-12 | Discharge: 2018-01-12 | Disposition: A | Discharge status: Home / Self Care | Payer: BC Managed Care – PPO | Attending: Emergency Medicine | Admitting: Emergency Medicine

## 2018-01-12 DIAGNOSIS — R0789 Other chest pain: Secondary | ICD-10-CM

## 2018-01-12 LAB — D-DIMER, QUANTITATIVE (NOT AT ARMC): D DIMER QUANT: 0.52 ug{FEU}/mL — AB (ref 0.00–0.50)

## 2018-01-12 LAB — I-STAT TROPONIN, ED: TROPONIN I, POC: 0 ng/mL (ref 0.00–0.08)

## 2018-01-12 NOTE — ED Provider Notes (Signed)
[facility] DEPT Provider Note   CSN: [id number] Arrival date & time: 01/11/18  1947     History   Chief Complaint Chief Complaint  Patient presents with  . Chest Pain    HPI [name] [name] is a 57 y.o. male.  Patient with history of high cholesterol, obesity, smoking --presents with acute onset of chest pain with radiation to his back and shoulders while at rest.  The pain began more mild in nature back while he was leaving work at approximately 5 PM.  At about 630 or so, the pain became worse.  Patient had vomiting because of the pain.  He denies shortness of breath or diaphoresis.  No treatments prior to arrival.  Symptoms have gradually improved during his wait in the emergency department tonight.  He denies any fevers or chills.  No recent cough.  He denies any injury to his chest.  Denies any previous history of stress testing.  States that his mother had congestive heart failure and open heart surgery.  No lightheadedness or syncope.  No abdominal pain, diarrhea.  Not made worse with food but is somewhat worse with movement. Patient has not had similar symptoms in the past. Patient denies risk factors for pulmonary embolism including: unilateral leg swelling, history of DVT/PE/other blood clots, use of exogenous hormones, recent immobilizations, recent surgery, recent travel (>4hr segment), malignancy, hemoptysis.  The onset of this condition was acute. The course is improving.  Aggravating factors: none. Alleviating factors: none.        Past Medical History:  Diagnosis Date  . Arthritis   . Epididymitis   . Hyperlipidemia   . Morbid obesity ([place])   . OSA (obstructive sleep apnea)     Patient Active Problem List   Diagnosis Date Noted  . Healthcare maintenance 09/02/2016  . Skin tag 09/02/2016  . Hordeolum 08/15/2015  . Olecranon bursitis of right elbow 05/18/2014  . Other malaise and fatigue 02/22/2014  . Acute bronchitis 02/28/2013  .  Multinodular goiter 09/21/2012  . Sciatica 09/06/2012  . Cough 06/06/2012  . Cigarette smoker 08/19/2010  . BENIGN PROSTATIC HYPERTROPHY, HX OF 08/19/2010  . HYPERCHOLESTEROLEMIA 04/25/2009  . Morbid obesity ([place]) 09/06/2007  . OBSTRUCTIVE SLEEP APNEA 09/06/2007    Past Surgical History:  Procedure Laterality Date  . TESTICLE REMOVAL  age 60       Home Medications    Prior to Admission medications   Medication Sig Start Date End Date Taking? Authorizing Provider  aspirin 81 MG tablet Take 81 mg by mouth daily.    Yes [provider]  Cyanocobalamin (VITAMIN B 12) 250 MCG LOZG Take 1 tablet by mouth daily.   Yes [provider]  meloxicam (MOBIC) 7.5 MG tablet Take 1 tablet (7.5 mg total) by mouth daily. 11/16/17  Yes Tanda Rockers, MD  albuterol (PROVENTIL HFA;VENTOLIN HFA) 108 (90 Base) MCG/ACT inhaler Inhale 2 puffs into the lungs every 4 (four) hours as needed for wheezing or shortness of breath (cough, shortness of breath or wheezing.). Patient not taking: Reported on 01/12/2018 11/16/17   Tanda Rockers, MD  azithromycin [facility]) 250 MG tablet Take 2 on day one then 1 daily x 4 days Patient not taking: Reported on 01/12/2018 11/16/17   Tanda Rockers, MD  predniSONE (DELTASONE) 10 MG tablet Take  4 each am x 2 days,   2 each am x 2 days,  1 each am x 2 days and stop Patient not taking: Reported on  01/12/2018 11/16/17   Tanda Rockers, MD  traZODone (DESYREL) 50 MG tablet Take 1 tablet (50 mg total) by mouth at bedtime as needed. Patient not taking: Reported on 11/16/2017 09/02/16   Tanda Rockers, MD    Family History Family History  Problem Relation Age of Onset  . Coronary artery disease Mother 27  . Diabetes Mother   . Heart disease Mother   . Hyperlipidemia Mother   . Diabetes Father   . Heart disease Father   . Diabetes Brother        older brother  . Cancer Neg Hx     Social History Social History   Tobacco Use  . Smoking status: Current  Every Day Smoker    Packs/day: 0.50    Years: 25.00    Pack years: 12.50    Types: Cigarettes  . Smokeless tobacco: Never Used  Substance Use Topics  . Alcohol use: Yes    Alcohol/week: 1.5 - 2.0 oz    Types: 3 - 4 Standard drinks or equivalent per week  . Drug use: No     Allergies   Patient has no known allergies.   Review of Systems Review of Systems  Constitutional: Negative for diaphoresis and fever.  Eyes: Negative for redness.  Respiratory: Negative for cough and shortness of breath.   Cardiovascular: Positive for chest pain. Negative for palpitations and leg swelling.  Gastrointestinal: Positive for nausea and vomiting. Negative for abdominal pain.  Genitourinary: Negative for dysuria.  Musculoskeletal: Positive for myalgias. Negative for back pain and neck pain.  Skin: Negative for rash.  Neurological: Negative for syncope and light-headedness.  Psychiatric/Behavioral: The patient is not nervous/anxious.      Physical Exam Updated Vital Signs BP (!) 157/86 (BP Location: Left Arm)   Pulse 89   Resp 18   Ht 5\' 11"  (1.803 m)   Wt 127 kg (280 lb)   SpO2 96%   BMI 39.05 kg/m   Physical Exam  Constitutional: He appears well-developed and well-nourished.  HENT:  Head: Normocephalic and atraumatic.  Mouth/Throat: Mucous membranes are normal. Mucous membranes are not dry.  Eyes: Conjunctivae are normal.  Neck: Trachea normal and normal range of motion. Neck supple. Normal carotid pulses and no JVD present. No muscular tenderness present. Carotid bruit is not present. No tracheal deviation present.  Cardiovascular: Normal rate, regular rhythm, S1 normal, S2 normal, normal heart sounds and intact distal pulses. Exam reveals no distant heart sounds and no decreased pulses.  No murmur heard. Pulmonary/Chest: Effort normal and breath sounds normal. No respiratory distress. He has no wheezes. He exhibits no tenderness.  Abdominal: Soft. Normal aorta and bowel sounds  are normal. There is no tenderness. There is no rebound and no guarding.  Musculoskeletal: He exhibits no edema.  Neurological: He is alert.  Skin: Skin is warm and dry. He is not diaphoretic. No cyanosis. No pallor.  Psychiatric: He has a normal mood and affect.  Nursing note and vitals reviewed.    ED Treatments / Results  Labs (all labs ordered are listed, but only abnormal results are displayed) Labs Reviewed  BASIC METABOLIC PANEL - Abnormal; Notable for the following components:      Result Value   Glucose, Bld 139 (*)    All other components within normal limits  CBC - Abnormal; Notable for the following components:   WBC 15.6 (*)    All other components within normal limits  D-DIMER, QUANTITATIVE (NOT AT [facility]) - Abnormal; Notable  for the following components:   D-Dimer, Quant 0.52 (*)    All other components within normal limits  I-STAT TROPONIN, ED  I-STAT TROPONIN, ED    EKG  EKG Interpretation  Date/Time:  Tuesday January 11 2018 19:51:51 EST Ventricular Rate:  82 PR Interval:    QRS Duration: 96 QT Interval:  376 QTC Calculation: 440 R Axis:   -100 Text Interpretation:  Sinus rhythm Left anterior fascicular block Abnormal R-wave progression, late transition ST elevation, consider inferior injury Baseline wander in lead(s) V3 Confirmed by Veryl Speak [id number] on 01/12/2018 2:46:27 AM       Radiology Dg Chest 2 View  Result Date: 01/11/2018 CLINICAL DATA:  Chest pain radiating to neck and LEFT arm.  Smoker. EXAM: CHEST - 2 VIEW COMPARISON:  Chest radiograph September 02, 2016 and chest radiograph June 06, 2012. FINDINGS: The cardiac silhouette is mildly enlarged and unchanged. Mediastinal silhouette is non suspicious. Similar strandy densities LEFT lung base with blunted costophrenic angle. Persistent tracheal deviation to the RIGHT due to enlarged LEFT thyroid as seen on thyroid sonogram June 13, 2012. No pneumothorax. Chronic deformity RIGHT distal clavicle.  IMPRESSION: Mild cardiomegaly. Persistent LEFT lung base atelectasis or scarring with pleural thickening. Electronically Signed   By: Elon Alas M.D.   On: 01/11/2018 20:49    Procedures Procedures (including critical care time)  Medications Ordered in ED Medications - No data to display   Initial Impression / Assessment and Plan / ED Course  I have reviewed the triage vital signs and the nursing notes.  Pertinent labs & imaging results that were available during my care of the patient were reviewed by me and considered in my medical decision making (see chart for details).     Patient seen and examined.  He is frustrated by wait time in the ED.  EKG reviewed.  Is abnormal but similar to 2017.  Reviewed with Dr. Stark Jock.  Will send second troponin and a d-dimer.  Vital signs reviewed and are as follows: BP 140/84   Pulse 77   Resp (!) 22   Ht 5\' 11"  (1.803 m)   Wt 127 kg (280 lb)   SpO2 92%   BMI 39.05 kg/m   Repeat EKG again abnormal but unchanged from previous. Reviewed with Dr. Stark Jock. No STEMI.   ED ECG REPORT   Date: 01/12/2018  Rate: 83  Rhythm: normal sinus rhythm  QRS Axis: left  Intervals: normal  ST/T Wave abnormalities: nonspecific ST/T changes  Conduction Disutrbances:none  Narrative Interpretation:   Old EKG Reviewed: unchanged  I have personally reviewed the EKG tracing and agree with the computerized printout as noted.  5:05 AM Troponin neg x 2. D-dimer 0.52 -- age-adjusted cutoff 0.58 (age 78).   Pt updated on results.  States some improvement in pain now that he has had a chance to sleep.  Discussed atypical nature of the patient's symptoms.  Discussed that he would likely benefit from stress test given risk factors and family history if deemed necessary by PCP or cardiology.  Referral given.  Patient states that he will follow-up.  Patient was counseled to return with severe chest pain, especially if the pain is crushing or pressure-like and  spreads to the arms, back, neck, or jaw, or if they have sweating, nausea, or shortness of breath with the pain. They were encouraged to call 911 with these symptoms.   They were also told to return if their chest pain gets worse and does not go  away with rest, they have an attack of chest pain lasting longer than usual despite rest and treatment with the medications their caregiver has prescribed, if they wake from sleep with chest pain or shortness of breath, if they feel dizzy or faint, if they have chest pain not typical of their usual pain, or if they have any other emergent concerns regarding their health.  The patient verbalized understanding and agreed.     Final Clinical Impressions(s) / ED Diagnoses   Final diagnoses:  Atypical chest pain   Patient with chest pain starting at rest, nonexertional, with radiation to the back and shoulders.  It is positional in nature.  No significant shortness of breath.  No significant risk factors for DVT or PE.  D-dimer less than age-adjusted cutoff of 0.58.  No tachycardia or hypoxia to suggest PE.  Do not feel that imaging is indicated given these findings.  EKG is abnormal but unchanged from 2017.  It is stable in the emergency department x2.  Troponin negative x2.  Feel patient is low risk for ACS.  We discussed possibility of angina and it patient needs to monitor symptoms closely, and follow-up as described above.  He may warrant stress testing.  Do not feel that patient necessarily requires admission to the hospital tonight.  He does not want to stay.  Heart score = 3.  Discussed return precautions as described above.  Patient seems reliable to return if symptoms worsen.  Do not suspect aortic dissection given lack of CP with additional symptoms.   ED Discharge Orders    None       Carlisle Cater, PA-C 01/12/18 2023    Veryl Speak, MD 01/12/18 [phone]

## 2018-01-12 NOTE — Discharge Instructions (Signed)
Please read and follow all provided instructions.  Your diagnoses today include:  1. Atypical chest pain    Tests performed today include:  An EKG of your heart - not changed from previous EKGs  A chest x-ray - no pneumonia or acute problems  Cardiac enzymes - a blood test for heart muscle damage, no sign of heart irritation  Blood counts and electrolytes  Vital signs. See below for your results today.   Medications prescribed:   None  Take any prescribed medications only as directed.  Follow-up instructions: Please follow-up with your primary care provider as soon as you can for further evaluation of your symptoms.  Talk to your regular doctor and cardiologist listed to see if they feel a stress test is warranted.  Return instructions:  SEEK IMMEDIATE MEDICAL ATTENTION IF:  You have severe chest pain, especially if the pain is crushing or pressure-like and spreads to the arms, back, neck, or jaw, or if you have sweating, nausea (feeling sick to your stomach), or shortness of breath. THIS IS AN EMERGENCY. Don't wait to see if the pain will go away. Get medical help at once. Call 911 or 0 (operator). DO NOT drive yourself to the hospital.   Your chest pain gets worse and does not go away with rest.   You have an attack of chest pain lasting longer than usual, despite rest and treatment with the medications your caregiver has prescribed.   You wake from sleep with chest pain or shortness of breath.  You feel dizzy or faint.  You have chest pain not typical of your usual pain for which you originally saw your caregiver.   You have any other emergent concerns regarding your health.  Additional Information: Chest pain comes from many different causes. Your caregiver has diagnosed you as having chest pain that is not specific for one problem, but does not require admission.  You are at low risk for an acute heart condition or other serious illness.   Your vital signs today  were: BP 140/84    Pulse 77    Resp (!) 22    Ht 5\' 11"  (1.803 m)    Wt 127 kg (280 lb)    SpO2 92%    BMI 39.05 kg/m  If your blood pressure (BP) was elevated above 135/85 this visit, please have this repeated by your doctor within one month. --------------

## 2018-02-15 ENCOUNTER — Ambulatory Visit: Payer: BC Managed Care – PPO | Admitting: Internal Medicine

## 2018-03-19 ENCOUNTER — Ambulatory Visit (INDEPENDENT_AMBULATORY_CARE_PROVIDER_SITE_OTHER): Payer: BC Managed Care – PPO | Admitting: Family Medicine

## 2018-03-19 ENCOUNTER — Encounter: Payer: Self-pay | Admitting: Family Medicine

## 2018-03-19 VITALS — BP 138/88 | HR 83 | Temp 98.7°F | Resp 18

## 2018-03-19 DIAGNOSIS — B9689 Other specified bacterial agents as the cause of diseases classified elsewhere: Secondary | ICD-10-CM

## 2018-03-19 DIAGNOSIS — J208 Acute bronchitis due to other specified organisms: Secondary | ICD-10-CM

## 2018-03-19 MED ORDER — AZITHROMYCIN 250 MG PO TABS: ORAL_TABLET | ORAL | 0 refills | Status: DC

## 2018-03-19 MED ORDER — BENZONATATE 100 MG PO CAPS: 100.0000 mg | ORAL_CAPSULE | Freq: Two times a day (BID) | ORAL | 0 refills | Status: DC | PRN

## 2018-03-19 NOTE — Progress Notes (Signed)
Subjective  CC:  Chief Complaint  Patient presents with  . Sore Throat    Head congestion xs 4 days, runny nose, green production from nose, dry cough.     HPI: SUBJECTIVE:  [name] [name] is a 57 y.o. male who complains of congestion, nasal blockage, post nasal drip, cough described as productive of green sputum and denies sinus, high fevers, SOB, chest pain or significant GI symptoms. Symptoms have been present for 3-4 days. He denies a history of anorexia, dizziness, vomiting and wheezing. He denies a history of asthma or COPD. Patient does and does not smoke cigarettes.  I reviewed the patients updated PMH, FH, and SocHx.  Social History: Patient  reports that he has been smoking cigarettes.  He has a 12.50 pack-year smoking history. He has never used smokeless tobacco. He reports that he drinks about 1.5 - 2.0 oz of alcohol per week. He reports that he does not use drugs.  Patient Active Problem List   Diagnosis Date Noted  . Healthcare maintenance 09/02/2016  . Skin tag 09/02/2016  . Hordeolum 08/15/2015  . Olecranon bursitis of right elbow 05/18/2014  . Other malaise and fatigue 02/22/2014  . Acute bronchitis 02/28/2013  . Multinodular goiter 09/21/2012  . Sciatica 09/06/2012  . Cough 06/06/2012  . Cigarette smoker 08/19/2010  . BENIGN PROSTATIC HYPERTROPHY, HX OF 08/19/2010  . HYPERCHOLESTEROLEMIA 04/25/2009  . Morbid obesity ([place]) 09/06/2007  . OBSTRUCTIVE SLEEP APNEA 09/06/2007    Review of Systems: Cardiovascular: negative for chest pain Respiratory: negative for SOB or hemoptysis Gastrointestinal: negative for abdominal pain Genitourinary: negative for dysuria or gross hematuria Current Meds  Medication Sig  . aspirin 81 MG tablet Take 81 mg by mouth daily.   . Cyanocobalamin (VITAMIN B 12) 250 MCG LOZG Take 1 tablet by mouth daily.  . meloxicam (MOBIC) 7.5 MG tablet Take 1 tablet (7.5 mg total) by mouth daily.    Objective  Vitals: BP 138/88   Pulse 83    Temp 98.7 F (37.1 C) (Oral)   Resp 18   SpO2 94%  General: no acute distress  Psych:  Alert and oriented, normal mood and affect HEENT:  Normocephalic, atraumatic, supple neck, moist mucous membranes, mildly erythematous pharynx without exudate, mild lymphadenopathy, supple neck Cardiovascular:  RRR without murmur. no edema Respiratory:  Good breath sounds bilaterally, CTAB with normal respiratory effort without rales , wheezing or rhonchi Skin:  Warm, no rashes Neurologic:   Mental status is normal. normal gait  Assessment  1. Acute bacterial bronchitis      Plan  Discussion:  Treat for bacterial bronchitis due to pt's strong preference. Education regarding differences between viral and bacterial infections and treatment options are discussed.  Supportive care measures are recommended.  We discussed the use of mucolytic's, decongestants, antihistamines and antitussives as needed.  Tylenol or Advil are recommended if needed.  Follow up: Return if symptoms worsen or fail to improve.   Commons side effects, risks, benefits, and alternatives for medications and treatment plan prescribed today were discussed, and the patient expressed understanding of the given instructions. Patient is instructed to call or message via MyChart if he/she has any questions or concerns regarding our treatment plan. No barriers to understanding were identified. We discussed Red Flag symptoms and signs in detail. Patient expressed understanding regarding what to do in case of urgent or emergency type symptoms.  Medication list was reconciled, printed and provided to the patient in AVS. Patient instructions and summary information was reviewed  with the patient as documented in the AVS. This note was prepared with assistance of Dragon voice recognition software. Occasional wrong-word or sound-a-like substitutions may have occurred due to the inherent limitations of voice recognition software  No orders of the  defined types were placed in this encounter.  Meds ordered this encounter  Medications  . benzonatate (TESSALON) 100 MG capsule    Sig: Take 1 capsule (100 mg total) by mouth 2 (two) times daily as needed for cough.    Dispense:  20 capsule    Refill:  0  . azithromycin (ZITHROMAX) 250 MG tablet    Sig: Take 2 tabs today, then 1 tab daily for 4 days    Dispense:  1 each    Refill:  0

## 2018-03-19 NOTE — Patient Instructions (Addendum)
Please follow up if symptoms do not improve or as needed.   Take the antibiotics as directed: if this is caused by a bacteria it will help; if it is caused by a virus, it will not.  The tessalon perles can be used to help suppress the cough.  You may use Delsym cough syrup or Mucinex DM to help with congestion and coughing.   Acute Bronchitis, Adult Acute bronchitis is sudden (acute) swelling of the air tubes (bronchi) in the lungs. Acute bronchitis causes these tubes to fill with mucus, which can make it hard to breathe. It can also cause coughing or wheezing. In adults, acute bronchitis usually goes away within 2 weeks. A cough caused by bronchitis may last up to 3 weeks. Smoking, allergies, and asthma can make the condition worse. Repeated episodes of bronchitis may cause further lung problems, such as chronic obstructive pulmonary disease (COPD). What are the causes? This condition can be caused by germs and by substances that irritate the lungs, including:  Cold and flu viruses. This condition is most often caused by the same virus that causes a cold.  Bacteria.  Exposure to tobacco smoke, dust, fumes, and air pollution.  What increases the risk? This condition is more likely to develop in people who:  Have close contact with someone with acute bronchitis.  Are exposed to lung irritants, such as tobacco smoke, dust, fumes, and vapors.  Have a weak immune system.  Have a respiratory condition such as asthma.  What are the signs or symptoms? Symptoms of this condition include:  A cough.  Coughing up clear, yellow, or green mucus.  Wheezing.  Chest congestion.  Shortness of breath.  A fever.  Body aches.  Chills.  A sore throat.  How is this diagnosed? This condition is usually diagnosed with a physical exam. During the exam, your health care provider may order tests, such as chest X-rays, to rule out other conditions. He or she may also:  Test a sample of your  mucus for bacterial infection.  Check the level of oxygen in your blood. This is done to check for pneumonia.  Do a chest X-ray or lung function testing to rule out pneumonia and other conditions.  Perform blood tests.  Your health care provider will also ask about your symptoms and medical history. How is this treated? Most cases of acute bronchitis clear up over time without treatment. Your health care provider may recommend:  Drinking more fluids. Drinking more makes your mucus thinner, which may make it easier to breathe.  Taking a medicine for a fever or cough.  Taking an antibiotic medicine.  Using an inhaler to help improve shortness of breath and to control a cough.  Using a cool mist vaporizer or humidifier to make it easier to breathe.  Follow these instructions at home: Medicines  Take over-the-counter and prescription medicines only as told by your health care provider.  If you were prescribed an antibiotic, take it as told by your health care provider. Do not stop taking the antibiotic even if you start to feel better. General instructions  Get plenty of rest.  Drink enough fluids to keep your urine clear or pale yellow.  Avoid smoking and secondhand smoke. Exposure to cigarette smoke or irritating chemicals will make bronchitis worse. If you smoke and you need help quitting, ask your health care provider. Quitting smoking will help your lungs heal faster.  Use an inhaler, cool mist vaporizer, or humidifier as told by your  health care provider.  Keep all follow-up visits as told by your health care provider. This is important. How is this prevented? To lower your risk of getting this condition again:  Wash your hands often with soap and water. If soap and water are not available, use hand sanitizer.  Avoid contact with people who have cold symptoms.  Try not to touch your hands to your mouth, nose, or eyes.  Make sure to get the flu shot every  year.  Contact a health care provider if:  Your symptoms do not improve in 2 weeks of treatment. Get help right away if:  You cough up blood.  You have chest pain.  You have severe shortness of breath.  You become dehydrated.  You faint or keep feeling like you are going to faint.  You keep vomiting.  You have a severe headache.  Your fever or chills gets worse. This information is not intended to replace advice given to you by your health care provider. Make sure you discuss any questions you have with your health care provider. Document Released: 12/03/2004 Document Revised: 05/20/2016 Document Reviewed: 04/15/2016 Elsevier Interactive Patient Education  [employer].

## 2018-03-24 ENCOUNTER — Other Ambulatory Visit (INDEPENDENT_AMBULATORY_CARE_PROVIDER_SITE_OTHER): Payer: BC Managed Care – PPO

## 2018-03-24 ENCOUNTER — Ambulatory Visit (INDEPENDENT_AMBULATORY_CARE_PROVIDER_SITE_OTHER)
Admission: RE | Admit: 2018-03-24 | Discharge: 2018-03-24 | Disposition: A | Discharge status: Home / Self Care | Payer: BC Managed Care – PPO | Source: Ambulatory Visit | Attending: Internal Medicine | Admitting: Internal Medicine

## 2018-03-24 ENCOUNTER — Encounter: Payer: Self-pay | Admitting: Internal Medicine

## 2018-03-24 ENCOUNTER — Ambulatory Visit: Payer: BC Managed Care – PPO | Admitting: Internal Medicine

## 2018-03-24 DIAGNOSIS — E7800 Pure hypercholesterolemia, unspecified: Secondary | ICD-10-CM

## 2018-03-24 DIAGNOSIS — Z0000 Encounter for general adult medical examination without abnormal findings: Secondary | ICD-10-CM

## 2018-03-24 DIAGNOSIS — F17210 Nicotine dependence, cigarettes, uncomplicated: Secondary | ICD-10-CM

## 2018-03-24 DIAGNOSIS — R058 Other specified cough: Secondary | ICD-10-CM | POA: Insufficient documentation

## 2018-03-24 DIAGNOSIS — R05 Cough: Secondary | ICD-10-CM

## 2018-03-24 DIAGNOSIS — R059 Cough, unspecified: Secondary | ICD-10-CM

## 2018-03-24 LAB — CBC WITH DIFFERENTIAL/PLATELET
BASOS ABS: 0.1 10*3/uL (ref 0.0–0.1)
Basophils Relative: 0.9 % (ref 0.0–3.0)
Eosinophils Absolute: 0.2 10*3/uL (ref 0.0–0.7)
Eosinophils Relative: 1.9 % (ref 0.0–5.0)
HEMATOCRIT: 45 % (ref 39.0–52.0)
Hemoglobin: 14.6 g/dL (ref 13.0–17.0)
LYMPHS PCT: 15.5 % (ref 12.0–46.0)
Lymphs Abs: 1.9 10*3/uL (ref 0.7–4.0)
MCHC: 32.4 g/dL (ref 30.0–36.0)
MCV: 84.4 fl (ref 78.0–100.0)
MONOS PCT: 7.6 % (ref 3.0–12.0)
Monocytes Absolute: 0.9 10*3/uL (ref 0.1–1.0)
NEUTROS ABS: 8.9 10*3/uL — AB (ref 1.4–7.7)
Neutrophils Relative %: 74.1 % (ref 43.0–77.0)
PLATELETS: 308 10*3/uL (ref 150.0–400.0)
RBC: 5.33 Mil/uL (ref 4.22–5.81)
RDW: 16.1 % — ABNORMAL HIGH (ref 11.5–15.5)
WBC: 12 10*3/uL — AB (ref 4.0–10.5)

## 2018-03-24 LAB — HEPATIC FUNCTION PANEL
ALT: 13 U/L (ref 0–53)
AST: 10 U/L (ref 0–37)
Albumin: 3.7 g/dL (ref 3.5–5.2)
Alkaline Phosphatase: 81 U/L (ref 39–117)
BILIRUBIN DIRECT: 0.1 mg/dL (ref 0.0–0.3)
BILIRUBIN TOTAL: 0.3 mg/dL (ref 0.2–1.2)
TOTAL PROTEIN: 6.9 g/dL (ref 6.0–8.3)

## 2018-03-24 LAB — BASIC METABOLIC PANEL
BUN: 11 mg/dL (ref 6–23)
CHLORIDE: 102 meq/L (ref 96–112)
CO2: 32 mEq/L (ref 19–32)
CREATININE: 0.9 mg/dL (ref 0.40–1.50)
Calcium: 9.3 mg/dL (ref 8.4–10.5)
GFR: 111.35 mL/min (ref >60.00)
GLUCOSE: 96 mg/dL (ref 70–99)
POTASSIUM: 4.9 meq/L (ref 3.5–5.1)
Sodium: 141 mEq/L (ref 135–145)

## 2018-03-24 LAB — LIPID PANEL
CHOL/HDL RATIO: 6
Cholesterol: 147 mg/dL (ref 0–200)
HDL: 26 mg/dL — ABNORMAL LOW (ref >39.00)
LDL CALC: 93 mg/dL (ref 0–99)
NONHDL: 121.39
TRIGLYCERIDES: 141 mg/dL (ref 0.0–149.0)
VLDL: 28.2 mg/dL (ref 0.0–40.0)

## 2018-03-24 LAB — URINALYSIS
Bilirubin Urine: NEGATIVE
Ketones, ur: NEGATIVE
LEUKOCYTES UA: NEGATIVE
NITRITE: NEGATIVE
PH: 6 (ref 5.0–8.0)
SPECIFIC GRAVITY, URINE: 1.025 (ref 1.000–1.030)
TOTAL PROTEIN, URINE-UPE24: NEGATIVE
Urine Glucose: NEGATIVE
Urobilinogen, UA: 1 (ref 0.0–1.0)

## 2018-03-24 LAB — PSA: PSA: 3.15 ng/mL (ref 0.10–4.00)

## 2018-03-24 LAB — TSH: TSH: 1.71 u[IU]/mL (ref 0.35–4.50)

## 2018-03-24 MED ORDER — FAMOTIDINE 20 MG PO TABS: ORAL_TABLET | ORAL | 11 refills | Status: DC

## 2018-03-24 MED ORDER — AMOXICILLIN-POT CLAVULANATE 875-125 MG PO TABS: 1.0000 | ORAL_TABLET | Freq: Two times a day (BID) | ORAL | 0 refills | Status: AC

## 2018-03-24 NOTE — Progress Notes (Signed)
Spoke with pt and notified of results per Dr. Wert. Pt verbalized understanding and denied any questions. 

## 2018-03-24 NOTE — Patient Instructions (Addendum)
For cough > mucinex dm up to 1200 mg every 12 hours as needed   Ear wax remover is over the counter   Add pepcid 20 mg after supper until next office visit  GERD (REFLUX)  is an extremely common cause of respiratory symptoms just like yours , many times with no obvious heartburn at all.    It can be treated with medication, but also with lifestyle changes including elevation of the head of your bed (ideally with 6 inch  bed blocks),  Smoking cessation, avoidance of late meals, excessive alcohol, and avoid fatty foods, chocolate, peppermint, colas, red wine, and acidic juices such as orange juice.  NO MINT OR MENTHOL PRODUCTS SO NO COUGH DROPS  USE SUGARLESS CANDY INSTEAD (Jolley ranchers or Stover's or Life Savers) or even ice chips will also do - the key is to swallow to prevent all throat clearing. NO OIL BASED VITAMINS - use powdered substitutes.    Augmentin 875 mg take one pill twice daily  X 10 days - take at breakfast and supper with large glass of water.  It would help reduce the usual side effects (diarrhea and yeast infections) if you ate cultured yogurt at lunch.   Please see patient coordinator before you leave today  to schedule sinus CT in 2 weeks no sooner  Please remember to go to the lab and x-ray department downstairs in the basement  for your tests - we will call you with the results when they are available.      Please schedule a follow up office visit in 6 weeks, call sooner if needed with all medications /inhalers/ solutions in hand so we can verify exactly what you are taking. This includes all medications from all doctors and over the counters  - PFTs on return  - Prevnar 13 on return

## 2018-03-24 NOTE — Progress Notes (Signed)
Subjective:    Patient ID: JOVEN MOM, male    DOB: 1960/11/17   MRN: [id number]  Brief patient profile:  61  yobm with morbid obesity actively smoking with documented sleep apnea for which he has seen Dr. Halford Chessman with no improvement in his weight over the despite previous extensive counseling regarding calorie balance issues     History of Present Illness   11/16/2017  Acute ov/Wert re:  DJD/ new acute cough x 10 days  Chief Complaint  Patient presents with  . Acute Visit    Appt scheduled to get refill on meloxicam for hip pain. He states he has had a cold for the past wk. He c/o runny nose, cough and HA. His cough is mainly non prod but he will occ produce some clear sputum. He has an albuterol inhaler but it's expired and he has not really felt like he needs it.   noct cough /wheeze  mucus clear since acute  onset of uri x one week with rhinorrhea, has used saba in past prn but rx ran out/ rarely needs despite ongoing smoking  rec For cough/ congestion > mucinex dm up to 1200 mg every 12 hours as needed  zpak  For wheeze/ short of breath >> albuterol inhaler up to 2 puff every 4 hours as needed  If not improving > Prednisone 10 mg take  4 each am x 2 days,   2 each am x 2 days,  1 each am x 2 days and stop    03/24/2018  f/u ov/Wert re:  Cpx/ cough never > 50% better, never filled rx for  albuterol /prednisone  Chief Complaint  Patient presents with  . Annual Exam    Pt is fasting. He is c/o cough and nasal congestion for the past wk. His cough is non prod. He has had some green nasal d/c.   Dyspnea:   Not limited by breathing from desired activities   Cough: worse immediately when   w/in 5 min rhs arely takes mucinex dm  Sleep: was ok until 2 weeks prior to OV   SABA use:   Never purchased   zpak rx by [facility] 03/18/18 some better  Nasal obst / congestion/ usually clear  X sev years  No obvious day to day or daytime variability or assoc excess/ purulent sputum or mucus plugs or  hemoptysis or cp or chest tightness, subjective wheeze or overt sinus or hb symptoms. No unusual exposure hx or h/o childhood pna/ asthma or knowledge of premature birth.    Also denies any obvious fluctuation of symptoms with weather or environmental changes or other aggravating or alleviating factors except as outlined above   Current Allergies, Complete Past Medical History, Past Surgical History, Family History, and Social History were reviewed in [facility] record.  ROS  The following are not active complaints unless bolded Hoarseness, sore throat, dysphagia, dental problems, itching, sneezing,  nasal congestion or discharge of excess mucus or purulent secretions, ear ache,   fever, chills, sweats, unintended wt loss or wt gain, classically pleuritic or exertional cp,  orthopnea pnd or arm/hand swelling  or leg swelling, presyncope, palpitations, abdominal pain, anorexia, nausea, vomiting, diarrhea  or change in bowel habits or change in bladder habits, change in stools or change in urine, dysuria, hematuria,  rash, arthralgias, visual complaints, headache, numbness, weakness or ataxia or problems with walking or coordination,  change in mood or  memory.  Current Meds  Medication Sig  . aspirin 81 MG tablet Take 81 mg by mouth daily.   . Cyanocobalamin (VITAMIN B 12) 250 MCG LOZG Take 1 tablet by mouth daily.  . meloxicam (MOBIC) 7.5 MG tablet Take 1 tablet (7.5 mg total) by mouth daily.  . [DISCONTINUED] benzonatate (TESSALON) 100 MG capsule Take 1 capsule (100 mg total) by mouth 2 (two) times daily as needed for cough.              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 external ear canals with wax impaction bilaterally  Modified Mallampati Score =   3/4/ moderate bilateral non-specific turbinate edema  With mp secretions L > R   NECK :  without JVD/Nodes/TM/ nl carotid upstrokes bilaterally   LUNGS: no acc muscle use,  Nl contour chest which is clear to A and P bilaterally without cough on insp or exp maneuvers   CV:  RRR  no s3 or murmur or increase in P2, and no edema   ABD:  Quite obese but soft and nontender with nl inspiratory excursion in the supine position. No bruits or organomegaly appreciated, bowel sounds nl  MS:  Nl gait/ ext warm without deformities, calf tenderness, cyanosis or clubbing No obvious joint restrictions   SKIN: warm and dry without lesions    NEURO:  alert, approp, nl sensorium with  no motor or cerebellar deficits apparent.    GU  Testes down bilaterally / uncirc / no IH/ nodules  Rectal  Mild bph / stool absent     Labs ordered/ reviewed:      Chemistry      Component Value Date/Time   NA 141 03/24/2018 0958   K 4.9 03/24/2018 0958   CL 102 03/24/2018 0958   CO2 32 03/24/2018 0958   BUN 11 03/24/2018 0958   CREATININE 0.90 03/24/2018 0958      Component Value Date/Time   CALCIUM 9.3 03/24/2018 0958   ALKPHOS 81 03/24/2018 0958   AST 10 03/24/2018 2683  ALT 13 03/24/2018 0958   BILITOT 0.3 03/24/2018 0958        Lab Results  Component Value Date   WBC 12.0 (H) 03/24/2018   HGB 14.6 03/24/2018   HCT 45.0 03/24/2018   MCV 84.4 03/24/2018   PLT 308.0 03/24/2018       EOS                                                               0.2                                    03/24/2018       Lab Results  Component Value Date   TSH 1.71 03/24/2018     Lab Results  Component Value Date   PROBNP 13.0 12/21/2007       Lab Results  Component Value Date   PSA 3.15 03/24/2018   PSA 3.09 09/02/2016   PSA 2.43 02/22/2014    Lab Results  Component Value Date   CHOL 147 03/24/2018   HDL 26.00 (L) 03/24/2018   LDLCALC 93 03/24/2018   LDLDIRECT 136.0 09/02/2016   TRIG 141.0 03/24/2018   CHOLHDL 6 03/24/2018                         Assessment & Plan:

## 2018-03-27 ENCOUNTER — Encounter: Payer: Self-pay | Admitting: Internal Medicine

## 2018-03-27 NOTE — Assessment & Plan Note (Signed)
Main issues are obesity/smoking > addressed

## 2018-03-27 NOTE — Assessment & Plan Note (Signed)
4-5 min discussion re active cigarette smoking in addition to office E&M  Ask about tobacco use:  active Advise quitting:  I reviewed the Fletcher curve with the patient that basically indicates  if you quit smoking when your best day FEV1 is still well preserved (as is probably  the case here pending f/u pfts )  it is highly unlikely you will progress to severe disease and informed the patient there was  no medication on the market that has proven to alter the curve/ its downward trajectory  or the likelihood of progression of their disease(unlike other chronic medical conditions such as atheroclerosis where we do think we can change the natural hx with risk reducing meds)    Therefore stopping smoking and maintaining abstinence are  the most important aspects of care, not choice of inhalers or for that matter, doctors.   Treatment other than smoking cessation  is entirely directed by severity of symptoms and focused also on reducing exacerbations, not attempting to change the natural history of the disease.   Assess willingness not there yet Assist in quit attempt when ready Arrange follow up. Address again when returns for pfts

## 2018-03-27 NOTE — Assessment & Plan Note (Signed)
-   Target LDL < 130  Male/ smoker/ fm hx  hdl is low but LDL at target > exercise/ eat more fish /

## 2018-03-27 NOTE — Assessment & Plan Note (Addendum)
Add pepcid 20 mg hs empirically, plus augmentin x 10 days then check sinus ct

## 2018-04-06 ENCOUNTER — Other Ambulatory Visit: Payer: BC Managed Care – PPO

## 2018-04-07 ENCOUNTER — Ambulatory Visit (INDEPENDENT_AMBULATORY_CARE_PROVIDER_SITE_OTHER)
Admission: RE | Admit: 2018-04-07 | Discharge: 2018-04-07 | Disposition: A | Discharge status: Home / Self Care | Payer: BC Managed Care – PPO | Source: Ambulatory Visit | Attending: Internal Medicine | Admitting: Internal Medicine

## 2018-04-07 DIAGNOSIS — R05 Cough: Secondary | ICD-10-CM | POA: Diagnosis not present

## 2018-04-07 DIAGNOSIS — R059 Cough, unspecified: Secondary | ICD-10-CM

## 2018-04-07 DIAGNOSIS — R058 Other specified cough: Secondary | ICD-10-CM

## 2018-05-05 ENCOUNTER — Other Ambulatory Visit: Payer: Self-pay | Admitting: Internal Medicine

## 2018-05-05 DIAGNOSIS — R05 Cough: Secondary | ICD-10-CM

## 2018-05-05 DIAGNOSIS — R059 Cough, unspecified: Secondary | ICD-10-CM

## 2018-05-06 ENCOUNTER — Ambulatory Visit (INDEPENDENT_AMBULATORY_CARE_PROVIDER_SITE_OTHER): Payer: BC Managed Care – PPO | Admitting: Internal Medicine

## 2018-05-06 ENCOUNTER — Encounter: Payer: Self-pay | Admitting: Internal Medicine

## 2018-05-06 VITALS — BP 116/74 | HR 78 | Ht 71.0 in | Wt 288.0 lb

## 2018-05-06 DIAGNOSIS — R059 Cough, unspecified: Secondary | ICD-10-CM

## 2018-05-06 DIAGNOSIS — F17210 Nicotine dependence, cigarettes, uncomplicated: Secondary | ICD-10-CM

## 2018-05-06 DIAGNOSIS — H6123 Impacted cerumen, bilateral: Secondary | ICD-10-CM

## 2018-05-06 DIAGNOSIS — R05 Cough: Secondary | ICD-10-CM | POA: Diagnosis not present

## 2018-05-06 LAB — PULMONARY FUNCTION TEST
DL/VA % PRED: 112 %
DL/VA: 5.25 ml/min/mmHg/L
DLCO cor % pred: 60 %
DLCO cor: 20.53 ml/min/mmHg
DLCO unc % pred: 60 %
DLCO unc: 20.53 ml/min/mmHg
FEF 25-75 Post: 2.33 L/sec
FEF 25-75 Pre: 1.66 L/sec
FEF2575-%CHANGE-POST: 39 %
FEF2575-%PRED-POST: 75 %
FEF2575-%Pred-Pre: 54 %
FEV1-%Change-Post: 5 %
FEV1-%Pred-Post: 60 %
FEV1-%Pred-Pre: 57 %
FEV1-PRE: 1.89 L
FEV1-Post: 1.99 L
FEV1FVC-%CHANGE-POST: 5 %
FEV1FVC-%Pred-Pre: 101 %
FEV6-%Change-Post: 0 %
FEV6-%PRED-PRE: 58 %
FEV6-%Pred-Post: 57 %
FEV6-PRE: 2.35 L
FEV6-Post: 2.34 L
FEV6FVC-%Change-Post: 0 %
FEV6FVC-%Pred-Post: 104 %
FEV6FVC-%Pred-Pre: 104 %
FVC-%CHANGE-POST: 0 %
FVC-%Pred-Post: 55 %
FVC-%Pred-Pre: 56 %
FVC-POST: 2.34 L
FVC-Pre: 2.35 L
POST FEV1/FVC RATIO: 85 %
PRE FEV6/FVC RATIO: 100 %
Post FEV6/FVC ratio: 100 %
Pre FEV1/FVC ratio: 80 %
RV % PRED: 81 %
RV: 1.84 L
TLC % pred: 57 %
TLC: 4.16 L

## 2018-05-06 NOTE — Progress Notes (Signed)
PFT done today. 

## 2018-05-06 NOTE — Progress Notes (Signed)
Subjective:    Patient ID: [name] [name], male    DOB: June 28, 1961   MRN: [id number]  Brief patient profile:  84  yobm with morbid obesity actively smoking with documented sleep apnea for which he has seen Dr. Halford [name] with no improvement in his weight over the despite previous extensive counseling regarding calorie balance issues     History of Present Illness   11/16/2017  Acute ov/[name] [name] re:  DJD/ new acute cough x 10 days  Chief Complaint  Patient presents with  . Acute Visit    Appt scheduled to get refill on meloxicam for hip pain. He states he has had a cold for the past wk. He c/o runny nose, cough and HA. His cough is mainly non prod but he will occ produce some clear sputum. He has an albuterol inhaler but it's expired and he has not really felt like he needs it.   noct cough /wheeze  mucus clear since acute  onset of uri x one week with rhinorrhea, has used saba in past prn but rx ran out/ rarely needs despite ongoing smoking  rec For cough/ congestion > mucinex dm up to 1200 mg every 12 hours as needed  zpak  For wheeze/ short of breath >> albuterol inhaler up to 2 puff every 4 hours as needed  If not improving > Prednisone 10 mg take  4 each am x 2 days,   2 each am x 2 days,  1 each am x 2 days and stop    03/24/2018  f/u ov/[name] [name] re:  Cpx/ cough never > 50% better, never filled rx for  albuterol /prednisone  Chief Complaint  Patient presents with  . Annual Exam    Pt is fasting. He is c/o cough and nasal congestion for the past wk. His cough is non prod. He has had some green nasal d/c.   Dyspnea:   Not limited by breathing from desired activities   Cough: worse immediately when   w/in 5 min rhs arely takes mucinex dm  Sleep: was ok until 2 weeks prior to OV   SABA use:   Never purchased   zpak rx by [facility] 03/18/18 some better  Nasal obst / congestion/ usually clear  X sev years rec For cough > mucinex dm up to 1200 mg every 12 hours as needed  Ear wax remover is over the  counter  Add pepcid 20 mg after supper until next office visit GERD . Augmentin 875 mg take one pill twice daily  X 10 days - take at breakfast and supper with large glass of water.  It would help reduce the usual side effects (diarrhea and yeast infections) if you ate cultured yogurt at lunch.  Please see patient coordinator before you leave today  to schedule sinus CT in 2 weeks no sooner Please remember to go to the lab and x-ray department downstairs in the basement  for your tests - we will call you with the results when they are available.   Please schedule a follow up office visit in 6 weeks, call sooner if needed with all medications /inhalers/ solutions in hand so we can verify exactly what you are taking. This includes all medications from all doctors and over the counters  - PFTs on return  - Prevnar 13 on return    05/06/2018  f/u ov/[name] [name] re: obesity / smoking  Chief Complaint  Patient presents with  . Follow-up    PFT's done today. Cough has  resolved.  Dyspnea:  Not limited by breathing from desired activities   Cough: resolved  Sleeping: ok flat  No    excess/ purulent sputum or mucus plugs or hemoptysis or cp or chest tightness, subjective wheeze or overt sinus or hb symptoms.     Also denies any obvious fluctuation of symptoms with weather or environmental changes or other aggravating or alleviating factors except as outlined above   No unusual exposure hx or h/o childhood pna/ asthma or knowledge of premature birth.  Current Allergies, Complete Past Medical History, Past Surgical History, Family History, and Social History were reviewed in [facility] record.  ROS  The following are not active complaints unless bolded Hoarseness, sore throat, dysphagia, dental problems, itching, sneezing,  nasal congestion or discharge of excess mucus or purulent secretions, ear ache,   fever, chills, sweats, unintended wt loss or wt gain, classically pleuritic or  exertional cp,  orthopnea pnd or arm/hand swelling  or leg swelling, presyncope, palpitations, abdominal pain, anorexia, nausea, vomiting, diarrhea  or change in bowel habits or change in bladder habits, change in stools or change in urine, dysuria, hematuria,  rash, arthralgias, visual complaints, headache, numbness, weakness or ataxia or problems with walking or coordination,  change in mood or  memory.        Current Meds  Medication Sig  . aspirin 81 MG tablet Take 81 mg by mouth daily.   . Cyanocobalamin (VITAMIN B 12) 250 MCG LOZG Take 1 tablet by mouth daily.               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 Ear wax bilaterally  Modified Mallampati Score =   3/4   NECK :  without JVD/Nodes/TM/ nl carotid upstrokes bilaterally   LUNGS: no acc muscle use,  Nl contour chest which is clear to A and P bilaterally without cough on insp or exp maneuvers   CV:  RRR  no s3 or murmur or increase in P2, and  no edema   ABD:  Obese soft and nontender with limited inspiratory excursion in the supine position. No bruits or organomegaly appreciated, bowel sounds nl  MS:  Nl gait/ ext warm without deformities, calf tenderness, cyanosis or clubbing No obvious joint restrictions   SKIN: warm and dry without lesions    NEURO:  alert, approp, nl sensorium with  no motor or cerebellar deficits apparent.                 Assessment & Plan:

## 2018-05-06 NOTE — Patient Instructions (Addendum)
Ear wax remover over the counter then see [name]  if not better   Prevnar 13 today  Although I don't endorse regular use of e cigs/ many pts find them helpful; however, I emphasized they should be considered a "one-way bridge" off all tobacco products.

## 2018-05-08 ENCOUNTER — Encounter: Payer: Self-pay | Admitting: Internal Medicine

## 2018-05-08 NOTE — Assessment & Plan Note (Signed)
rec otc kit and f/u NP prn for irrigation

## 2018-05-08 NOTE — Assessment & Plan Note (Addendum)
-   PFT's  05/06/2018  FEV1 1.99 (60 % ) ratio 85  p 5 % improvement from saba p nothing prior to study with DLCO  60/60c % corrects to 112 % for alv volume    4-5 min discussion re active cigarette smoking in addition to office E&M  Ask about tobacco use:   Ongoing as does his wife Advise quitting >  Discussed the risks and costs (both direct and indirect)  of smoking relative to the benefits of quitting but patient unwilling to commit at this point to a specific quit date.   Although I don't endorse regular use of e cigs/ many pts find them helpful; however, I emphasized they should be considered a "one-way bridge" off all tobacco products.  Assess willingness:  Not committed at this point/ lots of magical thinking related to smoking and risks of getting worse if he stops "I see that a lot" Assist in quit attempt:   when ready- try e cigs in interim but no point in pursuing if not committed and it would help if wife was also  Arrange follow up:   Yearly for cpx

## 2018-05-08 NOTE — Assessment & Plan Note (Addendum)
Body mass index is 40.17 kg/m.  -  trending up still  Lab Results  Component Value Date   TSH 1.71 03/24/2018     Contributing to gerd risk/ doe/reviewed the need and the process to achieve and maintain neg calorie balance    Referred to nutrition 2013 and needs to consider returning with his wife and food diary in hand.

## 2018-06-29 ENCOUNTER — Telehealth: Payer: Self-pay | Admitting: Internal Medicine

## 2018-06-29 NOTE — Telephone Encounter (Signed)
Attempted to call pt. I did not receive an answer. I have left a message for pt to return our call.  

## 2018-06-30 NOTE — Telephone Encounter (Signed)
Wife Silva Bandy [phone]) states patient would like to start using a CPAP machine; the machine he have is old, and he would like to get back on track; states patient talked with MW about a month ago about starting again; requesting to go forward with an order.

## 2018-06-30 NOTE — Telephone Encounter (Signed)
Spoke with patient-aware that he will need [facility] to get set up with CPAP again. New sleep study needed as well. Pt has been scheduled to see A.O. On Friday 07/15/18 at 9:30am.

## 2018-07-15 ENCOUNTER — Ambulatory Visit: Payer: BC Managed Care – PPO | Admitting: Pulmonary Disease

## 2018-07-15 ENCOUNTER — Encounter: Payer: Self-pay | Admitting: Pulmonary Disease

## 2018-07-15 VITALS — BP 132/80 | HR 77 | Ht 71.0 in | Wt 297.6 lb

## 2018-07-15 DIAGNOSIS — G4733 Obstructive sleep apnea (adult) (pediatric): Secondary | ICD-10-CM | POA: Diagnosis not present

## 2018-07-15 NOTE — Patient Instructions (Signed)
History of obstructive sleep apnea Worsening symptoms, increasing weight gain, nonrestorative sleep  Will order a split-night study  Encouraged about weight loss efforts  I will see you back in the office in about 6 to 8 weeks following initiation of treatment

## 2018-07-15 NOTE — Progress Notes (Signed)
[name] [name]    [id number]    [birth year]  Primary Care Physician:Wert, Christena Deem, MD  Referring Physician: Tanda Rockers, MD [address], [place] [address]  Chief complaint:   History of obstructive sleep apnea History of snoring, witnessed apneas Nonrestorative sleep  HPI: Diagnosed with obstructive sleep apnea in 2006 Used CPAP about 3 years-discontinued CPAP use secondary to feeling better generally Has noted lately that he does have significant nonrestorative sleep Significant snoring Spouse has noted worsening of his snoring He has continued to gain significant weight recently noted about 30 pounds Denies any significant health problems lately Usually goes to bed about 8-9PM Takes him about 5 to 10 minutes to fall asleep Will wake up in about 3 to 4 hours later, then wake up every 2 hours, take similar bedtime to fall back asleep Usually gets up about 5 AM Sleep has become less restorative Has no issues with concentration always memory   Outpatient Encounter Medications as of 07/15/2018  Medication Sig  . aspirin 81 MG tablet Take 81 mg by mouth daily.   . Cyanocobalamin (VITAMIN B 12) 250 MCG LOZG Take 1 tablet by mouth daily.  . famotidine (PEPCID) 20 MG tablet One at bedtime (Patient not taking: Reported on 05/06/2018)  . meloxicam (MOBIC) 7.5 MG tablet Take 1 tablet (7.5 mg total) by mouth daily. (Patient not taking: Reported on 05/06/2018)   No facility-administered encounter medications on file as of 07/15/2018.     Allergies as of 07/15/2018  . (No Known Allergies)    Past Medical History:  Diagnosis Date  . Arthritis   . Epididymitis   . Hyperlipidemia   . Morbid obesity ([place])   . OSA (obstructive sleep apnea)     Past Surgical History:  Procedure Laterality Date  . TESTICLE REMOVAL  age 62    Family History  Problem Relation Age of Onset  . Coronary artery disease Mother 3  . Diabetes Mother   . Heart disease Mother     . Hyperlipidemia Mother   . Diabetes Father   . Heart disease Father   . Diabetes Brother        older brother  . Cancer Neg Hx     Social History   Socioeconomic History  . Marital status: Married    Spouse name: Not on file  . Number of children: Not on file  . Years of education: Not on file  . Highest education level: Not on file  Occupational History  . Occupation: Retired    Fish farm manager: [employer]  Social Needs  . Financial resource strain: Not on file  . Food insecurity:    Worry: Not on file    Inability: Not on file  . Transportation needs:    Medical: Not on file    Non-medical: Not on file  Tobacco Use  . Smoking status: Current Every Day Smoker    Packs/day: 0.50    Years: 25.00    Pack years: 12.50    Types: Cigarettes  . Smokeless tobacco: Never Used  Substance and Sexual Activity  . Alcohol use: Yes    Alcohol/week: 3.0 - 4.0 standard drinks    Types: 3 - 4 Standard drinks or equivalent per week  . Drug use: No  . Sexual activity: Not on file  Lifestyle  . Physical activity:    Days per week: Not on file    Minutes per session: Not  on file  . Stress: Not on file  Relationships  . Social connections:    Talks on phone: Not on file    Gets together: Not on file    Attends religious service: Not on file    Active member of club or organization: Not on file    Attends meetings of clubs or organizations: Not on file    Relationship status: Not on file  . Intimate partner violence:    Fear of current or ex partner: Not on file    Emotionally abused: Not on file    Physically abused: Not on file    Forced sexual activity: Not on file  Other Topics Concern  . Not on file  Social History Narrative  . Not on file    Review of Systems  Constitutional: Positive for unexpected weight change.  Respiratory: Positive for apnea.   Psychiatric/Behavioral: Positive for sleep disturbance.  All other systems reviewed and are  negative.   Vitals:   07/15/18 0929  BP: 132/80  Pulse: 77  SpO2: 94%     Physical Exam  Constitutional: He is oriented to person, place, and time. He appears well-developed and well-nourished.  Obesity  HENT:  Head: Normocephalic and atraumatic.  Mallampati 3  Eyes: Pupils are equal, round, and reactive to light. Conjunctivae and EOM are normal. Right eye exhibits no discharge. Left eye exhibits no discharge.  Neck: Normal range of motion. Neck supple. No tracheal deviation present. No thyromegaly present.  Cardiovascular: Normal rate and regular rhythm.  Pulmonary/Chest: Effort normal and breath sounds normal. No respiratory distress. He has no wheezes. He has no rales.  Abdominal: Soft. Bowel sounds are normal. He exhibits no distension. There is no tenderness.  Musculoskeletal: Normal range of motion. He exhibits no edema.  Neurological: He is alert and oriented to person, place, and time. He has normal reflexes.  Skin: Skin is warm and dry.  Psychiatric: He has a normal mood and affect.   Data Reviewed: Records reviewed Dr. Gustavus Bryant notes reviewed  Assessment:  Obstructive sleep apnea  Morbid obesity  Nonrestorative sleep    Plan/Recommendations:  We will order a split niight sleep study  Encouraged about the need to lose weight and continue working on weight loss  Pathophysiology of sleep disordered breathing discussed  Treatment options of sleep disordered breathing discussed  Importance of addressing cardiovascular morbidity-treating his sleep disordered breathing and losing weight  I will see him back in the office in about 3 months   Sherrilyn Rist MD [facility] 07/15/2018, 9:35 AM  CC: Tanda Rockers, MD

## 2018-08-13 ENCOUNTER — Encounter (HOSPITAL_BASED_OUTPATIENT_CLINIC_OR_DEPARTMENT_OTHER): Payer: BC Managed Care – PPO

## 2018-08-14 ENCOUNTER — Emergency Department (HOSPITAL_COMMUNITY)
Admission: EM | Admit: 2018-08-14 | Discharge: 2018-08-14 | Disposition: A | Discharge status: Home / Self Care | Payer: BC Managed Care – PPO | Attending: Emergency Medicine | Admitting: Emergency Medicine

## 2018-08-14 ENCOUNTER — Encounter (HOSPITAL_COMMUNITY): Payer: Self-pay | Admitting: Emergency Medicine

## 2018-08-14 DIAGNOSIS — F17210 Nicotine dependence, cigarettes, uncomplicated: Secondary | ICD-10-CM | POA: Diagnosis not present

## 2018-08-14 DIAGNOSIS — R339 Retention of urine, unspecified: Secondary | ICD-10-CM | POA: Insufficient documentation

## 2018-08-14 DIAGNOSIS — Z79899 Other long term (current) drug therapy: Secondary | ICD-10-CM | POA: Insufficient documentation

## 2018-08-14 DIAGNOSIS — Z7982 Long term (current) use of aspirin: Secondary | ICD-10-CM | POA: Insufficient documentation

## 2018-08-14 LAB — URINALYSIS, ROUTINE W REFLEX MICROSCOPIC
BACTERIA UA: NONE SEEN
BILIRUBIN URINE: NEGATIVE
Glucose, UA: NEGATIVE mg/dL
KETONES UR: NEGATIVE mg/dL
NITRITE: NEGATIVE
PH: 6 (ref 5.0–8.0)
Protein, ur: NEGATIVE mg/dL
Specific Gravity, Urine: 1.015 (ref 1.005–1.030)

## 2018-08-14 NOTE — ED Provider Notes (Signed)
[facility] EMERGENCY DEPARTMENT Provider Note   CSN: [id number] Arrival date & time: 08/14/18  2107     History   Chief Complaint Chief Complaint  Patient presents with  . Urinary Retention    HPI [name] [name] is a 57 y.o. male.  Patient is a 57 year old male with no significant medical history presenting with complaints of urinary retention.  He reports that he has been unable to void all afternoon.  He does seem to have noticed a diminishing urine stream over the past week or 2.  He denies any dysuria, fevers, or chills.  He denies any abdominal or back pain.  He denies any leg numbness or weakness.  He denies any bowel issues.  The history is provided by the patient.    Past Medical History:  Diagnosis Date  . Arthritis   . Epididymitis   . Hyperlipidemia   . Morbid obesity ([place])   . OSA (obstructive sleep apnea)     Patient Active Problem List   Diagnosis Date Noted  . Upper airway cough syndrome 03/24/2018  . Healthcare maintenance 09/02/2016  . Skin tag 09/02/2016  . Hordeolum 08/15/2015  . Olecranon bursitis of right elbow 05/18/2014  . Other malaise and fatigue 02/22/2014  . Acute bronchitis 02/28/2013  . Multinodular goiter 09/21/2012  . Sciatica 09/06/2012  . Cough 06/06/2012  . Cigarette smoker 08/19/2010  . BENIGN PROSTATIC HYPERTROPHY, HX OF 08/19/2010  . HYPERCHOLESTEROLEMIA 04/25/2009  . Excessive ear wax, bilateral 12/22/2007  . Morbid obesity due to excess calories ([place]) complicated by osa 29/92/4268  . OBSTRUCTIVE SLEEP APNEA 09/06/2007    Past Surgical History:  Procedure Laterality Date  . TESTICLE REMOVAL  age 28        Home Medications    Prior to Admission medications   Medication Sig Start Date End Date Taking? Authorizing Provider  aspirin 81 MG tablet Take 81 mg by mouth daily.     [provider]  Cyanocobalamin (VITAMIN B 12) 250 MCG LOZG Take 1 tablet by mouth daily.    [provider]  famotidine (PEPCID) 20 MG tablet One at bedtime Patient not taking: Reported on 05/06/2018 03/24/18   Tanda Rockers, MD  meloxicam (MOBIC) 7.5 MG tablet Take 1 tablet (7.5 mg total) by mouth daily. Patient not taking: Reported on 05/06/2018 11/16/17   Tanda Rockers, MD    Family History Family History  Problem Relation Age of Onset  . Coronary artery disease Mother 32  . Diabetes Mother   . Heart disease Mother   . Hyperlipidemia Mother   . Diabetes Father   . Heart disease Father   . Diabetes Brother        older brother  . Cancer Neg Hx     Social History Social History   Tobacco Use  . Smoking status: Current Every Day Smoker    Packs/day: 0.50    Years: 25.00    Pack years: 12.50    Types: Cigarettes  . Smokeless tobacco: Never Used  Substance Use Topics  . Alcohol use: Yes    Alcohol/week: 3.0 - 4.0 standard drinks    Types: 3 - 4 Standard drinks or equivalent per week  . Drug use: No     Allergies   Patient has no known allergies.   Review of Systems Review of Systems  All other systems reviewed and are negative.    Physical Exam Updated Vital Signs BP (!) 142/75 (BP Location: Right Arm)  Pulse 97   Temp 99 F (37.2 C) (Oral)   Resp 20   SpO2 96%   Physical Exam  Constitutional: He is oriented to person, place, and time. He appears well-developed and well-nourished. No distress.  HENT:  Head: Normocephalic and atraumatic.  Neck: Normal range of motion. Neck supple.  Pulmonary/Chest: Effort normal.  Abdominal: Soft. Bowel sounds are normal. He exhibits no distension. There is no tenderness.  Musculoskeletal: Normal range of motion. He exhibits no edema.  There is no tenderness in the lumbar region.  Neurological: He is alert and oriented to person, place, and time. He displays normal reflexes. Coordination normal.  Drink is 5 out of 5 in both lower extremities.  DTRs are 1+ and symmetrical in both lower extremities.  Skin: Skin is warm and  dry. He is not diaphoretic.  Nursing note and vitals reviewed.    ED Treatments / Results  Labs (all labs ordered are listed, but only abnormal results are displayed) Labs Reviewed  URINALYSIS, ROUTINE W REFLEX MICROSCOPIC    EKG None  Radiology No results found.  Procedures Procedures (including critical care time)  Medications Ordered in ED Medications - No data to display   Initial Impression / Assessment and Plan / ED Course  I have reviewed the triage vital signs and the nursing notes.  Pertinent labs & imaging results that were available during my care of the patient were reviewed by me and considered in my medical decision making (see chart for details).  Foley catheter placed with several 100 cc of urine obtained and relief of symptoms.  Urinalysis shows no evidence for infection.  I suspect BPH as the cause.  He has no back pain, no bowel complaints, and strength and reflexes are symmetrical in both lower extremities.  He will be discharged with urology follow-up.  Catheter will be left in place and connected to a leg bag.  Final Clinical Impressions(s) / ED Diagnoses   Final diagnoses:  None    ED Discharge Orders    None       Veryl Speak, MD 08/14/18 2249

## 2018-08-14 NOTE — ED Notes (Signed)
Urine culture sent to main lab.

## 2018-08-14 NOTE — ED Notes (Signed)
Leg bag applied for pt and instructions on how to use

## 2018-08-14 NOTE — Discharge Instructions (Addendum)
Follow-up with urology in the next 1 to 2 days.  The contact information for [facility] has been provided in this discharge summary for you to call and make these arrangements.  Return to the emergency department if you experience any new and/or additional problems.

## 2018-08-14 NOTE — ED Triage Notes (Signed)
Pt reports he has had trouble urinating off/on X few days, normally has trouble initiating stream. Pt stats since last night he has been unable to void at all. Bladder Scanner shows 539ml.

## 2018-08-17 ENCOUNTER — Emergency Department (HOSPITAL_COMMUNITY)
Admission: EM | Admit: 2018-08-17 | Discharge: 2018-08-17 | Disposition: A | Discharge status: Home / Self Care | Payer: BC Managed Care – PPO | Attending: Emergency Medicine | Admitting: Emergency Medicine

## 2018-08-17 ENCOUNTER — Encounter (HOSPITAL_COMMUNITY): Payer: Self-pay | Admitting: Emergency Medicine

## 2018-08-17 ENCOUNTER — Other Ambulatory Visit: Payer: Self-pay

## 2018-08-17 DIAGNOSIS — Z79899 Other long term (current) drug therapy: Secondary | ICD-10-CM | POA: Diagnosis not present

## 2018-08-17 DIAGNOSIS — Z7982 Long term (current) use of aspirin: Secondary | ICD-10-CM | POA: Insufficient documentation

## 2018-08-17 DIAGNOSIS — F17210 Nicotine dependence, cigarettes, uncomplicated: Secondary | ICD-10-CM | POA: Diagnosis not present

## 2018-08-17 DIAGNOSIS — R339 Retention of urine, unspecified: Secondary | ICD-10-CM

## 2018-08-17 LAB — URINALYSIS, ROUTINE W REFLEX MICROSCOPIC
BACTERIA UA: NONE SEEN
Bilirubin Urine: NEGATIVE
Glucose, UA: NEGATIVE mg/dL
Ketones, ur: NEGATIVE mg/dL
Nitrite: NEGATIVE
PH: 7 (ref 5.0–8.0)
Protein, ur: NEGATIVE mg/dL
SPECIFIC GRAVITY, URINE: 1.009 (ref 1.005–1.030)

## 2018-08-17 MED ORDER — HYDROCODONE-ACETAMINOPHEN 5-325 MG PO TABS: 1.0000 | ORAL_TABLET | Freq: Four times a day (QID) | ORAL | 0 refills | Status: DC | PRN

## 2018-08-17 MED ORDER — DOCUSATE SODIUM 100 MG PO CAPS: 100.0000 mg | ORAL_CAPSULE | Freq: Two times a day (BID) | ORAL | 0 refills | Status: DC

## 2018-08-17 MED ORDER — OXYCODONE-ACETAMINOPHEN 5-325 MG PO TABS
1.0000 | ORAL_TABLET | Freq: Once | ORAL | Status: AC
  Administered 2018-08-17: 1 via ORAL
  Filled 2018-08-17: qty 1

## 2018-08-17 NOTE — Discharge Instructions (Signed)
°  Return to the emergency department for any abdominal pain, vomiting, numbness/weakness of your arms or legs, numbness in your groin or any other worsening or concerning symptoms.Continue taking the Flomax that you are prescribed.  Additionally, continue taking antibiotics.  Take stool softener to help with constipation.  Follow-up with Dr. Gilford Rile office.  Call his office tomorrow

## 2018-08-17 NOTE — ED Notes (Signed)
  Patient switched to leg bag for foley and ambulated in hallway.  Patient tolerated well.

## 2018-08-17 NOTE — ED Provider Notes (Signed)
[place] [facility] EMERGENCY DEPARTMENT Provider Note   CSN: [id number] Arrival date & time: 08/17/18  1919     History   Chief Complaint Chief Complaint  Patient presents with  . Urinary Retention    HPI [name] [name] is a 57 y.o. male past medical history of epididymitis, hyperlipidemia, obesity, OSA who presents for evaluation of urinary retention.  Patient reports that initially, he had urinary difficulties 4 days ago.  He was seen in the ED at the time and had a catheter inserted.  At that time, he was felt to be related to his prostate.  He followed up with urology this morning at 9 AM.  At that time, the Foley catheter was removed and he did a trial of void which was successful.  He was discharged home on antibiotics and Flomax which he states he has been taking.  Patient noticed that about 5:30 PM this evening, he had not urinated.  He states shortly thereafter, he started having some lower suprapubic pressure that radiated into his groin.  Felt like his lower stomach was distended because of the pressure.  Patient called urology who prompted him to come to the emergency department for further evaluation.  Additionally, patient reports that he has not had a bowel movement last 2 days.  He denies any new changes in medication and he is not currently on any narcotics.  Patient states that he had been having some difficulty urinating over the last several weeks.  He states that he would noticed that he would have difficulty starting his stream and that when he did, he would not void a lot.  Patient states he is not currently having any saddle anesthesia.  He denies any trauma, injury, fall.  Patient denies any back pain, saddle anesthesia ,numbness/weakness of his arms or legs, fever, difficulty in bleeding, chest pain, difficulty breathing, testicular pain or swelling, redness or swelling of his testicles.  The history is provided by the patient.    Past Medical History:    Diagnosis Date  . Arthritis   . Epididymitis   . Hyperlipidemia   . Morbid obesity ([place])   . OSA (obstructive sleep apnea)     Patient Active Problem List   Diagnosis Date Noted  . Upper airway cough syndrome 03/24/2018  . Healthcare maintenance 09/02/2016  . Skin tag 09/02/2016  . Hordeolum 08/15/2015  . Olecranon bursitis of right elbow 05/18/2014  . Other malaise and fatigue 02/22/2014  . Acute bronchitis 02/28/2013  . Multinodular goiter 09/21/2012  . Sciatica 09/06/2012  . Cough 06/06/2012  . Cigarette smoker 08/19/2010  . BENIGN PROSTATIC HYPERTROPHY, HX OF 08/19/2010  . HYPERCHOLESTEROLEMIA 04/25/2009  . Excessive ear wax, bilateral 12/22/2007  . Morbid obesity due to excess calories ([place]) complicated by osa 59/56/3875  . OBSTRUCTIVE SLEEP APNEA 09/06/2007    Past Surgical History:  Procedure Laterality Date  . TESTICLE REMOVAL  age 47        Home Medications    Prior to Admission medications   Medication Sig Start Date End Date Taking? Authorizing Provider  aspirin 81 MG tablet Take 81 mg by mouth daily.     [provider]  Cyanocobalamin (VITAMIN B 12) 250 MCG LOZG Take 1 tablet by mouth daily.    [provider]  docusate sodium (COLACE) 100 MG capsule Take 1 capsule (100 mg total) by mouth every 12 (twelve) hours. 08/17/18   Volanda Napoleon, PA-C  famotidine (PEPCID) 20 MG tablet One at  bedtime Patient not taking: Reported on 05/06/2018 03/24/18   Tanda Rockers, MD  HYDROcodone-acetaminophen (NORCO/VICODIN) 5-325 MG tablet Take 1-2 tablets by mouth every 6 (six) hours as needed. 08/17/18   Volanda Napoleon, PA-C  meloxicam (MOBIC) 7.5 MG tablet Take 1 tablet (7.5 mg total) by mouth daily. Patient not taking: Reported on 05/06/2018 11/16/17   Tanda Rockers, MD    Family History Family History  Problem Relation Age of Onset  . Coronary artery disease Mother 59  . Diabetes Mother   . Heart disease Mother   . Hyperlipidemia Mother    . Diabetes Father   . Heart disease Father   . Diabetes Brother        older brother  . Cancer Neg Hx     Social History Social History   Tobacco Use  . Smoking status: Current Every Day Smoker    Packs/day: 0.50    Years: 25.00    Pack years: 12.50    Types: Cigarettes  . Smokeless tobacco: Never Used  Substance Use Topics  . Alcohol use: Yes    Alcohol/week: 3.0 - 4.0 standard drinks    Types: 3 - 4 Standard drinks or equivalent per week  . Drug use: No     Allergies   Patient has no known allergies.   Review of Systems Review of Systems  Constitutional: Negative for fever.  Respiratory: Negative for cough and shortness of breath.   Cardiovascular: Negative for chest pain.  Gastrointestinal: Positive for abdominal pain and constipation. Negative for nausea and vomiting.  Genitourinary: Positive for decreased urine volume. Negative for dysuria, hematuria and scrotal swelling.  Musculoskeletal: Negative for back pain.  Neurological: Negative for weakness and numbness.  All other systems reviewed and are negative.    Physical Exam Updated Vital Signs BP 108/65   Pulse 68   Temp 99.1 F (37.3 C) (Oral)   Resp 18   SpO2 93%   Physical Exam  Constitutional: He is oriented to person, place, and time. He appears well-developed and well-nourished.  Sitting comfortably on examination table  HENT:  Head: Normocephalic and atraumatic.  Mouth/Throat: Oropharynx is clear and moist and mucous membranes are normal.  Eyes: Pupils are equal, round, and reactive to light. Conjunctivae, EOM and lids are normal.  Neck: Full passive range of motion without pain.  Cardiovascular: Normal rate, regular rhythm, normal heart sounds and normal pulses. Exam reveals no gallop and no friction rub.  No murmur heard. Pulmonary/Chest: Effort normal and breath sounds normal.  Abdominal: Soft. Normal appearance. There is no tenderness. There is no rigidity and no guarding. Hernia  confirmed negative in the right inguinal area and confirmed negative in the left inguinal area.  Abdomen is soft, non-distended, non-tender. No rigidity, No guarding. No peritoneal signs.  Genitourinary: Testes normal and penis normal. Rectal exam shows anal tone normal. Right testis shows no swelling and no tenderness. Left testis shows no swelling and no tenderness.  Genitourinary Comments: The exam was performed with a chaperone present. Digital Rectal Exam reveals sphincter with good tone. No external hemorrhoids. No masses or fissures. Stool color is brown with no overt blood.  Normal anal tone.  No tenderness palpation of bilateral testicles.  No overlying warmth, erythema, edema.  Musculoskeletal: Normal range of motion.       Thoracic back: He exhibits no tenderness.       Lumbar back: He exhibits no tenderness.  Neurological: He is alert and oriented to person, place,  and time.  Follows commands, Moves all extremities  5/5 strength to BUE and BLE  Sensation intact throughout all major nerve distributions Normal gait.   Skin: Skin is warm and dry. Capillary refill takes less than 2 seconds.  Psychiatric: He has a normal mood and affect. His speech is normal.  Nursing note and vitals reviewed.    ED Treatments / Results  Labs (all labs ordered are listed, but only abnormal results are displayed) Labs Reviewed  URINALYSIS, ROUTINE W REFLEX MICROSCOPIC - Abnormal; Notable for the following components:      Result Value   Hgb urine dipstick SMALL (*)    Leukocytes, UA TRACE (*)    All other components within normal limits  URINE CULTURE    EKG None  Radiology No results found.  Procedures Procedures (including critical care time)  Medications Ordered in ED Medications  oxyCODONE-acetaminophen (PERCOCET/ROXICET) 5-325 MG per tablet 1 tablet (1 tablet Oral Given 08/17/18 1931)     Initial Impression / Assessment and Plan / ED Course  I have reviewed the triage vital  signs and the nursing notes.  Pertinent labs & imaging results that were available during my care of the patient were reviewed by me and considered in my medical decision making (see chart for details).     57 year old male who presents for evaluation of urinary urinary retention.  Patient reports that this started about 4 days ago.  At that time, he was seen in the ED and had a Foley catheter inserted.  Reports improvement in symptoms.  Seen by urology today and had trial of void which was successful.  Reports that when he went home, he noticed that he had not urinated all day.  Also reported he has some suprapubic abdominal pressure, prompting ED visit.  He called urology and was told to go to the ED for further evaluation.  Reports some suprapubic abdominal pressure.  No tenderness palpation noted to the testicles, penis.  He states he has not had any recent fevers, nausea/vomiting, back pain.  Additionally he reports that he has not had a bowel movement last 2 days.  He has been eating and drinking without any difficulty.  He denies any vomiting.  He states that he is not on any new narcotic medication.  He has not had any numbness/weakness of his arms legs, saddle anesthesia, difficulty walking.  Suspects that this is likely related to prostate.  We will plan to insert Foley catheter.  We will plan to check urine.  Rectal exam as documented above.  Patient had normal anal tone.  He is moving all extremities without any difficulty he denies any saddle anesthesia.  Do not suspect cauda equina, acute spinal process at this time.  Patient feels better after Foley catheter inserted.  UA shows trace leukocytes.  He is already on antibiotics from the urologist.  I discussed results with both patient and wife.  They report that he consulted urology prior to coming to the ED.  They stated that if he was discharged home, to call their office tomorrow morning and they would arrange for an outpatient appointment.   They are comfortable with this.  Patient is ambulating the department any difficulty.  He is hemodynamically stable.  Repeat abdominal exam is benign.  He states he feels better after the Foley catheter.  He is stable for discharge.  He is requesting some pain medication for pain he says when he gets a Foley catheter inserted, causes some discomfort.  Patient reviewed on PMP with no recent narcotic prescriptions.  He is Artie on antibiotics and Flomax from urology.  Encouraged him to keep taking medications. Patient had ample opportunity for questions and discussion. All patient's questions were answered with full understanding. Strict return precautions discussed. Patient expresses understanding and agreement to plan.    Final Clinical Impressions(s) / ED Diagnoses   Final diagnoses:  Urinary retention    ED Discharge Orders         Ordered    HYDROcodone-acetaminophen (NORCO/VICODIN) 5-325 MG tablet  Every 6 hours PRN     08/17/18 2225    docusate sodium (COLACE) 100 MG capsule  Every 12 hours     08/17/18 2225           Volanda Napoleon, PA-C 08/18/18 0056    Lennice Sites, DO 08/18/18 2787

## 2018-08-17 NOTE — ED Triage Notes (Signed)
Pt presents to ED for assessment after having a urinary catheter removed this morning.  Patient states no urine output since removal, as well as constipation.  C/o 10/10 genitalia pain, pacing and diaphoretic in triage.

## 2018-08-19 LAB — URINE CULTURE: CULTURE: NO GROWTH

## 2018-09-14 ENCOUNTER — Encounter (HOSPITAL_BASED_OUTPATIENT_CLINIC_OR_DEPARTMENT_OTHER): Payer: BC Managed Care – PPO

## 2018-09-16 ENCOUNTER — Ambulatory Visit: Payer: BC Managed Care – PPO | Admitting: Pulmonary Disease

## 2018-10-25 ENCOUNTER — Encounter (HOSPITAL_BASED_OUTPATIENT_CLINIC_OR_DEPARTMENT_OTHER): Payer: BC Managed Care – PPO

## 2018-11-07 ENCOUNTER — Ambulatory Visit (INDEPENDENT_AMBULATORY_CARE_PROVIDER_SITE_OTHER): Payer: BC Managed Care – PPO

## 2018-11-07 ENCOUNTER — Encounter: Payer: Self-pay | Admitting: Family Medicine

## 2018-11-07 ENCOUNTER — Ambulatory Visit: Payer: BC Managed Care – PPO | Admitting: Family Medicine

## 2018-11-07 VITALS — BP 130/64 | HR 98 | Temp 99.2°F | Ht 71.0 in | Wt 284.6 lb

## 2018-11-07 DIAGNOSIS — M7502 Adhesive capsulitis of left shoulder: Secondary | ICD-10-CM

## 2018-11-07 NOTE — Patient Instructions (Signed)
Nice to meet you  Please try to focus on moving your shoulder as much as you can  Please see me back in 4-6 weeks  Happy new year.

## 2018-11-07 NOTE — Progress Notes (Signed)
[name] [name] - 57 y.o. male MRN [id number]  Date of birth: [birth year]  SUBJECTIVE:  Including CC & ROS.  Chief Complaint  Patient presents with  . Arm Pain    left arm     [name] [name] is a 57 y.o. male that is presenting with left shoulder pain.  This is been ongoing for a few weeks.  The pain is severe.  The pain is constant.  He is unable to lift his arm past 90 degrees in abduction.  He also has limited internal rotation.  Denies any trauma or inciting event.  This is localized to the shoulder.  Has not had any improvement with modalities to date.  No history of similar symptoms.   Review of Systems  Constitutional: Negative for fever.  HENT: Negative for congestion.   Respiratory: Negative for cough.   Cardiovascular: Negative for chest pain.  Gastrointestinal: Negative for abdominal distention.  Musculoskeletal: Negative for back pain.  Skin: Negative for color change.  Neurological: Negative for weakness.  Hematological: Negative for adenopathy.  Psychiatric/Behavioral: Negative for agitation.    HISTORY: Past Medical, Surgical, Social, and Family History Reviewed & Updated per EMR.   Pertinent Historical Findings include:  Past Medical History:  Diagnosis Date  . Arthritis   . Epididymitis   . Hyperlipidemia   . Morbid obesity ([place])   . OSA (obstructive sleep apnea)     Past Surgical History:  Procedure Laterality Date  . TESTICLE REMOVAL  age 40    No Known Allergies  Family History  Problem Relation Age of Onset  . Coronary artery disease Mother 65  . Diabetes Mother   . Heart disease Mother   . Hyperlipidemia Mother   . Diabetes Father   . Heart disease Father   . Diabetes Brother        older brother  . Cancer Neg Hx      Social History   Socioeconomic History  . Marital status: Married    Spouse name: Not on file  . Number of children: Not on file  . Years of education: Not on file  . Highest education level: Not on file    Occupational History  . Occupation: Retired    Fish farm manager: [employer]  Social Needs  . Financial resource strain: Not on file  . Food insecurity:    Worry: Not on file    Inability: Not on file  . Transportation needs:    Medical: Not on file    Non-medical: Not on file  Tobacco Use  . Smoking status: Current Every Day Smoker    Packs/day: 0.50    Years: 25.00    Pack years: 12.50    Types: Cigarettes  . Smokeless tobacco: Never Used  Substance and Sexual Activity  . Alcohol use: Yes    Alcohol/week: 3.0 - 4.0 standard drinks    Types: 3 - 4 Standard drinks or equivalent per week  . Drug use: No  . Sexual activity: Not on file  Lifestyle  . Physical activity:    Days per week: Not on file    Minutes per session: Not on file  . Stress: Not on file  Relationships  . Social connections:    Talks on phone: Not on file    Gets together: Not on file    Attends religious service: Not on file    Active member of club or organization: Not on file    Attends meetings of clubs or organizations: Not  on file    Relationship status: Not on file  . Intimate partner violence:    Fear of current or ex partner: Not on file    Emotionally abused: Not on file    Physically abused: Not on file    Forced sexual activity: Not on file  Other Topics Concern  . Not on file  Social History Narrative  . Not on file     PHYSICAL EXAM:  VS: BP 130/64   Pulse 98   Temp 99.2 F (37.3 C) (Oral)   Ht 5\' 11"  (1.803 m)   Wt 284 lb 9.6 oz (129.1 kg)   SpO2 93%   BMI 39.69 kg/m  Physical Exam Gen: NAD, alert, cooperative with exam, well-appearing ENT: normal lips, normal nasal mucosa,  Eye: normal EOM, normal conjunctiva and lids CV:  no edema, +2 pedal pulses   Resp: no accessory muscle use, non-labored,  Skin: no rashes, no areas of induration  Neuro: normal tone, normal sensation to touch Psych:  normal insight, alert and oriented MSK:  Left  shoulder: Shoulder: Inspection reveals no abnormalities, atrophy or asymmetry. Palpation is normal with no tenderness over [facility] joint  Limited ER  Limited abduction flexion to around 90 degrees. Pain with external rotation and abduction Rotator cuff strength normal throughout. negative Neer and Hawkin's tests, empty can sign. Neurovascularly intact    Aspiration/Injection Procedure Note [name] [name] 1961/08/09  Procedure: Injection Indications: Left shoulder pain  Procedure Details Consent: Risks of procedure as well as the alternatives and risks of each were explained to the (patient/caregiver).  Consent for procedure obtained. Time Out: Verified patient identification, verified procedure, site/side was marked, verified correct patient position, special equipment/implants available, medications/allergies/relevent history reviewed, required imaging and test results available.  Performed.  The area was cleaned with iodine and alcohol swabs.    The left glenohumeral joint was injected using 1 cc's of 40 mg Kenalog and 7 cc's of 0.25% bupivacaine with a 22 3 1/2" needle.  Ultrasound was used. Images were obtained in trans-views showing the injection.     A sterile dressing was applied.  Patient did tolerate procedure well.       ASSESSMENT & PLAN:   Adhesive capsulitis of left shoulder Symptoms suggestive of frozen shoulder with significant limitations with external rotation. -Glenohumeral injection. -Counseled on home exercise therapy and supportive care -If no improvement consider imaging or PT.

## 2018-11-10 DIAGNOSIS — M7502 Adhesive capsulitis of left shoulder: Secondary | ICD-10-CM | POA: Insufficient documentation

## 2018-11-10 NOTE — Assessment & Plan Note (Signed)
Symptoms suggestive of frozen shoulder with significant limitations with external rotation. -Glenohumeral injection. -Counseled on home exercise therapy and supportive care -If no improvement consider imaging or PT.

## 2018-12-04 ENCOUNTER — Other Ambulatory Visit: Payer: Self-pay

## 2018-12-04 ENCOUNTER — Encounter (HOSPITAL_COMMUNITY): Payer: Self-pay | Admitting: Emergency Medicine

## 2018-12-04 ENCOUNTER — Ambulatory Visit (HOSPITAL_COMMUNITY)
Admission: EM | Admit: 2018-12-04 | Discharge: 2018-12-04 | Disposition: A | Discharge status: Home / Self Care | Payer: BC Managed Care – PPO

## 2018-12-04 DIAGNOSIS — J22 Unspecified acute lower respiratory infection: Secondary | ICD-10-CM | POA: Diagnosis not present

## 2018-12-04 HISTORY — DX: Benign prostatic hyperplasia without lower urinary tract symptoms: N40.0

## 2018-12-04 MED ORDER — HYDROCODONE-HOMATROPINE 5-1.5 MG/5ML PO SYRP: 2.5000 mL | ORAL_SOLUTION | Freq: Four times a day (QID) | ORAL | 0 refills | Status: DC | PRN

## 2018-12-04 MED ORDER — AZITHROMYCIN 250 MG PO TABS: ORAL_TABLET | ORAL | 0 refills | Status: DC

## 2018-12-04 NOTE — ED Provider Notes (Signed)
12/04/2018 10:29 AM   DOB: 04-Sep-1961 / MRN: [id number]  SUBJECTIVE:  [name] [name] is a 58 y.o. male presenting for cold-like symptoms that started 4 days ago, however yesterday noted that he became worse and his cough became productive of green sputum.  He denies shortness of breath and chest pain.  Is been trying several over-the-counter medications which help.  He has No Known Allergies.   He  has a past medical history of Arthritis, BPH (benign prostatic hyperplasia), Epididymitis, Hyperlipidemia, Morbid obesity ([name] [name]), and OSA (obstructive sleep apnea).    He  reports that he has been smoking cigarettes. He has a 12.50 pack-year smoking history. He has never used smokeless tobacco. He reports current alcohol use of about 3.0 - 4.0 standard drinks of alcohol per week. He reports that he does not use drugs. He  has no history on file for sexual activity. The patient  has a past surgical history that includes Testicle removal (age 63).  His family history includes Coronary artery disease (age of onset: 53) in his mother; Diabetes in his brother, father, and mother; Heart disease in his father and mother; Hyperlipidemia in his mother.  Review of Systems  Constitutional: Negative for chills, diaphoresis and fever.  Respiratory: Positive for cough and sputum production. Negative for hemoptysis, shortness of breath and wheezing.   Cardiovascular: Negative for chest pain, orthopnea and leg swelling.  Gastrointestinal: Negative for nausea.  Skin: Negative for rash.  Neurological: Negative for dizziness.    OBJECTIVE:  BP 129/86 (BP Location: Left Arm)   Pulse 68   Temp 99.6 F (37.6 C) (Oral)   Resp 18   SpO2 98%   Wt Readings from Last 3 Encounters:  11/07/18 284 lb 9.6 oz (129.1 kg)  07/15/18 297 lb 9.6 oz (135 kg)  05/06/18 288 lb (130.6 kg)   Temp Readings from Last 3 Encounters:  12/04/18 99.6 F (37.6 C) (Oral)  11/07/18 99.2 F (37.3 C) (Oral)  08/17/18 99.1 F (37.3 C)  (Oral)   BP Readings from Last 3 Encounters:  12/04/18 129/86  11/07/18 130/64  08/17/18 108/65   Pulse Readings from Last 3 Encounters:  12/04/18 68  11/07/18 98  08/17/18 68    Physical Exam Vitals signs and nursing note reviewed.  Constitutional:      Appearance: He is well-developed. He is not ill-appearing or diaphoretic.  Eyes:     Conjunctiva/sclera: Conjunctivae normal.     Pupils: Pupils are equal, round, and reactive to light.  Cardiovascular:     Rate and Rhythm: Normal rate.  Pulmonary:     Effort: Pulmonary effort is normal. No respiratory distress.     Breath sounds: No stridor. Rhonchi present. No wheezing or rales.  Chest:     Chest wall: No tenderness.  Abdominal:     General: Abdomen is flat. There is no distension.  Musculoskeletal: Normal range of motion.  Skin:    General: Skin is warm and dry.  Neurological:     Mental Status: He is alert and oriented to person, place, and time.     Cranial Nerves: No cranial nerve deficit.     Coordination: Coordination normal.     No results found for this or any previous visit (from the past 72 hour(s)).  No results found.  ASSESSMENT AND PLAN:   Lower respiratory tract infection    Discharge Instructions     Please start the antibiotic today.  Please take the cough syrup as needed.  Okay to  continue taking ibuprofen, along with Tylenol and Mucinex.  Come back to the clinic if you are not feeling better in the next 4 to 5 days.  If you are getting worse please go to the ED.        The patient is advised to call or return to clinic if he does not see an improvement in symptoms, or to seek the care of the closest emergency department if he worsens with the above plan.   Philis Fendt, MHS, PA-C 12/04/2018 10:29 AM   Tereasa Coop, PA-C 12/04/18 1029

## 2018-12-04 NOTE — ED Triage Notes (Signed)
The patient presented to the [facility] with a complaint of a cough x 1 week that got worse yesterday.

## 2018-12-04 NOTE — Discharge Instructions (Signed)
Please start the antibiotic today.  Please take the cough syrup as needed.  Okay to continue taking ibuprofen, along with Tylenol and Mucinex.  Come back to the clinic if you are not feeling better in the next 4 to 5 days.  If you are getting worse please go to the ED.

## 2018-12-06 ENCOUNTER — Encounter (HOSPITAL_BASED_OUTPATIENT_CLINIC_OR_DEPARTMENT_OTHER): Payer: BC Managed Care – PPO

## 2018-12-06 ENCOUNTER — Ambulatory Visit: Payer: BC Managed Care – PPO | Admitting: Internal Medicine

## 2018-12-07 ENCOUNTER — Ambulatory Visit: Payer: BC Managed Care – PPO | Admitting: Internal Medicine

## 2018-12-07 ENCOUNTER — Other Ambulatory Visit: Payer: Self-pay

## 2018-12-07 ENCOUNTER — Inpatient Hospital Stay (HOSPITAL_COMMUNITY)
Admission: EM | Admit: 2018-12-07 | Discharge: 2018-12-12 | DRG: 193 | Disposition: A | Discharge status: Home / Self Care | Payer: BC Managed Care – PPO | Attending: Internal Medicine | Admitting: Internal Medicine

## 2018-12-07 ENCOUNTER — Emergency Department (HOSPITAL_COMMUNITY): Payer: BC Managed Care – PPO

## 2018-12-07 ENCOUNTER — Encounter: Payer: Self-pay | Admitting: Internal Medicine

## 2018-12-07 ENCOUNTER — Encounter (HOSPITAL_COMMUNITY): Payer: Self-pay | Admitting: Emergency Medicine

## 2018-12-07 VITALS — BP 92/60 | HR 84 | Temp 98.3°F | Ht 71.0 in | Wt 283.0 lb

## 2018-12-07 DIAGNOSIS — Z8349 Family history of other endocrine, nutritional and metabolic diseases: Secondary | ICD-10-CM | POA: Diagnosis not present

## 2018-12-07 DIAGNOSIS — J181 Lobar pneumonia, unspecified organism: Secondary | ICD-10-CM | POA: Diagnosis not present

## 2018-12-07 DIAGNOSIS — Z7982 Long term (current) use of aspirin: Secondary | ICD-10-CM

## 2018-12-07 DIAGNOSIS — Z716 Tobacco abuse counseling: Secondary | ICD-10-CM

## 2018-12-07 DIAGNOSIS — R8281 Pyuria: Secondary | ICD-10-CM | POA: Diagnosis present

## 2018-12-07 DIAGNOSIS — J4541 Moderate persistent asthma with (acute) exacerbation: Secondary | ICD-10-CM | POA: Diagnosis not present

## 2018-12-07 DIAGNOSIS — R338 Other retention of urine: Secondary | ICD-10-CM | POA: Diagnosis present

## 2018-12-07 DIAGNOSIS — N401 Enlarged prostate with lower urinary tract symptoms: Secondary | ICD-10-CM | POA: Diagnosis present

## 2018-12-07 DIAGNOSIS — J9601 Acute respiratory failure with hypoxia: Secondary | ICD-10-CM | POA: Diagnosis present

## 2018-12-07 DIAGNOSIS — J129 Viral pneumonia, unspecified: Secondary | ICD-10-CM | POA: Diagnosis present

## 2018-12-07 DIAGNOSIS — F17210 Nicotine dependence, cigarettes, uncomplicated: Secondary | ICD-10-CM | POA: Diagnosis present

## 2018-12-07 DIAGNOSIS — R778 Other specified abnormalities of plasma proteins: Secondary | ICD-10-CM | POA: Diagnosis present

## 2018-12-07 DIAGNOSIS — R9431 Abnormal electrocardiogram [ECG] [EKG]: Secondary | ICD-10-CM | POA: Diagnosis not present

## 2018-12-07 DIAGNOSIS — J45901 Unspecified asthma with (acute) exacerbation: Secondary | ICD-10-CM | POA: Diagnosis present

## 2018-12-07 DIAGNOSIS — Z720 Tobacco use: Secondary | ICD-10-CM | POA: Diagnosis present

## 2018-12-07 DIAGNOSIS — J101 Influenza due to other identified influenza virus with other respiratory manifestations: Secondary | ICD-10-CM

## 2018-12-07 DIAGNOSIS — Z6839 Body mass index (BMI) 39.0-39.9, adult: Secondary | ICD-10-CM

## 2018-12-07 DIAGNOSIS — Z791 Long term (current) use of non-steroidal anti-inflammatories (NSAID): Secondary | ICD-10-CM

## 2018-12-07 DIAGNOSIS — J440 Chronic obstructive pulmonary disease with acute lower respiratory infection: Secondary | ICD-10-CM | POA: Diagnosis present

## 2018-12-07 DIAGNOSIS — E785 Hyperlipidemia, unspecified: Secondary | ICD-10-CM | POA: Diagnosis present

## 2018-12-07 DIAGNOSIS — N390 Urinary tract infection, site not specified: Secondary | ICD-10-CM

## 2018-12-07 DIAGNOSIS — Z833 Family history of diabetes mellitus: Secondary | ICD-10-CM

## 2018-12-07 DIAGNOSIS — Z8249 Family history of ischemic heart disease and other diseases of the circulatory system: Secondary | ICD-10-CM

## 2018-12-07 DIAGNOSIS — J209 Acute bronchitis, unspecified: Secondary | ICD-10-CM | POA: Diagnosis not present

## 2018-12-07 DIAGNOSIS — Z9079 Acquired absence of other genital organ(s): Secondary | ICD-10-CM | POA: Diagnosis not present

## 2018-12-07 DIAGNOSIS — R7989 Other specified abnormal findings of blood chemistry: Secondary | ICD-10-CM

## 2018-12-07 DIAGNOSIS — J1008 Influenza due to other identified influenza virus with other specified pneumonia: Secondary | ICD-10-CM | POA: Diagnosis not present

## 2018-12-07 DIAGNOSIS — Z79899 Other long term (current) drug therapy: Secondary | ICD-10-CM | POA: Diagnosis not present

## 2018-12-07 DIAGNOSIS — I248 Other forms of acute ischemic heart disease: Secondary | ICD-10-CM | POA: Diagnosis present

## 2018-12-07 DIAGNOSIS — R0902 Hypoxemia: Secondary | ICD-10-CM | POA: Diagnosis not present

## 2018-12-07 DIAGNOSIS — E662 Morbid (severe) obesity with alveolar hypoventilation: Secondary | ICD-10-CM | POA: Diagnosis present

## 2018-12-07 DIAGNOSIS — J189 Pneumonia, unspecified organism: Secondary | ICD-10-CM

## 2018-12-07 DIAGNOSIS — R0602 Shortness of breath: Secondary | ICD-10-CM | POA: Diagnosis not present

## 2018-12-07 HISTORY — DX: Cough: R05

## 2018-12-07 HISTORY — DX: Other specified cough: R05.8

## 2018-12-07 LAB — LIPASE, BLOOD: Lipase: 32 U/L (ref 11–51)

## 2018-12-07 LAB — CBC WITH DIFFERENTIAL/PLATELET
Abs Immature Granulocytes: 0.07 10*3/uL (ref 0.00–0.07)
Basophils Absolute: 0 10*3/uL (ref 0.0–0.1)
Basophils Relative: 0 %
EOS PCT: 0 %
Eosinophils Absolute: 0 10*3/uL (ref 0.0–0.5)
HCT: 46.9 % (ref 39.0–52.0)
Hemoglobin: 14 g/dL (ref 13.0–17.0)
Immature Granulocytes: 1 %
LYMPHS PCT: 13 %
Lymphs Abs: 1.3 10*3/uL (ref 0.7–4.0)
MCH: 27 pg (ref 26.0–34.0)
MCHC: 29.9 g/dL — ABNORMAL LOW (ref 30.0–36.0)
MCV: 90.4 fL (ref 80.0–100.0)
Monocytes Absolute: 1.1 10*3/uL — ABNORMAL HIGH (ref 0.1–1.0)
Monocytes Relative: 12 %
Neutro Abs: 7 10*3/uL (ref 1.7–7.7)
Neutrophils Relative %: 74 %
Platelets: 222 10*3/uL (ref 150–400)
RBC: 5.19 MIL/uL (ref 4.22–5.81)
RDW: 16.7 % — ABNORMAL HIGH (ref 11.5–15.5)
WBC: 9.5 10*3/uL (ref 4.0–10.5)
nRBC: 0 % (ref 0.0–0.2)

## 2018-12-07 LAB — INFLUENZA PANEL BY PCR (TYPE A & B)
INFLAPCR: POSITIVE — AB
INFLBPCR: NEGATIVE

## 2018-12-07 LAB — URINALYSIS, ROUTINE W REFLEX MICROSCOPIC
Bilirubin Urine: NEGATIVE
Glucose, UA: NEGATIVE mg/dL
Ketones, ur: 5 mg/dL — AB
Nitrite: NEGATIVE
Protein, ur: 100 mg/dL — AB
SPECIFIC GRAVITY, URINE: 1.027 (ref 1.005–1.030)
pH: 5 (ref 5.0–8.0)

## 2018-12-07 LAB — TROPONIN I
Troponin I: 0.18 ng/mL (ref <0.03)
Troponin I: 0.13 ng/mL (ref <0.03)

## 2018-12-07 LAB — COMPREHENSIVE METABOLIC PANEL
ALT: 28 U/L (ref 0–44)
ANION GAP: 8 (ref 5–15)
AST: 35 U/L (ref 15–41)
Albumin: 3.6 g/dL (ref 3.5–5.0)
Alkaline Phosphatase: 70 U/L (ref 38–126)
BUN: 21 mg/dL — ABNORMAL HIGH (ref 6–20)
CO2: 31 mmol/L (ref 22–32)
Calcium: 8.6 mg/dL — ABNORMAL LOW (ref 8.9–10.3)
Chloride: 97 mmol/L — ABNORMAL LOW (ref 98–111)
Creatinine, Ser: 1.04 mg/dL (ref 0.61–1.24)
GFR calc non Af Amer: >60 mL/min (ref >60)
Glucose, Bld: 121 mg/dL — ABNORMAL HIGH (ref 70–99)
Potassium: 4.1 mmol/L (ref 3.5–5.1)
Sodium: 136 mmol/L (ref 135–145)
Total Bilirubin: 0.3 mg/dL (ref 0.3–1.2)
Total Protein: 7.5 g/dL (ref 6.5–8.1)

## 2018-12-07 LAB — LACTIC ACID, PLASMA
LACTIC ACID, VENOUS: 0.9 mmol/L (ref 0.5–1.9)
Lactic Acid, Venous: 1.1 mmol/L (ref 0.5–1.9)

## 2018-12-07 LAB — BRAIN NATRIURETIC PEPTIDE: B Natriuretic Peptide: 199.6 pg/mL — ABNORMAL HIGH (ref 0.0–100.0)

## 2018-12-07 MED ORDER — IPRATROPIUM-ALBUTEROL 0.5-2.5 (3) MG/3ML IN SOLN
3.0000 mL | Freq: Once | RESPIRATORY_TRACT | Status: AC
  Administered 2018-12-07: 3 mL via RESPIRATORY_TRACT
  Filled 2018-12-07: qty 3

## 2018-12-07 MED ORDER — ONDANSETRON HCL 4 MG/2ML IJ SOLN
4.0000 mg | INTRAMUSCULAR | Status: DC | PRN
  Administered 2018-12-07: 4 mg via INTRAVENOUS
  Filled 2018-12-07 (×3): qty 2

## 2018-12-07 MED ORDER — ALBUTEROL SULFATE (2.5 MG/3ML) 0.083% IN NEBU
2.5000 mg | INHALATION_SOLUTION | Freq: Once | RESPIRATORY_TRACT | Status: AC
  Administered 2018-12-07: 2.5 mg via RESPIRATORY_TRACT
  Filled 2018-12-07: qty 3

## 2018-12-07 MED ORDER — OSELTAMIVIR PHOSPHATE 75 MG PO CAPS
75.0000 mg | ORAL_CAPSULE | Freq: Two times a day (BID) | ORAL | Status: AC
  Administered 2018-12-07 – 2018-12-12 (×10): 75 mg via ORAL
  Filled 2018-12-07 (×12): qty 1

## 2018-12-07 MED ORDER — DOXYCYCLINE HYCLATE 100 MG PO TABS
100.0000 mg | ORAL_TABLET | Freq: Two times a day (BID) | ORAL | Status: DC
  Administered 2018-12-07 – 2018-12-12 (×10): 100 mg via ORAL
  Filled 2018-12-07 (×11): qty 1

## 2018-12-07 MED ORDER — ALBUTEROL SULFATE (2.5 MG/3ML) 0.083% IN NEBU: 5.0000 mg | INHALATION_SOLUTION | Freq: Once | RESPIRATORY_TRACT | Status: DC

## 2018-12-07 MED ORDER — SODIUM CHLORIDE 0.9 % IV SOLN
INTRAVENOUS | Status: DC
  Administered 2018-12-07 – 2018-12-11 (×5): via INTRAVENOUS

## 2018-12-07 MED ORDER — SODIUM CHLORIDE 0.9 % IV SOLN
1.0000 g | INTRAVENOUS | Status: DC
  Administered 2018-12-07 – 2018-12-12 (×6): 1 g via INTRAVENOUS
  Filled 2018-12-07: qty 10
  Filled 2018-12-07 (×5): qty 1

## 2018-12-07 NOTE — ED Notes (Signed)
Bed: PN36 Expected date:  Expected time:  Means of arrival:  Comments: EMS/hypoxemia/fever

## 2018-12-07 NOTE — Assessment & Plan Note (Addendum)
Assoc with n and v ? From zmax and low 02 sats in smoker  With  low BP and no flu vaccination   rec Transport to [facility] directly/ Tim triage nurse aware to place on 02 on arrival and direct w/u for possible pna/sepsis  Discussed limited options for outpt w/u with pt/ wife > agree to above plan / decline ambulance transport and frankly don't understand why w/u can't be done here and treated as outpt but assured them this was the best option.

## 2018-12-07 NOTE — ED Provider Notes (Signed)
[place] DEPT Provider Note   CSN: [id number] Arrival date & time: 12/07/18  1550     History   Chief Complaint Chief Complaint  Patient presents with  . Shortness of Breath    HPI [name] [name] is a 58 y.o. male.  HPI  Pt was seen at 1615. Per pt and his wife, c/o gradual onset and worsening of persistent cough, wheezing, SOB for the past 1 week. Has been associated with runny/stuffy nose, home temps to "98." Pt was evaluated at [facility] 3 days ago, dx pneumonia, rx zithromax and narcotic cough syrup. Pt states the cough syrup "makes him loopy" and the antibiotic was followed by N/V today. Pt was evaluated by his Pulm MD today, was told his "O2 Sats were in the 70's" and "BP was in the 90's." Pt's wife states they were told to come to the ED for further evaluation. Pt also states he has not received a flu shot this year.  Has been exposed to multiple sick contacts with similar symptoms. Denies objective fevers, no CP/palpitations, no back pain, no abd pain, no diarrhea, no black or blood in stools or emesis.    Past Medical History:  Diagnosis Date  . Arthritis   . BPH (benign prostatic hyperplasia)   . Epididymitis   . Hyperlipidemia   . Morbid obesity ([place])   . OSA (obstructive sleep apnea)   . Upper airway cough syndrome     Patient Active Problem List   Diagnosis Date Noted  . 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  . Upper airway cough syndrome 03/24/2018  . Healthcare maintenance 09/02/2016  . Skin tag 09/02/2016  . Hordeolum 08/15/2015  . Olecranon bursitis of right elbow 05/18/2014  . Other malaise and fatigue 02/22/2014  . Acute bronchitis 02/28/2013  . Multinodular goiter 09/21/2012  . Sciatica 09/06/2012  . Cough 06/06/2012  . Cigarette smoker 08/19/2010  . BENIGN PROSTATIC HYPERTROPHY, HX OF 08/19/2010  . HYPERCHOLESTEROLEMIA 04/25/2009  . Excessive ear wax, bilateral 12/22/2007  . Morbid obesity due to excess  calories ([place]) complicated by osa 14/43/1540  . OBSTRUCTIVE SLEEP APNEA 09/06/2007    Past Surgical History:  Procedure Laterality Date  . TESTICLE REMOVAL  age 35        Home Medications    Prior to Admission medications   Medication Sig Start Date End Date Taking? Authorizing Provider  aspirin 81 MG tablet Take 81 mg by mouth daily.     [provider]  HYDROcodone-homatropine (HYCODAN) 5-1.5 MG/5ML syrup Take 2.5-5 mLs by mouth every 6 (six) hours as needed for up to 5 days for cough. 12/04/18 12/09/18  Tereasa Coop, PA-C  meloxicam (MOBIC) 7.5 MG tablet Take 1 tablet (7.5 mg total) by mouth daily. 11/16/17   Tanda Rockers, MD  tamsulosin (FLOMAX) 0.4 MG CAPS capsule Take 0.4 mg by mouth.    [provider]    Family History Family History  Problem Relation Age of Onset  . Coronary artery disease Mother 56  . Diabetes Mother   . Heart disease Mother   . Hyperlipidemia Mother   . Diabetes Father   . Heart disease Father   . Diabetes Brother        older brother  . Cancer Neg Hx     Social History Social History   Tobacco Use  . Smoking status: Current Every Day Smoker    Packs/day: 0.50    Years: 25.00    Pack years: 12.50  Types: Cigarettes  . Smokeless tobacco: Never Used  Substance Use Topics  . Alcohol use: Yes    Alcohol/week: 3.0 - 4.0 standard drinks    Types: 3 - 4 Standard drinks or equivalent per week  . Drug use: No     Allergies   Patient has no known allergies.   Review of Systems Review of Systems ROS: Statement: All systems negative except as marked or noted in the HPI; Constitutional: Negative for objective fever and chills. ; ; Eyes: Negative for eye pain, redness and discharge. ; ; ENMT: Negative for ear pain, hoarseness, sore throat. +runny nose, nasal congestion, sinus pressure. ; ; Cardiovascular: Negative for chest pain, palpitations, diaphoresis, and peripheral edema. ; ; Respiratory: +SOB, cough, wheezing.  Negative for stridor. ; ; Gastrointestinal: Negative for nausea, vomiting, diarrhea, abdominal pain, blood in stool, hematemesis, jaundice and rectal bleeding. . ; ; Genitourinary: Negative for dysuria, flank pain and hematuria. ; ; Musculoskeletal: Negative for back pain and neck pain. Negative for swelling and trauma.; ; Skin: Negative for pruritus, rash, abrasions, blisters, bruising and skin lesion.; ; Neuro: Negative for headache, lightheadedness and neck stiffness. Negative for weakness, altered level of consciousness, altered mental status, extremity weakness, paresthesias, involuntary movement, seizure and syncope.       Physical Exam Updated Vital Signs BP 111/76 (BP Location: Right Arm)   Pulse 90   Temp 98.6 F (37 C) (Oral)   Resp (!) 22   Ht 5\' 11"  (1.803 m)   Wt 127.9 kg   SpO2 95%   BMI 39.33 kg/m    Patient Vitals for the past 24 hrs:  BP Temp Temp src Pulse Resp SpO2 Height Weight  12/07/18 1752 114/70 - - 85 12 93 % - -  12/07/18 1751 114/70 - - 83 - (!) 12 % - -  12/07/18 1603 111/76 98.6 F (37 C) Oral 90 (!) 22 95 % 5\' 11"  (1.803 m) 127.9 kg     Physical Exam 1620: Physical examination:  Nursing notes reviewed; Vital signs and O2 SAT reviewed;  Constitutional: Well developed, Well nourished, Well hydrated, In no acute distress; Head:  Normocephalic, atraumatic; Eyes: EOMI, PERRL, No scleral icterus; ENMT: Mouth and pharynx normal, Mucous membranes moist; Neck: Supple, Full range of motion, No lymphadenopathy; Cardiovascular: Regular rate and rhythm, No gallop; Respiratory: Breath sounds coarse & equal bilaterally, scattered wheezes bilat. No audible wheezing. Mild tachypnea. Speaking full sentences with ease, Normal respiratory effort/excursion; Chest: Nontender, Movement normal; Abdomen: Soft, Nontender, Nondistended, Normal bowel sounds; Genitourinary: No CVA tenderness; Extremities: Peripheral pulses normal, No tenderness, +1 pedal edema bilat. No calf edema or  asymmetry.; Neuro: AA&Ox3, Major CN grossly intact.  Speech clear. No gross focal motor or sensory deficits in extremities.; Skin: Color normal, Warm, Dry.    ED Treatments / Results  Labs (all labs ordered are listed, but only abnormal results are displayed)   EKG EKG Interpretation  Date/Time:  Wednesday December 07 2018 17:34:55 EST Ventricular Rate:  82 PR Interval:    QRS Duration: 103 QT Interval:  362 QTC Calculation: 423 R Axis:   -113 Text Interpretation:  Sinus rhythm Probable right ventricular hypertrophy Nonspecific ST and T wave abnormality When compared with ECG of 01/11/2018 No significant change was found Confirmed by Francine Graven [phone]) on 12/07/2018 5:40:24 PM    EKG Interpretation  Date/Time:  Wednesday December 07 2018 18:31:24 EST Ventricular Rate:  86 PR Interval:    QRS Duration: 103 QT Interval:  362 QTC  Calculation: 433 R Axis:   -91 Text Interpretation:  Sinus rhythm Probable right ventricular hypertrophy Since last tracing of earlier today No significant change was found Confirmed by Francine Graven [phone]) on 12/07/2018 6:35:19 PM        Radiology   Procedures Procedures (including critical care time)  Medications Ordered in ED Medications  ipratropium-albuterol (DUONEB) 0.5-2.5 (3) MG/3ML nebulizer solution 3 mL (has no administration in time range)  albuterol (PROVENTIL) (2.5 MG/3ML) 0.083% nebulizer solution 2.5 mg (has no administration in time range)     Initial Impression / Assessment and Plan / ED Course  I have reviewed the triage vital signs and the nursing notes.  Pertinent labs & imaging results that were available during my care of the patient were reviewed by me and considered in my medical decision making (see chart for details).  MDM Reviewed: previous chart, nursing note and vitals Reviewed previous: labs and ECG Interpretation: labs, ECG and x-ray   Results for orders placed or performed during the hospital  encounter of 12/07/18  Urinalysis, Routine w reflex microscopic  Result Value Ref Range   Color, Urine AMBER (A) YELLOW   APPearance HAZY (A) CLEAR   Specific Gravity, Urine 1.027 1.005 - 1.030   pH 5.0 5.0 - 8.0   Glucose, UA NEGATIVE NEGATIVE mg/dL   Hgb urine dipstick MODERATE (A) NEGATIVE   Bilirubin Urine NEGATIVE NEGATIVE   Ketones, ur 5 (A) NEGATIVE mg/dL   Protein, ur 100 (A) NEGATIVE mg/dL   Nitrite NEGATIVE NEGATIVE   Leukocytes, UA SMALL (A) NEGATIVE   RBC / HPF >50 (H) 0 - 5 RBC/hpf   WBC, UA 21-50 0 - 5 WBC/hpf   Bacteria, UA MANY (A) NONE SEEN   Squamous Epithelial / LPF 0-5 0 - 5   Mucus PRESENT   Influenza panel by PCR (type A & B)  Result Value Ref Range   Influenza A By PCR POSITIVE (A) NEGATIVE   Influenza B By PCR NEGATIVE NEGATIVE  Brain natriuretic peptide  Result Value Ref Range   B Natriuretic Peptide 199.6 (H) 0.0 - 100.0 pg/mL  CBC with Differential  Result Value Ref Range   WBC 9.5 4.0 - 10.5 K/uL   RBC 5.19 4.22 - 5.81 MIL/uL   Hemoglobin 14.0 13.0 - 17.0 g/dL   HCT 46.9 39.0 - 52.0 %   MCV 90.4 80.0 - 100.0 fL   MCH 27.0 26.0 - 34.0 pg   MCHC 29.9 (L) 30.0 - 36.0 g/dL   RDW 16.7 (H) 11.5 - 15.5 %   Platelets 222 150 - 400 K/uL   nRBC 0.0 0.0 - 0.2 %   Neutrophils Relative % 74 %   Neutro Abs 7.0 1.7 - 7.7 K/uL   Lymphocytes Relative 13 %   Lymphs Abs 1.3 0.7 - 4.0 K/uL   Monocytes Relative 12 %   Monocytes Absolute 1.1 (H) 0.1 - 1.0 K/uL   Eosinophils Relative 0 %   Eosinophils Absolute 0.0 0.0 - 0.5 K/uL   Basophils Relative 0 %   Basophils Absolute 0.0 0.0 - 0.1 K/uL   Immature Granulocytes 1 %   Abs Immature Granulocytes 0.07 0.00 - 0.07 K/uL  Comprehensive metabolic panel  Result Value Ref Range   Sodium 136 135 - 145 mmol/L   Potassium 4.1 3.5 - 5.1 mmol/L   Chloride 97 (L) 98 - 111 mmol/L   CO2 31 22 - 32 mmol/L   Glucose, Bld 121 (H) 70 - 99 mg/dL  BUN 21 (H) 6 - 20 mg/dL   Creatinine, Ser 1.04 0.61 - 1.24 mg/dL   Calcium  8.6 (L) 8.9 - 10.3 mg/dL   Total Protein 7.5 6.5 - 8.1 g/dL   Albumin 3.6 3.5 - 5.0 g/dL   AST 35 15 - 41 U/L   ALT 28 0 - 44 U/L   Alkaline Phosphatase 70 38 - 126 U/L   Total Bilirubin 0.3 0.3 - 1.2 mg/dL   GFR calc non Af Amer >60 >60 mL/min   GFR calc Af Amer >60 >60 mL/min   Anion gap 8 5 - 15  Lipase, blood  Result Value Ref Range   Lipase 32 11 - 51 U/L  Troponin I - Add-On to previous collection  Result Value Ref Range   Troponin I 0.18 (HH) <0.03 ng/mL  Lactic acid, plasma  Result Value Ref Range   Lactic Acid, Venous 0.9 0.5 - 1.9 mmol/L  Lactic acid, plasma  Result Value Ref Range   Lactic Acid, Venous 1.1 0.5 - 1.9 mmol/L  Troponin I - Once  Result Value Ref Range   Troponin I 0.13 (HH) <0.03 ng/mL   Dg Chest 2 View Result Date: 12/07/2018 CLINICAL DATA:  Cough, shortness of breath, and wheezing. EXAM: CHEST - 2 VIEW COMPARISON:  Chest x-ray dated Mar 24, 2018. FINDINGS: Stable mild cardiomegaly. Normal pulmonary vascularity. Increased patchy infiltrates in the left greater than right lower lobes. No pleural effusion or pneumothorax. No acute osseous abnormality. IMPRESSION: 1. Bilateral lower lobe pneumonia. This could also reflect aspiration given history of vomiting. Electronically Signed   By: Titus Dubin M.D.   On: 12/07/2018 17:12    1750:  Troponin mildly elevated, but pt denies CP and EKG unchanged from previous. CXR with bilateral pneumonia; IV abx ordered. Short nebs also ordered. T/C returned from [place], case discussed, including:  HPI, pertinent PM/SHx, VS/PE, dx testing, ED course and treatment:  Agrees EKG unchanged from previous, admit to [facility] service to trend troponins, and if they need Cards then can consult prn.   1845:  2nd troponin pending. 2nd EKG unchanged from previous. Flu and UA/UC pending.  Dx and testing d/w pt and family.  Questions answered.  Verb understanding, agreeable to admit.  T/C returned from [facility] Dr. Roel Cluck, case  discussed, including:  HPI, pertinent PM/SHx, VS/PE, dx testing, ED course and treatment:  Agreeable to admit.      Final Clinical Impressions(s) / ED Diagnoses   Final diagnoses:  None    ED Discharge Orders    None       Francine Graven, DO 12/09/18 2100

## 2018-12-07 NOTE — Patient Instructions (Addendum)
Go to ER at [facility] - they know you are coming.  (Tim is triage nurse I talked to)

## 2018-12-07 NOTE — ED Triage Notes (Signed)
Pt was given Z pak Sunday at [facility] but been vomiting. Today pt was seen at Dr [facility] office for worsening cough and SOB. Pt was sent to Ed for low BP and O2 saturation 70%.  Pt c/o rib cage pain when coughs.

## 2018-12-07 NOTE — ED Notes (Signed)
Date and time results received: 12/07/18 1742 (use smartphrase ".now" to insert current time)  Test: Troponin Critical Value: 0.18  Name of Provider Notified: Dr. Thurnell Garbe  Orders Received? Or Actions Taken?:

## 2018-12-07 NOTE — Progress Notes (Signed)
Subjective:    Patient ID: [name] [name], male    DOB: June 28, 1961   MRN: [id number]  Brief patient profile:  84  yobm with morbid obesity actively smoking with documented sleep apnea for which he has seen Dr. Halford Chessman with no improvement in his weight over the despite previous extensive counseling regarding calorie balance issues     History of Present Illness   11/16/2017  Acute ov/[name] re:  DJD/ new acute cough x 10 days  Chief Complaint  Patient presents with  . Acute Visit    Appt scheduled to get refill on meloxicam for hip pain. He states he has had a cold for the past wk. He c/o runny nose, cough and HA. His cough is mainly non prod but he will occ produce some clear sputum. He has an albuterol inhaler but it's expired and he has not really felt like he needs it.   noct cough /wheeze  mucus clear since acute  onset of uri x one week with rhinorrhea, has used saba in past prn but rx ran out/ rarely needs despite ongoing smoking  rec For cough/ congestion > mucinex dm up to 1200 mg every 12 hours as needed  zpak  For wheeze/ short of breath >> albuterol inhaler up to 2 puff every 4 hours as needed  If not improving > Prednisone 10 mg take  4 each am x 2 days,   2 each am x 2 days,  1 each am x 2 days and stop    03/24/2018  f/u ov/[name] re:  Cpx/ cough never > 50% better, never filled rx for  albuterol /prednisone  Chief Complaint  Patient presents with  . Annual Exam    Pt is fasting. He is c/o cough and nasal congestion for the past wk. His cough is non prod. He has had some green nasal d/c.   Dyspnea:   Not limited by breathing from desired activities   Cough: worse immediately when   w/in 5 min rhs arely takes mucinex dm  Sleep: was ok until 2 weeks prior to OV   SABA use:   Never purchased   zpak rx by UC 03/18/18 some better  Nasal obst / congestion/ usually clear  X sev years rec For cough > mucinex dm up to 1200 mg every 12 hours as needed  Ear wax remover is over the  counter  Add pepcid 20 mg after supper until next office visit GERD . Augmentin 875 mg take one pill twice daily  X 10 days - take at breakfast and supper with large glass of water.  It would help reduce the usual side effects (diarrhea and yeast infections) if you ate cultured yogurt at lunch.  Please see patient coordinator before you leave today  to schedule sinus CT in 2 weeks no sooner Please remember to go to the lab and x-ray department downstairs in the basement  for your tests - we will call you with the results when they are available.   Please schedule a follow up office visit in 6 weeks, call sooner if needed with all medications /inhalers/ solutions in hand so we can verify exactly what you are taking. This includes all medications from all doctors and over the counters  - PFTs on return  - Prevnar 13 on return    05/06/2018  f/u ov/[name] re: obesity / smoking  Chief Complaint  Patient presents with  . Follow-up    PFT's done today. Cough has  resolved.  Dyspnea:  Not limited by breathing from desired activities   Cough: resolved  Sleeping: ok flat rec Ear wax remover over the counter then see [name]  if not better   Prevnar 13 today  Although I don't endorse regular use of e cigs/ many pts find them helpful; however, I emphasized they should be considered a "one-way bridge" off all tobacco products.    07/15/18 [name] eval for osa rec History of obstructive sleep apnea Worsening symptoms, increasing weight gain, nonrestorative sleep Will order a split-night study Encouraged about weight loss efforts  see you back in the office in about 6 to 8 weeks following initiation of treatment      12/07/2018 acute extended ov/[name] re: smoker with cough/fever/ vomiting/ sob with new hypoxemia and low bp Chief Complaint  Patient presents with  . Acute Visit    Pt c/o cough and fever x 1 wk- started on zpack per UC and not improving. He is coughing up minimal yellow sputum.  He had vomiting this morning after he took zithromax. His sats on RA at rest today 70%---increased to 92% 4lpm o2.       Acute onset of "head cold" one week down to chest then to UC 1/26 > zpak> nand v  and cough med made him loupy.  Several family members also sick uri's  - pt did not get flu shot   No obvious day to day or daytime variability or assoc   mucus plugs or hemoptysis or cp or chest tightness, subjective wheeze or overt   hb symptoms.     Also denies any obvious fluctuation of symptoms with weather or environmental changes or other aggravating or alleviating factors except as outlined above   No unusual exposure hx or h/o childhood pna/ asthma or knowledge of premature birth.  Current Allergies, Complete Past Medical History, Past Surgical History, Family History, and Social History were reviewed in [employer] record.  ROS  The following are not active complaints unless bolded Hoarseness, sore throat, dysphagia, dental problems, itching, sneezing,  nasal congestion or discharge of excess mucus or purulent secretions, ear ache,   fever, chills, sweats, unintended wt loss or wt gain, classically pleuritic or exertional cp,  orthopnea pnd or arm/hand swelling  or leg swelling, presyncope, palpitations, abdominal pain, anorexia, nausea, vomiting, diarrhea  or change in bowel habits or change in bladder habits, change in stools or change in urine, dysuria, hematuria,  rash, arthralgias, visual complaints, headache, numbness, weakness or ataxia or problems with walking or coordination,  change in mood or  memory.        Current Meds  Medication Sig  . aspirin 81 MG tablet Take 81 mg by mouth daily.   [name] HYDROcodone-homatropine (HYCODAN) 5-1.5 MG/5ML syrup Take 2.5-5 mLs by mouth every 6 (six) hours as needed for up to 5 days for cough.  . meloxicam (MOBIC) 7.5 MG tablet Take 1 tablet (7.5 mg total) by mouth daily.  . tamsulosin (FLOMAX) 0.4 MG CAPS capsule Take  0.4 mg by mouth.                        Past Medical History:  HYPERTENSION (ICD-V17.4)  Hx of EPIDIDYMITIS (ICD-604.90) > Left orchiectomy age 34  OBSTRUCTIVE SLEEP APNEA (ICD-327.23)...................................Marland KitchenSood     - Stopped   cpap 2012 on his own    - Sleep titration ordered 09/06/2012 due to excess fatigue MORBID OBESITY (ICD-278.01)  -  Target wt = 208 for BMI < 30  Hyperlipidemia male, h/o hbp and borderline dm, pos fm hx= target LDL < 130  HEALTH MAINTENANCE ....................................................................  Matty Vanroekel - Tdap 08/25/2011  - Pneumovax April 25, 2009  - Colonsocopy 12/2010 [name] 03/24/2018     Family History:  Coronary Heart Disease mother onset at 29  Diabetes mother, father, older brother  No ca Father diet at 62 suddenly with neg autopsy   Social History:  Patient is a current smoker, considering to quit but not ready-1/2 PPD  Occ ETOH  Contractor but work variable  ex = [place] with wife           Objective:   Physical Exam    wt 242 April 25, 2009 > 250 August 19, 2010 > 08/25/2011  253 > 06/06/2012  260 > 260 09/06/2012 >262 02/28/2013 > 02/23/2014  276 > 05/19/2014  273>> 09/02/2016  281 > 11/16/2017   286  > 05/06/2018   288 > 12/07/2018  283    Vital signs reviewed - Note on arrival 02 sats  92% on 4lpm   But in the 70's on RA on arrival    Acutely ill obese bm/ nasal tone to voice   HEENT: nl dentition, turbinates bilaterally, and oropharynx. external ear canals with wax impaction bilaterally  Modified Mallampati Score =   3   NECK :  without JVD/Nodes/TM/ nl carotid upstrokes bilaterally   LUNGS: no acc muscle use,  Nl contour chest which is clear to A and P bilaterally without cough on insp or exp maneuvers   CV:  RRR  no s3 or murmur or increase in P2, and no edema   ABD:  Massively obese   nontender with nl inspiratory excursion in the supine position. No bruits or organomegaly  appreciated, bowel sounds nl  MS:  Nl gait/ ext warm without deformities, calf tenderness, cyanosis or clubbing No obvious joint restrictions   SKIN: warm and dry without lesions    NEURO:  alert, approp, nl sensorium with  no motor or cerebellar deficits apparent.                   Assessment & Plan:

## 2018-12-07 NOTE — H&P (Signed)
[name] [name] LSL:[id number] DOB: 02-18-1961 DOA: 12/07/2018     PCP: [name] Rockers, MD   Outpatient Specialists:     Pulmonary    Dr. Melvyn [name]   Urology Dr [name] [name]  Patient arrived to ER on 12/07/18 at 1550  Patient coming from: home Lives   With family    Chief Complaint:  Chief Complaint  Patient presents with  . Shortness of Breath    HPI: [name] [name] is a 58 y.o. male with medical history significant of recently, tobacco abuse, sleep apnea, currently disc disease    Presented with   6 day hx of fever, cough  Patient had mild symptoms for the past 1 week positive for runny nose cough headaches cough has been nonproductive although sometimes he coughs up a little bit of clear sputum he has had some wheezing he does have prescription for albuterol but that has expired.  Recently has been seen in urgent care was diagnosed with pneumonia started on a Z-Pak has been taken for the past 3 days and has had some nausea and vomiting developed left-sided take antibiotics. Was seen in Dr. Leonides [name] office today who has been acting as his primary care provider and was noted to have oxygen saturation down to 70s on room air was started on oxygen and went up to 92 on 4 L  Reports sat next to someone at church who had the flu. Did not take a flu shot this year. Fevers at home Reports chest pain only when he coughs in his ribs. Denies any dysuria  Regarding pertinent Chronic problems:   Urinary retention on Flomax History of obesity Tobacco abuse continues to smoke Obstructive sleep apnea followed by pulmonology has been referred in the past for CPAP  No prior echogram in the system  While in ER: X-ray showing bilateral pneumonia with elevated troponin up to 0.18 given abnormal EKG this was discussed with cardiology who felt that there is no STEMI repeat EKG was done without dynamic changes. The following Work up has been ordered so far:  Orders Placed This Encounter  Procedures    . Urine culture  . DG Chest 2 View  . Urinalysis, Routine w reflex microscopic  . Influenza panel by PCR (type A & B)  . Brain natriuretic peptide  . CBC with Differential  . Comprehensive metabolic panel  . Lipase, blood  . Troponin I - Add-On to previous collection  . Lactic acid, plasma  . Troponin I - Once  . If O2 Sat <94% administer O2 at 2 liters/minute via nasal cannula  . Inpatient consult to Cardiology  . Consult to hospitalist  . Droplet precaution  . Pulse oximetry, continuous  . ED EKG  . EKG 12-Lead  . Repeat EKG  . EKG 12-Lead  . Saline lock IV    Following Medications were ordered in ER: Medications  ondansetron (ZOFRAN) injection 4 mg (4 mg Intravenous Given 12/07/18 1756)  cefTRIAXone (ROCEPHIN) 1 g in sodium chloride 0.9 % 100 mL IVPB (1 g Intravenous New Bag/Given 12/07/18 1823)  doxycycline (VIBRA-TABS) tablet 100 mg (has no administration in time range)  0.9 %  sodium chloride infusion (has no administration in time range)  ipratropium-albuterol (DUONEB) 0.5-2.5 (3) MG/3ML nebulizer solution 3 mL (has no administration in time range)  albuterol (PROVENTIL) (2.5 MG/3ML) 0.083% nebulizer solution 2.5 mg (has no administration in time range)  ipratropium-albuterol (DUONEB) 0.5-2.5 (3) MG/3ML nebulizer solution 3 mL (3 mLs Nebulization Given 12/07/18 1742)  albuterol (  PROVENTIL) (2.5 MG/3ML) 0.083% nebulizer solution 2.5 mg (2.5 mg Nebulization Given 12/07/18 1742)    Significant initial  Findings: Abnormal Labs Reviewed  BRAIN NATRIURETIC PEPTIDE - Abnormal; Notable for the following components:      Result Value   B Natriuretic Peptide 199.6 (*)    All other components within normal limits  CBC WITH DIFFERENTIAL/PLATELET - Abnormal; Notable for the following components:   MCHC 29.9 (*)    RDW 16.7 (*)    Monocytes Absolute 1.1 (*)    All other components within normal limits  COMPREHENSIVE METABOLIC PANEL - Abnormal; Notable for the following components:    Chloride 97 (*)    Glucose, Bld 121 (*)    BUN 21 (*)    Calcium 8.6 (*)    All other components within normal limits  TROPONIN I - Abnormal; Notable for the following components:   Troponin I 0.18 (*)    All other components within normal limits     Lactic Acid, Venous    Component Value Date/Time   LATICACIDVEN 0.9 12/07/2018 1730    Na 136 K 4.1 BUN 21  Cr Up from baseline see below Lab Results  Component Value Date   CREATININE 1.04 12/07/2018   CREATININE 0.90 03/24/2018   CREATININE 0.89 01/11/2018     WBC  9.5  HG/HCT    stable,       Component Value Date/Time   HGB 14.0 12/07/2018 1615   HCT 46.9 12/07/2018 1615    Troponin (Point of Care Test) No results for input(s): TROPIPOC in the last 72 hours.  BNP (last 3 results) Recent Labs    12/07/18 1615  BNP 199.6*        CXR -bilateral lower lobe pneumonia   ECG:  Personally reviewed by me showing: HR : 86 Rhythm:  NSR,   nonspecific changes,  QTC 433     ED Triage Vitals [12/07/18 1603]  Enc Vitals Group     BP 111/76     Pulse Rate 90     Resp (!) 22     Temp 98.6 F (37 C)     Temp Source Oral     SpO2 95 %     Weight 282 lb (127.9 kg)     Height 5\' 11"  (1.803 m)     Head Circumference      Peak Flow      Pain Score      Pain Loc      Pain Edu?      Excl. in [place]?   WOEH(21)@       Latest  Blood pressure 113/73, pulse 88, temperature 98.6 F (37 C), temperature source Oral, resp. rate 18, height 5\' 11"  (1.803 m), weight 127.9 kg, SpO2 93 %.    Hospitalist was called for admission for CAP and hypoxia   Review of Systems:    Pertinent positives include:  Fevers, chills, fatigue,  shortness of breath at rest non-productive cough Constitutional:  No weight loss, night sweats,weight loss  HEENT:  No headaches, Difficulty swallowing,Tooth/dental problems,Sore throat,  No sneezing, itching, ear ache, nasal congestion, post nasal drip,  Cardio-vascular:  No chest pain,  Orthopnea, PND, anasarca, dizziness, palpitations.no Bilateral lower extremity swelling  GI:  No heartburn, indigestion, abdominal pain, nausea, vomiting, diarrhea, change in bowel habits, loss of appetite, melena, blood in stool, hematemesis Resp:    No dyspnea on exertion, No excess mucus, no productive cough, No, No coughing up of blood.No change  in color of mucus.No wheezing. Skin:  no rash or lesions. No jaundice GU:  no dysuria, change in color of urine, no urgency or frequency. No straining to urinate.  No flank pain.  Musculoskeletal:  No joint pain or no joint swelling. No decreased range of motion. No back pain.  Psych:  No change in mood or affect. No depression or anxiety. No memory loss.  Neuro: no localizing neurological complaints, no tingling, no weakness, no double vision, no gait abnormality, no slurred speech, no confusion  All systems reviewed and apart from [place] all are negative  Past Medical History:   Past Medical History:  Diagnosis Date  . Arthritis   . BPH (benign prostatic hyperplasia)   . Epididymitis   . Hyperlipidemia   . Morbid obesity ([place])   . OSA (obstructive sleep apnea)   . Upper airway cough syndrome       Past Surgical History:  Procedure Laterality Date  . TESTICLE REMOVAL  age 56    Social History:  Ambulatory    independently     reports that he has been smoking cigarettes. He has a 12.50 pack-year smoking history. He has never used smokeless tobacco. He reports current alcohol use of about 3.0 - 4.0 standard drinks of alcohol per week. He reports that he does not use drugs.     Family History:   Family History  Problem Relation Age of Onset  . Coronary artery disease Mother 75  . Diabetes Mother   . Heart disease Mother   . Hyperlipidemia Mother   . Diabetes Father   . Heart disease Father   . Diabetes Brother        older brother  . Cancer Neg Hx     Allergies: No Known Allergies   Prior to Admission medications    Medication Sig Start Date End Date Taking? Authorizing Provider  aspirin 81 MG tablet Take 81 mg by mouth daily.    Yes [provider]  azithromycin (ZITHROMAX) 250 MG tablet Take 250 mg by mouth daily.   Yes [provider]  HYDROcodone-homatropine (HYCODAN) 5-1.5 MG/5ML syrup Take 2.5-5 mLs by mouth every 6 (six) hours as needed for up to 5 days for cough. 12/04/18 12/09/18 Yes Tereasa Coop, PA-C  ibuprofen (ADVIL,MOTRIN) 200 MG tablet Take 400-600 mg by mouth 2 (two) times daily as needed for fever or moderate pain.   Yes [provider]  meloxicam (MOBIC) 7.5 MG tablet Take 1 tablet (7.5 mg total) by mouth daily. 11/16/17  Yes [name] Rockers, MD  tamsulosin (FLOMAX) 0.4 MG CAPS capsule Take 0.4 mg by mouth.   Yes [provider]   Physical Exam: Blood pressure 113/73, pulse 88, temperature 98.6 F (37 C), temperature source Oral, resp. rate 18, height 5\' 11"  (1.803 m), weight 127.9 kg, SpO2 93 %. 1. General:  in No increased work of breathing   acutely ill-appearing 2. Psychological: Alert and  Oriented 3. Head/ENT:    Dry Mucous Membranes                          Head Non traumatic, neck supple                            Poor Dentition 4. SKIN:   decreased Skin turgor,  Skin clean Dry and intact no rash 5. Heart: Regular rate and rhythm no Murmur, no Rub  or gallop 6. Lungs: some wheezes or crackles   7. Abdomen: Soft,  non-tender, Non distended  obese   8. Lower extremities: no clubbing, cyanosis, no  edema 9. Neurologically Grossly intact, moving all 4 extremities equally  10. MSK: Normal range of motion   LABS:     Recent Labs  Lab 12/07/18 1615  WBC 9.5  NEUTROABS 7.0  HGB 14.0  HCT 46.9  MCV 90.4  PLT 637   Basic Metabolic Panel: Recent Labs  Lab 12/07/18 1615  NA 136  K 4.1  CL 97*  CO2 31  GLUCOSE 121*  BUN 21*  CREATININE 1.04  CALCIUM 8.6*      Recent Labs  Lab 12/07/18 1615  AST 35  ALT 28  ALKPHOS 70    BILITOT 0.3  PROT 7.5  ALBUMIN 3.6   Recent Labs  Lab 12/07/18 1615  LIPASE 32   No results for input(s): AMMONIA in the last 168 hours.    HbA1C: No results for input(s): HGBA1C in the last 72 hours. CBG: No results for input(s): GLUCAP in the last 168 hours.    Urine analysis:    Component Value Date/Time   COLORURINE YELLOW 08/17/2018 2013   APPEARANCEUR CLEAR 08/17/2018 2013   LABSPEC 1.009 08/17/2018 2013   PHURINE 7.0 08/17/2018 2013   GLUCOSEU NEGATIVE 08/17/2018 2013   GLUCOSEU NEGATIVE 03/24/2018 0958   HGBUR SMALL (A) 08/17/2018 2013   BILIRUBINUR NEGATIVE 08/17/2018 2013   [place] NEGATIVE 08/17/2018 2013   PROTEINUR NEGATIVE 08/17/2018 2013   UROBILINOGEN 1.0 03/24/2018 0958   NITRITE NEGATIVE 08/17/2018 2013   LEUKOCYTESUR TRACE (A) 08/17/2018 2013      Cultures:    Component Value Date/Time   SDES URINE, CATHETERIZED 08/17/2018 2013   [place] NONE 08/17/2018 2013   CULT  08/17/2018 2013    NO GROWTH Performed at [facility], [place] [address], [place],  [address]    REPTSTATUS 08/19/2018 FINAL 08/17/2018 2013     Radiological Exams on Admission: Dg Chest 2 View  Result Date: 12/07/2018 CLINICAL DATA:  Cough, shortness of breath, and wheezing. EXAM: CHEST - 2 VIEW COMPARISON:  Chest x-ray dated Mar 24, 2018. FINDINGS: Stable mild cardiomegaly. Normal pulmonary vascularity. Increased patchy infiltrates in the left greater than right lower lobes. No pleural effusion or pneumothorax. No acute osseous abnormality. IMPRESSION: 1. Bilateral lower lobe pneumonia. This could also reflect aspiration given history of vomiting. Electronically Signed   By: Titus Dubin M.D.   On: 12/07/2018 17:12    Chart has been reviewed   Assessment/Plan   58 y.o. male with medical history significant of recently, tobacco abuse, sleep apnea, currently disc disease    Admitted for CAP with respiratory failure with hypoxia due to INFLUENZA  A  Present on Admission: . CAP (community acquired pneumonia) - in the setting of influenza A will treat And doxycycline Rocephin already ordered in the ER has been recently on a Z-Pak Likely not improving secondary to underlying viral illness tobacco abuse and underlying lung disease rather than true failure of antibiotics  - will admit for treatment of CAP will start on appropriate antibiotic coverage.   Obtain:  sputum cultures,                   blood cultures if febrile or if decompensates.                   strep pneumo UA antigen,  Provide oxygen as needed.   . Reactive airway disease with wheezing with acute exacerbation -  -  - Will initiate: low dose Steroid taper  -  Antibiotics  Doxycycline, -   XopenexPRN, - scheduled duoneb,      -  Mucinex.  Titrate O2 to saturation >90%. Follow patients respiratory status. Check VBG.  Currently mentating well no evidence of symptomatic hypercarbia  . Elevated troponin -chest pain-free likely demand related will order echogram continue to cycle cardiac enzymes monitor on telemetry, email cardiology will likely need follow-up . Acute respiratory failure with hypoxia (HCC) -secondary to underlying influenza A community-acquired pneumonia and underlying lung disease, given severe increase in oxygen requirement monitor in stepdown overnight . Tobacco abuse -  - Spoke about importance of quitting spent 5 minutes discussing options for treatment, prior attempts at quitting, and dangers of smoking  -At this point patient is     interested in quitting  - refused nicotine patch   - nursing tobacco cessation protocol    . Influenza A on tamiflu  Pyuria -history of BPH but denies any recent dysuria with urine culture patient already on antibiotics which should cover cover urine as well  Other plan as per orders.  DVT prophylaxis:   Lovenox     Code Status:  FULL CODE   as per patient   I had personally discussed CODE STATUS  with patient and family   Family Communication:   Family   at  Bedside  plan of care was discussed with Wife  Disposition Plan:     To home once workup is complete and patient is stable                  Would benefit from PT/OT eval prior to DC  Ordered                                      Consults called: none   Admission status:    inpatient     Expect 2 midnight stay secondary to severity of patient's current illness including   hemodynamic instability despite optimal treatment (tachycardia  Hypoxia   )   Severe lab/radiological/exam abnormalities including:   Viral pneumonia bilateral     I expect  patient to be hospitalized for 2 midnights requiring inpatient medical care.  Patient is at high risk for adverse outcome (such as loss of life or disability) if not treated.  Indication for inpatient stay as follows:    Hemodynamic instability despite maximal medical therapy,        New or worsening hypoxia  Need for IV antibiotics, IV fluids       Level of care         SDU tele indefinitely please discontinue once patient no longer qualifies      Max Romano 12/07/2018, 8:48 PM    [facility]     after 2 AM please page floor coverage PA If 7AM-7PM, please contact the day team taking care of the patient using [email or web address]

## 2018-12-08 ENCOUNTER — Inpatient Hospital Stay (HOSPITAL_COMMUNITY): Payer: BC Managed Care – PPO

## 2018-12-08 ENCOUNTER — Ambulatory Visit: Payer: BC Managed Care – PPO | Admitting: Internal Medicine

## 2018-12-08 DIAGNOSIS — R9431 Abnormal electrocardiogram [ECG] [EKG]: Secondary | ICD-10-CM

## 2018-12-08 DIAGNOSIS — J189 Pneumonia, unspecified organism: Secondary | ICD-10-CM

## 2018-12-08 LAB — COMPREHENSIVE METABOLIC PANEL
ALT: 24 U/L (ref 0–44)
AST: 26 U/L (ref 15–41)
Albumin: 3.2 g/dL — ABNORMAL LOW (ref 3.5–5.0)
Alkaline Phosphatase: 64 U/L (ref 38–126)
Anion gap: 6 (ref 5–15)
BILIRUBIN TOTAL: 0.3 mg/dL (ref 0.3–1.2)
BUN: 17 mg/dL (ref 6–20)
CO2: 33 mmol/L — ABNORMAL HIGH (ref 22–32)
CREATININE: 0.88 mg/dL (ref 0.61–1.24)
Calcium: 8.3 mg/dL — ABNORMAL LOW (ref 8.9–10.3)
Chloride: 99 mmol/L (ref 98–111)
GFR calc Af Amer: >60 mL/min (ref >60)
GFR calc non Af Amer: >60 mL/min (ref >60)
Glucose, Bld: 122 mg/dL — ABNORMAL HIGH (ref 70–99)
Potassium: 4.6 mmol/L (ref 3.5–5.1)
Sodium: 138 mmol/L (ref 135–145)
Total Protein: 7 g/dL (ref 6.5–8.1)

## 2018-12-08 LAB — BLOOD GAS, VENOUS
ACID-BASE EXCESS: 6.6 mmol/L — AB (ref 0.0–2.0)
Bicarbonate: 35.5 mmol/L — ABNORMAL HIGH (ref 20.0–28.0)
O2 SAT: 46.8 %
Patient temperature: 98.6
pCO2, Ven: 74.5 mmHg (ref 44.0–60.0)
pH, Ven: 7.3 (ref 7.250–7.430)

## 2018-12-08 LAB — EXPECTORATED SPUTUM ASSESSMENT W GRAM STAIN, RFLX TO RESP C: Special Requests: NORMAL

## 2018-12-08 LAB — HIV ANTIBODY (ROUTINE TESTING W REFLEX): HIV SCREEN 4TH GENERATION: NONREACTIVE

## 2018-12-08 LAB — CBC
HCT: 44.5 % (ref 39.0–52.0)
Hemoglobin: 13 g/dL (ref 13.0–17.0)
MCH: 26 pg (ref 26.0–34.0)
MCHC: 29.2 g/dL — ABNORMAL LOW (ref 30.0–36.0)
MCV: 89 fL (ref 80.0–100.0)
PLATELETS: 239 10*3/uL (ref 150–400)
RBC: 5 MIL/uL (ref 4.22–5.81)
RDW: 16.5 % — AB (ref 11.5–15.5)
WBC: 9.8 10*3/uL (ref 4.0–10.5)
nRBC: 0 % (ref 0.0–0.2)

## 2018-12-08 LAB — TROPONIN I
Troponin I: 0.08 ng/mL (ref <0.03)
Troponin I: 0.06 ng/mL (ref <0.03)
Troponin I: 0.06 ng/mL (ref <0.03)

## 2018-12-08 LAB — TSH: TSH: 2.168 u[IU]/mL (ref 0.350–4.500)

## 2018-12-08 LAB — ECHOCARDIOGRAM COMPLETE
Height: 71 in
Weight: 4512 oz

## 2018-12-08 LAB — MAGNESIUM: Magnesium: 2.2 mg/dL (ref 1.7–2.4)

## 2018-12-08 LAB — STREP PNEUMONIAE URINARY ANTIGEN: Strep Pneumo Urinary Antigen: NEGATIVE

## 2018-12-08 LAB — PHOSPHORUS: Phosphorus: 3.4 mg/dL (ref 2.5–4.6)

## 2018-12-08 MED ORDER — ACETAMINOPHEN 650 MG RE SUPP: 650.0000 mg | Freq: Four times a day (QID) | RECTAL | Status: DC | PRN

## 2018-12-08 MED ORDER — GUAIFENESIN ER 600 MG PO TB12
600.0000 mg | ORAL_TABLET | Freq: Two times a day (BID) | ORAL | Status: DC
  Administered 2018-12-08 – 2018-12-12 (×10): 600 mg via ORAL
  Filled 2018-12-08 (×10): qty 1

## 2018-12-08 MED ORDER — ACETAMINOPHEN 325 MG PO TABS
650.0000 mg | ORAL_TABLET | Freq: Four times a day (QID) | ORAL | Status: DC | PRN
  Administered 2018-12-10 – 2018-12-11 (×2): 650 mg via ORAL
  Filled 2018-12-08 (×3): qty 2

## 2018-12-08 MED ORDER — SODIUM CHLORIDE 0.9 % IV SOLN
INTRAVENOUS | Status: AC
  Administered 2018-12-08: 05:00:00 via INTRAVENOUS

## 2018-12-08 MED ORDER — ONDANSETRON HCL 4 MG PO TABS
4.0000 mg | ORAL_TABLET | Freq: Four times a day (QID) | ORAL | Status: DC | PRN
  Administered 2018-12-11: 4 mg via ORAL
  Filled 2018-12-08: qty 1

## 2018-12-08 MED ORDER — ONDANSETRON HCL 4 MG/2ML IJ SOLN
4.0000 mg | Freq: Four times a day (QID) | INTRAMUSCULAR | Status: DC | PRN
  Administered 2018-12-10: 4 mg via INTRAVENOUS
  Filled 2018-12-08: qty 2

## 2018-12-08 MED ORDER — IPRATROPIUM-ALBUTEROL 0.5-2.5 (3) MG/3ML IN SOLN
3.0000 mL | Freq: Four times a day (QID) | RESPIRATORY_TRACT | Status: DC
  Administered 2018-12-08 – 2018-12-09 (×6): 3 mL via RESPIRATORY_TRACT
  Filled 2018-12-08 (×6): qty 3

## 2018-12-08 MED ORDER — ALBUTEROL SULFATE (2.5 MG/3ML) 0.083% IN NEBU: 2.5000 mg | INHALATION_SOLUTION | RESPIRATORY_TRACT | Status: DC | PRN

## 2018-12-08 MED ORDER — ALBUTEROL SULFATE (2.5 MG/3ML) 0.083% IN NEBU
INHALATION_SOLUTION | RESPIRATORY_TRACT | Status: AC
  Filled 2018-12-08: qty 3

## 2018-12-08 MED ORDER — ASPIRIN EC 81 MG PO TBEC
81.0000 mg | DELAYED_RELEASE_TABLET | Freq: Every day | ORAL | Status: DC
  Administered 2018-12-08 – 2018-12-12 (×5): 81 mg via ORAL
  Filled 2018-12-08 (×5): qty 1

## 2018-12-08 MED ORDER — ENOXAPARIN SODIUM 60 MG/0.6ML ~~LOC~~ SOLN
60.0000 mg | SUBCUTANEOUS | Status: DC
  Administered 2018-12-08 – 2018-12-12 (×5): 60 mg via SUBCUTANEOUS
  Filled 2018-12-08 (×5): qty 0.6

## 2018-12-08 MED ORDER — LEVALBUTEROL HCL 0.63 MG/3ML IN NEBU: 0.6300 mg | INHALATION_SOLUTION | Freq: Four times a day (QID) | RESPIRATORY_TRACT | Status: DC | PRN

## 2018-12-08 MED ORDER — PREDNISONE 20 MG PO TABS
40.0000 mg | ORAL_TABLET | Freq: Every day | ORAL | Status: AC
  Administered 2018-12-08 – 2018-12-12 (×5): 40 mg via ORAL
  Filled 2018-12-08 (×5): qty 2

## 2018-12-08 MED ORDER — TAMSULOSIN HCL 0.4 MG PO CAPS
0.4000 mg | ORAL_CAPSULE | Freq: Every day | ORAL | Status: DC
  Administered 2018-12-08 – 2018-12-12 (×5): 0.4 mg via ORAL
  Filled 2018-12-08 (×5): qty 1

## 2018-12-08 NOTE — ED Notes (Signed)
Placed pt in hospital bed

## 2018-12-08 NOTE — ED Notes (Signed)
I have just phoned report to [place], RN on [place]. Will transport shortly. He remains in no distress.

## 2018-12-08 NOTE — Progress Notes (Signed)
PROGRESS NOTE    [name] [name]  LZJ:[id number] DOB: 01-18-1961 DOA: 12/07/2018 PCP: Tanda Rockers, MD  Brief Narrative: 57 y.o. male with medical history significant of recently, tobacco abuse, sleep apnea, currently disc disease    Presented with   6 day hx of fever, cough  Patient had mild symptoms for the past 1 week positive for runny nose cough headaches cough has been nonproductive although sometimes he coughs up a little bit of clear sputum he has had some wheezing he does have prescription for albuterol but that has expired.  Recently has been seen in urgent care was diagnosed with pneumonia started on a Z-Pak has been taken for the past 3 days and has had some nausea and vomiting developed left-sided take antibiotics. Was seen in Dr. Leonides Schanz office today who has been acting as his primary care provider and was noted to have oxygen saturation down to 70s on room air was started on oxygen and went up to 92 on 4 L  Reports sat next to someone at church who had the flu. Did not take a flu shot this year. Fevers at home Reports chest pain only when he coughs in his ribs. Denies any dysuria  Regarding pertinent Chronic problems:   Urinary retention on Flomax History of obesity Tobacco abuse continues to smoke Obstructive sleep apnea followed by pulmonology has been referred in the past for CPAP  No prior echogram in the system  While in ER: X-ray showing bilateral pneumonia with elevated troponin up to 0.18 given abnormal EKG this was discussed with cardiology who felt that there is no STEMI repeat EKG was done without dynamic changes. Assessment & Plan:   Principal Problem:   CAP (community acquired pneumonia) Active Problems:   Cigarette smoker   Elevated troponin   Acute respiratory failure with hypoxia (HCC)   Tobacco abuse   Influenza A   Reactive airway disease with wheezing with acute exacerbation   Pyuria   #1 acute hypoxic respiratory failure secondary to  community-acquired pneumonia, COPD, influenza A-patient presented with complaints of fever cough and shortness of breath.  He was found to have bibasilar pneumonia and influenza A.  Continue doxycycline Rocephin and Tamiflu.  Continue nebulizer treatments.  Patient is still requiring 5 L of oxygen to keep his saturation above 92%.  Patient is not on oxygen at home.  Patient continues to smoke to the time he came into the hospital.  He was sleeping as I walked into the room but he was able to follow the conversation and answer my questions appropriately.  Continue prednisone and Xopenex.  #2 pleuritic chest pain with elevated troponin peaked at 0.18 with no acute EKG changes probably demand ischemia monitor closely. Follow-up echocardiogram.  #3 sleep apnea with obesity wife reports that he was supposed to have a sleep study done tomorrow and follow-up with pulmonary.  #4 BPH with pyuria patient asymptomatic from urology standpoint.  Continue Flomax.    Estimated body mass index is 39.33 kg/m as calculated from the following:   Height as of this encounter: 5\' 11"  (1.803 m).   Weight as of this encounter: 127.9 kg.  DVT prophylaxis: Lovenox Code Status: Full code Family Communication: Discussed with wife Disposition Plan: Pending clinical progress  Consultants: None  Procedures: None Antimicrobials: Doxycycline and Rocephin  Subjective: Resting in bed reports he did not sleep well last night denies any chest pain complains of shortness of breath and dyspnea on exertion wife by the bedside  Objective:  Vitals:   12/08/18 0900 12/08/18 0930 12/08/18 0932 12/08/18 1000  BP: 116/64 119/60 119/60 (!) 113/57  Pulse: 88 87 90 97  Resp: (!) 21 19 (!) 22 (!) 26  Temp:      TempSrc:      SpO2: 90% (!) 85% 90% 91%  Weight:      Height:       No intake or output data in the 24 hours ending 12/08/18 1112 Filed Weights   12/07/18 1603  Weight: 127.9 kg    Examination:  General exam:  Appears calm and comfortable  Respiratory system: Scattered rhonchi and wheezing bilaterally  to auscultation. Respiratory effort normal. Cardiovascular system: S1 & S2 heard, RRR. No JVD, murmurs, rubs, gallops or clicks. No pedal edema. Gastrointestinal system: Abdomen is nondistended, soft and nontender. No organomegaly or masses felt. Normal bowel sounds heard. Central nervous system: Alert and oriented. No focal neurological deficits. Extremities: Trace bilateral pitting edema. Skin: No rashes, lesions or ulcers Psychiatry: Judgement and insight appear normal. Mood & affect appropriate.     Data Reviewed: I have personally reviewed following labs and imaging studies  CBC: Recent Labs  Lab 12/07/18 1615 12/08/18 0500  WBC 9.5 9.8  NEUTROABS 7.0  --   HGB 14.0 13.0  HCT 46.9 44.5  MCV 90.4 89.0  PLT 222 443   Basic Metabolic Panel: Recent Labs  Lab 12/07/18 1615 12/08/18 0500  NA 136 138  K 4.1 4.6  CL 97* 99  CO2 31 33*  GLUCOSE 121* 122*  BUN 21* 17  CREATININE 1.04 0.88  CALCIUM 8.6* 8.3*  MG  --  2.2  PHOS  --  3.4   GFR: Estimated Creatinine Clearance: 123.1 mL/min (by C-G formula based on SCr of 0.88 mg/dL). Liver Function Tests: Recent Labs  Lab 12/07/18 1615 12/08/18 0500  AST 35 26  ALT 28 24  ALKPHOS 70 64  BILITOT 0.3 0.3  PROT 7.5 7.0  ALBUMIN 3.6 3.2*   Recent Labs  Lab 12/07/18 1615  LIPASE 32   No results for input(s): AMMONIA in the last 168 hours. Coagulation Profile: No results for input(s): INR, PROTIME in the last 168 hours. Cardiac Enzymes: Recent Labs  Lab 12/07/18 1615 12/07/18 1854 12/08/18 0317  TROPONINI 0.18* 0.13* 0.08*   BNP (last 3 results) No results for input(s): PROBNP in the last 8760 hours. HbA1C: No results for input(s): HGBA1C in the last 72 hours. CBG: No results for input(s): GLUCAP in the last 168 hours. Lipid Profile: No results for input(s): CHOL, HDL, LDLCALC, TRIG, CHOLHDL, LDLDIRECT in the  last 72 hours. Thyroid Function Tests: Recent Labs    12/08/18 0500  TSH 2.168   Anemia Panel: No results for input(s): VITAMINB12, FOLATE, FERRITIN, TIBC, IRON, RETICCTPCT in the last 72 hours. Sepsis Labs: Recent Labs  Lab 12/07/18 1730 12/07/18 1930  LATICACIDVEN 0.9 1.1    Recent Results (from the past 240 hour(s))  Culture, sputum-assessment     Status: None   Collection Time: 12/08/18  5:00 AM  Result Value Ref Range Status   Specimen Description SPUTUM  Final   Special Requests Normal  Final   Sputum evaluation   Final    THIS SPECIMEN IS ACCEPTABLE FOR SPUTUM CULTURE Performed at [facility], [place] [address] [place], [place] [address]    Report Status 12/08/2018 FINAL  Final  Culture, respiratory     Status: None (Preliminary result)   Collection Time: 12/08/18  5:00 AM  Result Value Ref Range Status   Specimen Description   Final    SPUTUM Performed at [place] [address] [place], [place] [address]    Special Requests   Final    Normal Reflexed from [phone] Performed at [facility], [place] [address], [place], [place] [address]    Gram Stain   Final    FEW WBC PRESENT, PREDOMINANTLY PMN FEW GRAM POSITIVE RODS FEW GRAM POSITIVE COCCI IN CLUSTERS FEW GRAM NEGATIVE RODS Performed at [facility], [place] [address] [place],  [address]    Culture PENDING  Incomplete   Report Status PENDING  Incomplete         Radiology Studies: Dg Chest 2 View  Result Date: 12/07/2018 CLINICAL DATA:  Cough, shortness of breath, and wheezing. EXAM: CHEST - 2 VIEW COMPARISON:  Chest x-ray dated Mar 24, 2018. FINDINGS: Stable mild cardiomegaly. Normal pulmonary vascularity. Increased patchy infiltrates in the left greater than right lower lobes. No pleural effusion or pneumothorax. No acute osseous abnormality. IMPRESSION: 1. Bilateral lower lobe pneumonia. This could also reflect aspiration given  history of vomiting. Electronically Signed   By: Titus Dubin M.D.   On: 12/07/2018 17:12        Scheduled Meds: . albuterol      . aspirin EC  81 mg Oral Daily  . doxycycline  100 mg Oral Q12H  . enoxaparin (LOVENOX) injection  60 mg Subcutaneous Q24H  . guaiFENesin  600 mg Oral BID  . ipratropium-albuterol  3 mL Nebulization Q6H  . oseltamivir  75 mg Oral BID  . predniSONE  40 mg Oral Q breakfast  . tamsulosin  0.4 mg Oral Daily   Continuous Infusions: . sodium chloride Stopped (12/08/18 0336)  . sodium chloride 75 mL/hr at 12/08/18 0506  . cefTRIAXone (ROCEPHIN)  IV Stopped (12/07/18 1915)     LOS: 1 day     Georgette Shell, MD [facility]  If 7PM-7AM, please contact night-coverage [email or web address] Password [email or web address] 12/08/2018, 11:12 AM

## 2018-12-08 NOTE — ED Notes (Signed)
Asked for a sputum sample pt not ready  Wants to sleep

## 2018-12-08 NOTE — ED Notes (Signed)
ED TO INPATIENT HANDOFF REPORT  Name/Age/Gender [name] [name] 58 y.o. male  Code Status    Code Status Orders  (From admission, onward)         Start     Ordered   12/08/18 0317  Full code  Continuous     12/08/18 0316        Code Status History    This patient has a current code status but no historical code status.      Home/SNF/Other Home  Chief Complaint fever, hypotension  Level of Care/Admitting Diagnosis ED Disposition    ED Disposition Condition Comment   Admit  Hospital Area: [facility] [[id number]]  Level of Care: Telemetry [5]  Admit to tele based on following criteria: Monitor for Ischemic changes  Diagnosis: CAP (community acquired pneumonia) [621308]  Admitting Physician: Toy Baker [[id number]]  Attending Physician: Toy Baker [[id number]]  Estimated length of stay: past midnight tomorrow  Certification:: I certify this patient will need inpatient services for at least 2 midnights  PT Class (Do Not Modify): Inpatient [101]  PT Acc Code (Do Not Modify): Private [1]       Medical History Past Medical History:  Diagnosis Date  . Arthritis   . BPH (benign prostatic hyperplasia)   . Epididymitis   . Hyperlipidemia   . Morbid obesity ([place])   . OSA (obstructive sleep apnea)   . Upper airway cough syndrome     Allergies No Known Allergies  IV Location/Drains/Wounds Patient Lines/Drains/Airways Status   Active Line/Drains/Airways    Name:   Placement date:   Placement time:   Site:   Days:   Peripheral IV 12/07/18 Right Antecubital   12/07/18    1823    Antecubital   1   Peripheral IV 12/08/18 Right Antecubital   12/08/18    0434    Antecubital   less than 1   Urethral Catheter Ryland K RN Latex;Straight-tip 16 Fr.   08/14/18    2202    Latex;Straight-tip   116          Labs/Imaging Results for orders placed or performed during the hospital encounter of 12/07/18 (from the past 48 hour(s))  Brain natriuretic  peptide     Status: Abnormal   Collection Time: 12/07/18  4:15 PM  Result Value Ref Range   B Natriuretic Peptide 199.6 (H) 0.0 - 100.0 pg/mL    Comment: Performed at [facility], [place] [address], [place], [place] [address]  CBC with Differential     Status: Abnormal   Collection Time: 12/07/18  4:15 PM  Result Value Ref Range   WBC 9.5 4.0 - 10.5 K/uL   RBC 5.19 4.22 - 5.81 MIL/uL   Hemoglobin 14.0 13.0 - 17.0 g/dL   HCT 46.9 39.0 - 52.0 %   MCV 90.4 80.0 - 100.0 fL   MCH 27.0 26.0 - 34.0 pg   MCHC 29.9 (L) 30.0 - 36.0 g/dL   RDW 16.7 (H) 11.5 - 15.5 %   Platelets 222 150 - 400 K/uL   nRBC 0.0 0.0 - 0.2 %   Neutrophils Relative % 74 %   Neutro Abs 7.0 1.7 - 7.7 K/uL   Lymphocytes Relative 13 %   Lymphs Abs 1.3 0.7 - 4.0 K/uL   Monocytes Relative 12 %   Monocytes Absolute 1.1 (H) 0.1 - 1.0 K/uL   Eosinophils Relative 0 %   Eosinophils Absolute 0.0 0.0 - 0.5 K/uL   Basophils Relative 0 %  Basophils Absolute 0.0 0.0 - 0.1 K/uL   Immature Granulocytes 1 %   Abs Immature Granulocytes 0.07 0.00 - 0.07 K/uL    Comment: Performed at [facility], [place] [address] [place], [place] [address]  Comprehensive metabolic panel     Status: Abnormal   Collection Time: 12/07/18  4:15 PM  Result Value Ref Range   Sodium 136 135 - 145 mmol/L   Potassium 4.1 3.5 - 5.1 mmol/L   Chloride 97 (L) 98 - 111 mmol/L   CO2 31 22 - 32 mmol/L   Glucose, Bld 121 (H) 70 - 99 mg/dL   BUN 21 (H) 6 - 20 mg/dL   Creatinine, Ser 1.04 0.61 - 1.24 mg/dL   Calcium 8.6 (L) 8.9 - 10.3 mg/dL   Total Protein 7.5 6.5 - 8.1 g/dL   Albumin 3.6 3.5 - 5.0 g/dL   AST 35 15 - 41 U/L   ALT 28 0 - 44 U/L   Alkaline Phosphatase 70 38 - 126 U/L   Total Bilirubin 0.3 0.3 - 1.2 mg/dL   GFR calc non Af Amer >60 >60 mL/min   GFR calc Af Amer >60 >60 mL/min   Anion gap 8 5 - 15    Comment: Performed at  [facility], [place] [address], [place], [place] [address]  Lipase,  blood     Status: None   Collection Time: 12/07/18  4:15 PM  Result Value Ref Range   Lipase 32 11 - 51 U/L    Comment: Performed at [facility], [place] [address], [place], [place] [address]  Troponin I - Add-On to previous collection     Status: Abnormal   Collection Time: 12/07/18  4:15 PM  Result Value Ref Range   Troponin I 0.18 (HH) <0.03 ng/mL    Comment: CRITICAL RESULT CALLED TO, READ BACK BY AND VERIFIED WITH: Long Brimage,RN [id number] @ 1741 BY J SCOTTON Performed at [place] [address] [place], [place] [address]   Lactic acid, plasma     Status: None   Collection Time: 12/07/18  5:30 PM  Result Value Ref Range   Lactic Acid, Venous 0.9 0.5 - 1.9 mmol/L    Comment: Performed at [facility], [place] [address] [place], [place] [address]  Influenza panel by PCR (type A & B)     Status: Abnormal   Collection Time: 12/07/18  5:45 PM  Result Value Ref Range   Influenza A By PCR POSITIVE (A) NEGATIVE   Influenza B By PCR NEGATIVE NEGATIVE    Comment: (NOTE) The Xpert Xpress Flu assay is intended as an aid in the diagnosis of  influenza and should not be used as a sole basis for treatment.  This  assay is FDA approved for nasopharyngeal swab specimens only. Nasal  washings and aspirates are unacceptable for Xpert Xpress Flu testing. Performed at [facility], [place] [address] [place], [place] [address]   Urinalysis, Routine w reflex microscopic     Status: Abnormal   Collection Time: 12/07/18  5:46 PM  Result Value Ref Range   Color, Urine AMBER (A) YELLOW    Comment: BIOCHEMICALS MAY BE AFFECTED BY COLOR   APPearance HAZY (A) CLEAR   Specific Gravity, Urine 1.027 1.005 - 1.030   pH 5.0 5.0 - 8.0   Glucose, UA NEGATIVE NEGATIVE mg/dL   Hgb urine dipstick MODERATE (A) NEGATIVE   Bilirubin Urine NEGATIVE NEGATIVE   Ketones,  ur 5 (A) NEGATIVE mg/dL   Protein, ur 100 (A) NEGATIVE mg/dL   Nitrite  NEGATIVE NEGATIVE   Leukocytes, UA SMALL (A) NEGATIVE   RBC / HPF >50 (H) 0 - 5 RBC/hpf   WBC, UA 21-50 0 - 5 WBC/hpf   Bacteria, UA MANY (A) NONE SEEN   Squamous Epithelial / LPF 0-5 0 - 5   Mucus PRESENT     Comment: Performed at [facility], [place] [address], [place], [place] [address]  Troponin I - Once     Status: Abnormal   Collection Time: 12/07/18  6:54 PM  Result Value Ref Range   Troponin I 0.13 (HH) <0.03 ng/mL    Comment: CRITICAL VALUE NOTED.  VALUE IS CONSISTENT WITH PREVIOUSLY REPORTED AND CALLED VALUE. Performed at [facility], [place] [address], [place], [place] [address]   Lactic acid, plasma     Status: None   Collection Time: 12/07/18  7:30 PM  Result Value Ref Range   Lactic Acid, Venous 1.1 0.5 - 1.9 mmol/L    Comment: Performed at [facility], [place] [address] [place], [place] [address]  Blood gas, venous     Status: Abnormal   Collection Time: 12/08/18  1:00 AM  Result Value Ref Range   pH, Ven 7.300 7.250 - 7.430   pCO2, Ven 74.5 (HH) 44.0 - 60.0 mmHg    Comment: CRITICAL RESULT CALLED TO, READ BACK BY AND VERIFIED WITH: Natasha Bence, RN AT [place] RRT, RCP ON 12/08/2018    pO2, Ven  32.0 - 45.0 mmHg    CRITICAL RESULT CALLED TO, READ BACK BY AND VERIFIED WITH:    Comment: BELOW REPORTABLE RANGE   Bicarbonate 35.5 (H) 20.0 - 28.0 mmol/L   Acid-Base Excess 6.6 (H) 0.0 - 2.0 mmol/L   O2 Saturation 46.8 %   Patient temperature 98.6    Collection site VEIN    Drawn by DRAWN BY RN    Sample type VENOUS     Comment: Performed at [facility], [place] [address], [place], [place]  [address]  Strep pneumoniae urinary antigen     Status: None   Collection Time: 12/08/18  3:17 AM  Result Value Ref Range   Strep Pneumo Urinary Antigen NEGATIVE NEGATIVE    Comment:        Infection due to S. pneumoniae cannot be absolutely ruled out since the antigen present may be below the  detection limit of the test. Performed at [facility], [address] [place], [place] [address]   Troponin I - Now Then Q6H     Status: Abnormal   Collection Time: 12/08/18  3:17 AM  Result Value Ref Range   Troponin I 0.08 (HH) <0.03 ng/mL    Comment: CRITICAL VALUE NOTED.  VALUE IS CONSISTENT WITH PREVIOUSLY REPORTED AND CALLED VALUE. Performed at [facility], [place] [address], [place], [place] [address]   HIV antibody (Routine Screening)     Status: None   Collection Time: 12/08/18  5:00 AM  Result Value Ref Range   HIV Screen 4th Generation wRfx Non Reactive Non Reactive    Comment: (NOTE) Performed At: [facility] [address] [place], [place] [id number] Rush Farmer MD [id number]   Culture, sputum-assessment     Status: None   Collection Time: 12/08/18  5:00 AM  Result Value Ref Range   Specimen Description SPUTUM    Special Requests Normal  Sputum evaluation      THIS SPECIMEN IS ACCEPTABLE FOR SPUTUM CULTURE Performed at [place] [address] [place] [address], [place], [place] [address]    Report Status 12/08/2018 FINAL   Magnesium     Status: None   Collection Time: 12/08/18  5:00 AM  Result Value Ref Range   Magnesium 2.2 1.7 - 2.4 mg/dL    Comment: Performed at [facility], [place] [address], [place], [place] [address]  Phosphorus     Status: None   Collection Time: 12/08/18  5:00 AM  Result Value Ref Range   Phosphorus 3.4 2.5 - 4.6 mg/dL    Comment: Performed at [facility], [place] [address] [place], [place] [address]  TSH     Status: None   Collection Time: 12/08/18  5:00 AM  Result Value Ref Range   TSH 2.168 0.350 - 4.500 uIU/mL    Comment: Performed at [facility], [place] [address] [place], [place] [address]  Comprehensive metabolic panel     Status: Abnormal   Collection Time: 12/08/18  5:00 AM  Result Value Ref Range   Sodium 138 135 - 145  mmol/L   Potassium 4.6 3.5 - 5.1 mmol/L   Chloride 99 98 - 111 mmol/L   CO2 33 (H) 22 - 32 mmol/L   Glucose, Bld 122 (H) 70 - 99 mg/dL   BUN 17 6 - 20 mg/dL   Creatinine, Ser 0.88 0.61 - 1.24 mg/dL   Calcium 8.3 (L) 8.9 - 10.3 mg/dL   Total Protein 7.0 6.5 - 8.1 g/dL   Albumin 3.2 (L) 3.5 - 5.0 g/dL   AST 26 15 - 41 U/L   ALT 24 0 - 44 U/L   Alkaline Phosphatase 64 38 - 126 U/L   Total Bilirubin 0.3 0.3 - 1.2 mg/dL   GFR calc non Af Amer >60 >60 mL/min   GFR calc Af Amer >60 >60 mL/min   Anion gap 6 5 - 15    Comment: Performed at [facility], [place] [address] [place], [place] [address]  CBC     Status: Abnormal   Collection Time: 12/08/18  5:00 AM  Result Value Ref Range   WBC 9.8 4.0 - 10.5 K/uL   RBC 5.00 4.22 - 5.81 MIL/uL   Hemoglobin 13.0 13.0 - 17.0 g/dL   HCT 44.5 39.0 - 52.0 %   MCV 89.0 80.0 - 100.0 fL   MCH 26.0 26.0 - 34.0 pg   MCHC 29.2 (L) 30.0 - 36.0 g/dL   RDW 16.5 (H) 11.5 - 15.5 %   Platelets 239 150 - 400 K/uL   nRBC 0.0 0.0 - 0.2 %    Comment: Performed at [facility], [place] [address] [place], [place] [address]  Culture, respiratory     Status: None (Preliminary result)   Collection Time: 12/08/18  5:00 AM  Result Value Ref Range   Specimen Description      SPUTUM Performed at [facility], [place] [address] [place], [place] [address]    Special Requests      Normal Reflexed from [phone] Performed at [facility], [place] [address] [place], [place] [address]    Gram Stain      FEW WBC PRESENT, PREDOMINANTLY PMN FEW GRAM POSITIVE RODS FEW GRAM POSITIVE COCCI IN CLUSTERS FEW GRAM NEGATIVE RODS Performed at [facility], [place] [address], [place], [place] [address]  Culture PENDING    Report Status PENDING   Troponin I - Now Then Q6H     Status: Abnormal   Collection Time: 12/08/18 11:00 AM  Result Value Ref Range   Troponin I 0.06 (HH) <0.03 ng/mL    Comment:  CRITICAL VALUE NOTED.  VALUE IS CONSISTENT WITH PREVIOUSLY REPORTED AND CALLED VALUE. Performed at [facility], [place] [address] [place], [place] [address]    Dg Chest 2 View  Result Date: 12/07/2018 CLINICAL DATA:  Cough, shortness of breath, and wheezing. EXAM: CHEST - 2 VIEW COMPARISON:  Chest x-ray dated Mar 24, 2018. FINDINGS: Stable mild cardiomegaly. Normal pulmonary vascularity. Increased patchy infiltrates in the left greater than right lower lobes. No pleural effusion or pneumothorax. No acute osseous abnormality. IMPRESSION: 1. Bilateral lower lobe pneumonia. This could also reflect aspiration given history of vomiting. Electronically Signed   By: Titus Dubin M.D.   On: 12/07/2018 17:12   EKG Interpretation  Date/Time:  Thursday December 08 2018 03:34:49 EST Ventricular Rate:  85 PR Interval:    QRS Duration: 105 QT Interval:  357 QTC Calculation: 425 R Axis:   -94 Text Interpretation:  Sinus rhythm Probable right ventricular hypertrophy Confirmed by Randal Buba, April ([id number]) on 12/08/2018 3:38:06 AM Also confirmed by Randal Buba, April ([id number]), editor Lynder Parents [phone])  on 12/08/2018 11:53:29 AM   Pending Labs Unresulted Labs (From admission, onward)    Start     Ordered   12/08/18 0316  Culture, blood (routine x 2) Call MD if unable to obtain prior to antibiotics being given  BLOOD CULTURE X 2,   R    Comments:  If blood cultures drawn in Emergency Department - Do not draw and cancel order   Question:  Patient immune status  Answer:  Normal   12/08/18 0316   12/08/18 0316  Troponin I - Now Then Q6H  Now then every 6 hours,   R     12/08/18 0316   12/07/18 1625  Urine culture  ONCE - STAT,   STAT     12/07/18 1625          Vitals/Pain Today's Vitals   12/08/18 1702 12/08/18 1730 12/08/18 1737 12/08/18 1808  BP: 129/77 127/65 127/65 114/61  Pulse: 81 70 77 72  Resp: (!) 21 20 15 19   Temp:      TempSrc:      SpO2: 91% 91% 93% 92%  Weight:       Height:      PainSc:        Isolation Precautions Droplet precaution  Medications Medications  ondansetron (ZOFRAN) injection 4 mg (4 mg Intravenous Given 12/07/18 1756)  cefTRIAXone (ROCEPHIN) 1 g in sodium chloride 0.9 % 100 mL IVPB (0 g Intravenous Stopped 12/07/18 1915)  doxycycline (VIBRA-TABS) tablet 100 mg (100 mg Oral Given 12/08/18 1008)  0.9 %  sodium chloride infusion ( Intravenous Stopped 12/08/18 0336)  oseltamivir (TAMIFLU) capsule 75 mg (75 mg Oral Given 12/08/18 1009)  aspirin EC tablet 81 mg (81 mg Oral Given 12/08/18 1008)  tamsulosin (FLOMAX) capsule 0.4 mg (0.4 mg Oral Given 12/08/18 1008)  acetaminophen (TYLENOL) tablet 650 mg (has no administration in time range)    Or  acetaminophen (TYLENOL) suppository 650 mg (has no administration in time range)  ondansetron (ZOFRAN) tablet 4 mg (has no administration in time range)    Or  ondansetron (ZOFRAN) injection 4 mg (has no administration in time range)  predniSONE (DELTASONE) tablet 40 mg (  40 mg Oral Given 12/08/18 0857)  ipratropium-albuterol (DUONEB) 0.5-2.5 (3) MG/3ML nebulizer solution 3 mL (3 mLs Nebulization Not Given 12/08/18 1755)  enoxaparin (LOVENOX) injection 60 mg (60 mg Subcutaneous Given 12/08/18 1008)  0.9 %  sodium chloride infusion ( Intravenous New Bag/Given 12/08/18 0506)  guaiFENesin (MUCINEX) 12 hr tablet 600 mg (600 mg Oral Given 12/08/18 1008)  albuterol (PROVENTIL) (2.5 MG/3ML) 0.083% nebulizer solution 2.5 mg (has no administration in time range)  albuterol (PROVENTIL) (2.5 MG/3ML) 0.083% nebulizer solution (  Not Given 12/08/18 0404)  ipratropium-albuterol (DUONEB) 0.5-2.5 (3) MG/3ML nebulizer solution 3 mL (3 mLs Nebulization Given 12/07/18 1742)  albuterol (PROVENTIL) (2.5 MG/3ML) 0.083% nebulizer solution 2.5 mg (2.5 mg Nebulization Given 12/07/18 1742)  ipratropium-albuterol (DUONEB) 0.5-2.5 (3) MG/3ML nebulizer solution 3 mL (3 mLs Nebulization Given 12/07/18 1933)  albuterol (PROVENTIL) (2.5  MG/3ML) 0.083% nebulizer solution 2.5 mg (2.5 mg Nebulization Given 12/07/18 1933)    Mobility walks

## 2018-12-08 NOTE — Evaluation (Addendum)
Physical Therapy Evaluation Patient Details Name: [name] [name] MRN: [id number] DOB: [birth year] Today's Date: 12/08/2018   History of Present Illness  58 y.o. male with medical history significant of recently, tobacco abuse, sleep apnea, and presented to ED for CAP, flu A positive  Clinical Impression  Pt admitted with above diagnosis. Pt currently with functional limitations due to the deficits listed below (see PT Problem List).  Pt will benefit from skilled PT to increase their independence and safety with mobility to allow discharge to the venue listed below.  Pt eager to start mobilizing so assisted with ambulating in hallway (with mask) and remained on 6L O2 [place] (see details in mobility section).  Pt does not wear oxygen at home at baseline.  Pt reports his spouse and son also have the flu at home.     Follow Up Recommendations No PT follow up    Equipment Recommendations  None recommended by PT    Recommendations for Other Services       Precautions / Restrictions Precautions Precautions: Fall Precaution Comments: monitor sats      Mobility  Bed Mobility Overal bed mobility: Needs Assistance Bed Mobility: Supine to Sit;Sit to Supine     Supine to sit: Supervision Sit to supine: Supervision      Transfers Overall transfer level: Needs assistance Equipment used: None Transfers: Sit to/from Stand Sit to Stand: Min guard         General transfer comment: min/guard for safety  Ambulation/Gait Ambulation/Gait assistance: Min guard Gait Distance (Feet): 120 Feet Assistive device: None Gait Pattern/deviations: Step-through pattern;Decreased stride length     General Gait Details: pt reports slight dizziness which remained constant, SPo2 91-95% on 6L O2  during ambulation    BP upon returning to room: 118/85 mmHg  Stairs            Wheelchair Mobility    Modified Rankin (Stroke Patients Only)       Balance Overall balance assessment: No  apparent balance deficits (not formally assessed)                                           Pertinent Vitals/Pain Pain Assessment: No/denies pain    Home Living Family/patient expects to be discharged to:: Private residence Living Arrangements: Spouse/significant other Available Help at Discharge: Family Type of Home: House       Home Layout: One level Home Equipment: None      Prior Function Level of Independence: Independent               Hand Dominance        Extremity/Trunk Assessment        Lower Extremity Assessment Lower Extremity Assessment: Generalized weakness       Communication   Communication: No difficulties  Cognition Arousal/Alertness: Awake/alert Behavior During Therapy: WFL for tasks assessed/performed Overall Cognitive Status: Within Functional Limits for tasks assessed                                        General Comments      Exercises     Assessment/Plan    PT Assessment Patient needs continued PT services  PT Problem List Decreased strength;Decreased activity tolerance;Cardiopulmonary status limiting activity;Decreased mobility       PT Treatment Interventions DME  instruction;Functional mobility training;Balance training;Gait training;Therapeutic activities;Neuromuscular re-education;Patient/family education;Stair training;Therapeutic exercise    PT Goals (Current goals can be found in the Care Plan section)  Acute Rehab PT Goals PT Goal Formulation: With patient Time For Goal Achievement: 12/15/18 Potential to Achieve Goals: Good    Frequency Min 3X/week   Barriers to discharge        Co-evaluation               AM-PAC PT "6 Clicks" Mobility  Outcome Measure Help needed turning from your back to your side while in a flat bed without using bedrails?: None Help needed moving from lying on your back to sitting on the side of a flat bed without using bedrails?: None Help  needed moving to and from a bed to a chair (including a wheelchair)?: A Little Help needed standing up from a chair using your arms (e.g., wheelchair or bedside chair)?: A Little Help needed to walk in hospital room?: A Little Help needed climbing 3-5 steps with a railing? : A Little 6 Click Score: 20    End of Session Equipment Utilized During Treatment: Oxygen Activity Tolerance: Patient tolerated treatment well Patient left: in bed;with call bell/phone within reach Nurse Communication: Mobility status PT Visit Diagnosis: Difficulty in walking, not elsewhere classified (R26.2)    Time: 0301-3143 PT Time Calculation (min) (ACUTE ONLY): 15 min   Charges:   PT Evaluation $PT Eval Low Complexity: Burna, PT, DPT Acute Rehabilitation Services Office: [phone] Pager: [phone]   Trena Platt 12/08/2018, 4:28 PM

## 2018-12-09 ENCOUNTER — Inpatient Hospital Stay (HOSPITAL_COMMUNITY): Payer: BC Managed Care – PPO

## 2018-12-09 MED ORDER — IPRATROPIUM-ALBUTEROL 0.5-2.5 (3) MG/3ML IN SOLN
3.0000 mL | Freq: Three times a day (TID) | RESPIRATORY_TRACT | Status: DC
  Administered 2018-12-10 – 2018-12-12 (×7): 3 mL via RESPIRATORY_TRACT
  Filled 2018-12-09 (×8): qty 3

## 2018-12-09 MED ORDER — PRO-STAT SUGAR FREE PO LIQD
30.0000 mL | Freq: Every day | ORAL | Status: DC
  Administered 2018-12-10 – 2018-12-12 (×2): 30 mL via ORAL
  Filled 2018-12-09: qty 30

## 2018-12-09 NOTE — Evaluation (Signed)
Occupational Therapy Evaluation Patient Details Name: [name] [name] MRN: [id number] DOB: October 30, 1961 Today's Date: 12/09/2018    History of Present Illness 58 y.o. male with medical history significant of recently, tobacco abuse, sleep apnea, and presented to ED for CAP, flu A positive   Clinical Impression   This 58 yo male admitted with above presents to acute OT with lethargy but moving fairly well (minguard A level). Educated on energy conservation but due to lethargy need to reiterate this. Sats on 5 liters HFNC getting up to bathroom and back dropped from 93% to 87%, pt needed cues for pursed lipped breathing to get sats back up into 90's.     Follow Up Recommendations  No OT follow up    Equipment Recommendations  None recommended by OT        Precautions / Restrictions Precautions Precaution Comments: monitor sats (dropped to 87% on 5 liters HFNC when getting up to bathroom and back to bed) Restrictions Weight Bearing Restrictions: No      Mobility Bed Mobility Overal bed mobility: Independent                Transfers Overall transfer level: Needs assistance Equipment used: None Transfers: Sit to/from Stand Sit to Stand: Supervision              Balance Overall balance assessment: Mild deficits observed, not formally tested                                         ADL either performed or assessed with clinical judgement   ADL                                         General ADL Comments: min guard A due to lethargy and A with lines. Educated pt on energy conservation and provided handout with highlighted areas as well as education on purse lipped breathing.     Vision Patient Visual Report: No change from baseline              Pertinent Vitals/Pain Pain Assessment: No/denies pain     Hand Dominance Right   Extremity/Trunk Assessment Upper Extremity Assessment Upper Extremity Assessment: Overall WFL  for tasks assessed           Communication Communication Communication: No difficulties   Cognition Arousal/Alertness: Lethargic Behavior During Therapy: WFL for tasks assessed/performed Overall Cognitive Status: Within Functional Limits for tasks assessed                                 General Comments: Pt reports he did not sleep very well last night with trying to use a CPAP. Pt driftng in and out of sleep while I was talking to him with pt laying in bed. Once he sat up and I had him move around room he was more alert.              Home Living Family/patient expects to be discharged to:: Private residence Living Arrangements: Spouse/significant other Available Help at Discharge: Family Type of Home: House       Home Layout: One level     Bathroom Shower/Tub: Walk-in shower;Door   [employer] Toilet: Standard     Home Equipment: None  Prior Functioning/Environment Level of Independence: Independent                 OT Problem List: Cardiopulmonary status limiting activity         OT Goals(Current goals can be found in the care plan section) Acute Rehab OT Goals Patient Stated Goal: to get to feeling better OT Goal Formulation: With patient Time For Goal Achievement: 12/23/18 Potential to Achieve Goals: Good  OT Frequency:                AM-PAC OT "6 Clicks" Daily Activity     Outcome Measure Help from another person eating meals?: None Help from another person taking care of personal grooming?: A Little(S due to lethargy) Help from another person toileting, which includes using toliet, bedpan, or urinal?: A Little(S due to lethargy) Help from another person bathing (including washing, rinsing, drying)?: A Little(S due to lethargy) Help from another person to put on and taking off regular upper body clothing?: A Little(S due to lethargy) Help from another person to put on and taking off regular lower body clothing?: A  Little(S due to lethargy) 6 Click Score: 19   End of Session Equipment Utilized During Treatment: Oxygen(5 liters HFNC)  Activity Tolerance: Patient tolerated treatment well Patient left: in bed;with call bell/phone within reach  OT Visit Diagnosis: Unsteadiness on feet (R26.81)                Time: 0902-0920 OT Time Calculation (min): 18 min Charges:  OT General Charges $OT Visit: 1 Visit OT Evaluation $OT Eval Moderate Complexity: 1 Mod  [name] [name], OTR/L Acute [employer] Pager [phone] Office [phone]     [name] [name] 12/09/2018, 12:22 PM

## 2018-12-09 NOTE — Progress Notes (Signed)
PROGRESS NOTE    [name] [name]  NIO:[id number] DOB: June 04, 1961 DOA: 12/07/2018 PCP: Tanda Rockers, MD   Brief Narrative: 58 y.o.malewith medical history significant of recently, tobacco abuse, sleep apnea, currently disc disease   Presented with6 day hx of fever, cough Patient had mild symptoms for the past 1 week positive for runny nose cough headaches cough has been nonproductive although sometimes he coughs up a little bit of clear sputum he has had some wheezing he does have prescription for albuterol but that has expired. Recently has been seen in urgent care was diagnosed with pneumonia started on a Z-Pak has been taken for the past 3 days and has had some nausea and vomiting developed left-sided take antibiotics. Was seen in Dr. Leonides Schanz office today who has been acting as his primary care provider and was noted to have oxygen saturation down to 70s on room air was started on oxygen and went up to 92 on 4 L  Reportssat next to someone at church who had the flu. Did not take a flu shot this year. Fevers at home Reports chest pain only when he coughs in his ribs. Denies any dysuria  Assessment & Plan:   Principal Problem:   CAP (community acquired pneumonia) Active Problems:   Cigarette smoker   Elevated troponin   Acute respiratory failure with hypoxia (HCC)   Tobacco abuse   Influenza A   Reactive airway disease with wheezing with acute exacerbation   Pyuria  #1 acute hypoxic respiratory failure secondary to community-acquired pneumonia, COPD, influenza A-patient presented with complaints of fever cough and shortness of breath.  He was found to have bibasilar pneumonia and influenza A.  Continue doxycycline Rocephin and Tamiflu.  Continue nebulizer treatments.  Titrate oxygen to keep sats above 92%.  Reports that he dropped his saturation while sleeping.  Patient has history of sleep apnea.  I was able to wake him up easily today and follow conversation with  me.  #2 pleuritic chest pain with elevated troponin peaked at 0.18 with no acute EKG changes probably demand ischemia monitor closely. Follow-up echocardiogram.  Ejection fraction 60 to 65%.  #3 sleep apnea with obesity wife reports that he was supposed to have a sleep study done tomorrow and follow-up with pulmonary.  #4 BPH with pyuria patient asymptomatic from urology standpoint.  Continue Flomax.    Nutrition Problem: Increased nutrient needs Etiology: acute illness     Signs/Symptoms: estimated needs    Interventions: Refer to RD note for recommendations  Estimated body mass index is 39.33 kg/m as calculated from the following:   Height as of this encounter: 5\' 11"  (1.803 m).   Weight as of this encounter: 127.9 kg.  DVT prophylaxis: Lovenox Code Status: Full code Family Communication: No family present Disposition Plan: Pending clinical improvement  Consultants: None  Procedures: None Antimicrobials doxycycline and Rocephin and Tamiflu  Subjective: Resting in bed able to wake up easily reports he did not sleep last night breathing seems to be better still still dependent on over 5 L of oxygen did not have oxygen at home  Objective: Vitals:   12/09/18 0227 12/09/18 0537 12/09/18 0933 12/09/18 1113  BP:  124/60  107/63  Pulse:  73  72  Resp:  18  16  Temp:  99.1 F (37.3 C)  98.7 F (37.1 C)  TempSrc:  Oral  Oral  SpO2: 98% 95% 90% 93%  Weight:      Height:  Intake/Output Summary (Last 24 hours) at 12/09/2018 1324 Last data filed at 12/09/2018 0600 Gross per 24 hour  Intake 1692.27 ml  Output -  Net 1692.27 ml   Filed Weights   12/07/18 1603  Weight: 127.9 kg    Examination:  General exam: Appears calm and comfortable  Respiratory system: Scattered rhonchi bilaterally to auscultation. Respiratory effort normal. Cardiovascular system: S1 & S2 heard, RRR. No JVD, murmurs, rubs, gallops or clicks. No pedal edema. Gastrointestinal  system: Abdomen is nondistended, soft and nontender. No organomegaly or masses felt. Normal bowel sounds heard. Central nervous system: Alert and oriented. No focal neurological deficits. Extremities: Symmetric 5 x 5 power. Skin: No rashes, lesions or ulcers Psychiatry: Judgement and insight appear normal. Mood & affect appropriate.     Data Reviewed: I have personally reviewed following labs and imaging studies  CBC: Recent Labs  Lab 12/07/18 1615 12/08/18 0500  WBC 9.5 9.8  NEUTROABS 7.0  --   HGB 14.0 13.0  HCT 46.9 44.5  MCV 90.4 89.0  PLT 222 169   Basic Metabolic Panel: Recent Labs  Lab 12/07/18 1615 12/08/18 0500  NA 136 138  K 4.1 4.6  CL 97* 99  CO2 31 33*  GLUCOSE 121* 122*  BUN 21* 17  CREATININE 1.04 0.88  CALCIUM 8.6* 8.3*  MG  --  2.2  PHOS  --  3.4   GFR: Estimated Creatinine Clearance: 123.1 mL/min (by C-G formula based on SCr of 0.88 mg/dL). Liver Function Tests: Recent Labs  Lab 12/07/18 1615 12/08/18 0500  AST 35 26  ALT 28 24  ALKPHOS 70 64  BILITOT 0.3 0.3  PROT 7.5 7.0  ALBUMIN 3.6 3.2*   Recent Labs  Lab 12/07/18 1615  LIPASE 32   No results for input(s): AMMONIA in the last 168 hours. Coagulation Profile: No results for input(s): INR, PROTIME in the last 168 hours. Cardiac Enzymes: Recent Labs  Lab 12/07/18 1615 12/07/18 1854 12/08/18 0317 12/08/18 1100 12/08/18 1723  TROPONINI 0.18* 0.13* 0.08* 0.06* 0.06*   BNP (last 3 results) No results for input(s): PROBNP in the last 8760 hours. HbA1C: No results for input(s): HGBA1C in the last 72 hours. CBG: No results for input(s): GLUCAP in the last 168 hours. Lipid Profile: No results for input(s): CHOL, HDL, LDLCALC, TRIG, CHOLHDL, LDLDIRECT in the last 72 hours. Thyroid Function Tests: Recent Labs    12/08/18 0500  TSH 2.168   Anemia Panel: No results for input(s): VITAMINB12, FOLATE, FERRITIN, TIBC, IRON, RETICCTPCT in the last 72 hours. Sepsis Labs: Recent  Labs  Lab 12/07/18 1730 12/07/18 1930  LATICACIDVEN 0.9 1.1    Recent Results (from the past 240 hour(s))  Urine culture     Status: Abnormal (Preliminary result)   Collection Time: 12/07/18  5:46 PM  Result Value Ref Range Status   Specimen Description   Final    URINE, RANDOM Performed at [facility] [address] [place], [place] [address]    Special Requests   Final    NONE Performed at [facility], [place] [address] [place], [name] [address]    Culture >=100,000 COLONIES/mL ESCHERICHIA COLI (A)  Final   Report Status PENDING  Incomplete  Culture, sputum-assessment     Status: None   Collection Time: 12/08/18  5:00 AM  Result Value Ref Range Status   Specimen Description SPUTUM  Final   Special Requests Normal  Final   Sputum evaluation   Final  THIS SPECIMEN IS ACCEPTABLE FOR SPUTUM CULTURE Performed at [facility], [place] [address], [place], [place] [address]    Report Status 12/08/2018 FINAL  Final  Culture, respiratory     Status: None (Preliminary result)   Collection Time: 12/08/18  5:00 AM  Result Value Ref Range Status   Specimen Description   Final    SPUTUM Performed at [facility] [address], [place], [place] [address]    Special Requests   Final    Normal Reflexed from [id number] Performed at [facility], [place] [address], [place], [place] [address]    Gram Stain   Final    FEW WBC PRESENT, PREDOMINANTLY PMN FEW GRAM POSITIVE RODS FEW GRAM POSITIVE COCCI IN CLUSTERS FEW GRAM NEGATIVE RODS Performed at [facility], [place] [address], [place], [place] [address]    Culture FEW Consistent with normal respiratory flora.   Final   Report Status PENDING  Incomplete  Culture, blood (routine x 2) Call MD if unable to obtain prior to antibiotics being given     Status: None (Preliminary result)   Collection Time: 12/08/18  5:10 AM  Result Value Ref  Range Status   Specimen Description   Final    BLOOD RIGHT ANTECUBITAL Performed at [facility] [address] [place], [place] [address]    Special Requests   Final    BOTTLES DRAWN AEROBIC AND ANAEROBIC Blood Culture adequate volume Performed at [facility] [address] [place], [place] [address]    Culture   Final    NO GROWTH 1 DAY Performed at [facility], [place] [address], [place], [place] [address]    Report Status PENDING  Incomplete  Culture, blood (routine x 2) Call MD if unable to obtain prior to antibiotics being given     Status: None (Preliminary result)   Collection Time: 12/08/18  5:12 AM  Result Value Ref Range Status   Specimen Description   Final    BLOOD LEFT ANTECUBITAL Performed at [facility] [address], [place], [place] [address]    Special Requests   Final    BOTTLES DRAWN AEROBIC AND ANAEROBIC Blood Culture adequate volume Performed at [facility] [address] [place], [place] [address]    Culture   Final    NO GROWTH 1 DAY Performed at [facility], [place] [address], [place],  [address]    Report Status PENDING  Incomplete         Radiology Studies: Dg Chest 2 View  Result Date: 12/07/2018 CLINICAL DATA:  Cough, shortness of breath, and wheezing. EXAM: CHEST - 2 VIEW COMPARISON:  Chest x-ray dated Mar 24, 2018. FINDINGS: Stable mild cardiomegaly. Normal pulmonary vascularity. Increased patchy infiltrates in the left greater than right lower lobes. No pleural effusion or pneumothorax. No acute osseous abnormality. IMPRESSION: 1. Bilateral lower lobe pneumonia. This could also reflect aspiration given history of vomiting. Electronically Signed   By: Titus Dubin M.D.   On: 12/07/2018 17:12   Dg Chest Port 1 View  Result Date: 12/09/2018 CLINICAL DATA:  Shortness of breath.  Flu. EXAM: PORTABLE CHEST 1 VIEW COMPARISON:  12/07/2018.  03/24/2018. Ultrasound thyroid 06/28/2012. 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 Stable cardiomegaly. Interim slight progression of bibasilar pulmonary infiltrates. New infiltrate right upper lobe. Small bilateral pleural effusions. Findings are  again consistent with pneumonia. CHF can not be excluded. No pneumothorax. IMPRESSION: 1. Interim slight progression bibasilar pulmonary infiltrates. New infiltrate right upper lobe. Small bilateral pleural effusions. Findings are again consistent with pneumonia. CHF can not be excluded. 2. Displacement the trachea to the right is again noted consistent patient's known multinodular goiter. Electronically Signed   By: Marcello Moores  Register   On: 12/09/2018 05:20        Scheduled Meds: . aspirin EC  81 mg Oral Daily  . doxycycline  100 mg Oral Q12H  . enoxaparin (LOVENOX) injection  60 mg Subcutaneous Q24H  . feeding supplement (PRO-STAT SUGAR FREE 64)  30 mL Oral Daily  . guaiFENesin  600 mg Oral BID  . ipratropium-albuterol  3 mL Nebulization Q6H  . oseltamivir  75 mg Oral BID  . predniSONE  40 mg Oral Q breakfast  . tamsulosin  0.4 mg Oral Daily   Continuous Infusions: . sodium chloride 100 mL/hr at 12/09/18 0756  . cefTRIAXone (ROCEPHIN)  IV 1 g (12/08/18 2107)     LOS: 2 days     Georgette Shell, MD [facility]  If 7PM-7AM, please contact night-coverage [email or web address] Password [email or web address] 12/09/2018, 1:24 PM

## 2018-12-09 NOTE — Progress Notes (Signed)
Physical Therapy Treatment Patient Details Name: [name] [name] MRN: [id number] DOB: [birth year] Today's Date: 12/09/2018    History of Present Illness 58 y.o. male with medical history significant of recently, tobacco abuse, sleep apnea, and presented to ED for CAP, flu A positive    PT Comments    Progressing with mobility. O2 sat 90% on 4L [place] at rest, 95% on 6L [place] during ambulation. Encouraged pt to walk in hallway at least 1x day with nursing staff. Will continue to follow.    Follow Up Recommendations  No PT follow up     Equipment Recommendations  None recommended by PT    Recommendations for Other Services       Precautions / Restrictions Precautions Precautions: Fall Precaution Comments: monitor sats  Restrictions Weight Bearing Restrictions: No    Mobility  Bed Mobility Overal bed mobility: Independent                Transfers Overall transfer level: Needs assistance   Transfers: Sit to/from Stand Sit to Stand: Supervision         General transfer comment: for safety, line/equipment management  Ambulation/Gait Ambulation/Gait assistance: Min guard Gait Distance (Feet): 150 Feet Assistive device: None Gait Pattern/deviations: Step-through pattern     General Gait Details: pt continues to c/o lightheadedness. slow gait speed. Mild unsteadiness but no LOB. O2 sat 95% on 6L [place].    Stairs             Wheelchair Mobility    Modified Rankin (Stroke Patients Only)       Balance Overall balance assessment: Mild deficits observed, not formally tested                                          Cognition Arousal/Alertness: Awake/alert Behavior During Therapy: WFL for tasks assessed/performed Overall Cognitive Status: Within Functional Limits for tasks assessed                                        Exercises      General Comments        Pertinent Vitals/Pain Pain Assessment: No/denies pain     Home Living                      Prior Function            PT Goals (current goals can now be found in the care plan section) Acute Rehab PT Goals Patient Stated Goal: to get to feeling better Progress towards PT goals: Progressing toward goals    Frequency    Min 3X/week      PT Plan Current plan remains appropriate    Co-evaluation              AM-PAC PT "6 Clicks" Mobility   Outcome Measure  Help needed turning from your back to your side while in a flat bed without using bedrails?: None Help needed moving from lying on your back to sitting on the side of a flat bed without using bedrails?: None Help needed moving to and from a bed to a chair (including a wheelchair)?: A Little Help needed standing up from a chair using your arms (e.g., wheelchair or bedside chair)?: A Little Help needed to walk in hospital room?: A Little  Help needed climbing 3-5 steps with a railing? : A Little 6 Click Score: 20    End of Session Equipment Utilized During Treatment: Oxygen Activity Tolerance: Patient tolerated treatment well Patient left: in bed;with call bell/phone within reach;with family/visitor present   PT Visit Diagnosis: Difficulty in walking, not elsewhere classified (R26.2)     Time: 5041-3643 PT Time Calculation (min) (ACUTE ONLY): 31 min  Charges:  $Gait Training: 23-37 mins                        Weston Anna, [place] Pager: [phone] Office: [phone]

## 2018-12-09 NOTE — Procedures (Signed)
Pt. Does not want to wear CPAP device this shift.  States that it bothered him all night last night and he did not sleep well.  Will be available if patient changes his mind.

## 2018-12-09 NOTE — Progress Notes (Signed)
Pt. started on CPAP earlier in shift with Auto titration with low of 6 as ramp, with high of 20 cmh20 with 6 lpm 02 added to circuit, staff/RT have gradually increased oxygen setting to keep sats >92% per protocol, RT placed pt. to CPAP setting of 16 after mask became loose and was leaking with auto pressure at 14 cmh20,  placed setting at 16 cmh20 with oxygen at 15 lpm to keep sats >/=92%, pt. is scheduled soon for a titration study where this is done optimally, pt. remains alert and is tolerating mask but will benefit from scheduled sleep study to properly be fitted for mask and pressure required.

## 2018-12-09 NOTE — Progress Notes (Signed)
Initial Nutrition Assessment  DOCUMENTATION CODES:   Obesity unspecified  INTERVENTION:  - Will order 30 mL Prostat once/day, each supplement provides 100 kcal and 15 grams of protein. - Continue to encourage PO intakes.    NUTRITION DIAGNOSIS:   Increased nutrient needs related to acute illness as evidenced by estimated needs.  GOAL:   Patient will meet greater than or equal to 90% of their needs  MONITOR:   PO intake, Weight trends, Labs, I & O's  REASON FOR ASSESSMENT:   Malnutrition Screening Tool, Consult Assessment of nutrition requirement/status  ASSESSMENT:   58 y.o. male with medical history significant of tobacco abuse, sleep apnea, and disc disease. Patient presented to the ED with 6 day hx of fever, cough, runny nose, and headache. He was recently seen at urgent care and was diagnosed with pneumonia and started on a Z-Pak. He took this x3 days and developed N/V which he felt was a side effect of antibiotic.  No intakes documented since admission. Patient reports decreased appetite x1 week d/t overall not feeling well. He also reports having N/V while taking Z-pak but that he is no longer experiencing these symptoms. Patient reports that prior to that time he had a very good appetite.   Per Dr. Gustavus Bryant note on 1/29: no improvement in his weight despite previous extensive counseling regarding calorie balance issues.  Per chart review, current weight is 282 lb and weight has been mainly stable since 1/025/17. Weight on 07/15/18 was 297 lb, but this was an outlier compared to other weights over the past 27 months.    Medications reviewed; 40 mg deltasone/day.  Labs reviewed; Ca: 8.3 mg/dl. IVF; NS @ 100 ml/hr.      NUTRITION - FOCUSED PHYSICAL EXAM:  Completed; no muscle and no fat wasting.   Diet Order:   Diet Order            Diet Heart Room service appropriate? Yes; Fluid consistency: Thin  Diet effective now              EDUCATION NEEDS:   No  education needs have been identified at this time  Skin:  Skin Assessment: Reviewed RN Assessment  Last BM:  1/27 (PTA)  Height:   Ht Readings from Last 1 Encounters:  12/07/18 5\' 11"  (1.803 m)    Weight:   Wt Readings from Last 1 Encounters:  12/07/18 127.9 kg    Ideal Body Weight:  78.18 kg  BMI:  Body mass index is 39.33 kg/m.  Estimated Nutritional Needs:   Kcal:  1800-2000 kcal  Protein:  100-115 grams  Fluid:  > 1.8 L/day      Jarome Matin, MS, RD, LDN, [facility] Inpatient Clinical Dietitian Pager # [phone] After hours/weekend pager # [phone]

## 2018-12-09 NOTE — Progress Notes (Signed)
At the beginning of shift, pt O2 saturation was between 86-90% on 6L [place]. Pt stated that he had worn a CPAP in the past and has an appointment for a sleep study to obtain another CPAP. NP on call notified and order placed for CPAP at night. Pt had a difficult time wearing the mask and stated that it was uncomfortable. This RN placed a high-flow [place] at 8L. Pt O2 saturation remained at 90-96% after high-flow [place] placed.

## 2018-12-10 ENCOUNTER — Inpatient Hospital Stay (HOSPITAL_COMMUNITY): Payer: BC Managed Care – PPO

## 2018-12-10 LAB — CULTURE, RESPIRATORY

## 2018-12-10 LAB — CULTURE, RESPIRATORY W GRAM STAIN
Culture: NORMAL
Special Requests: NORMAL

## 2018-12-10 LAB — URINE CULTURE: Culture: >=100000 — AB

## 2018-12-10 NOTE — Progress Notes (Signed)
Offered CPAP to patient and refuses at this time.  Patient states that he cant take on something new like that at this time.  Educated on usage and requested patient call if he changes his mind.

## 2018-12-10 NOTE — Progress Notes (Signed)
PROGRESS NOTE    [name] [name]  DUK:[id number] DOB: Sep 10, 1961 DOA: 12/07/2018 PCP: Tanda Rockers, MD  Brief Narrative:58 y.o.malewith medical history significant of recently, tobacco abuse, sleep apnea, currently disc disease   Presented with6 day hx of fever, cough Patient had mild symptoms for the past 1 week positive for runny nose cough headaches cough has been nonproductive although sometimes he coughs up a little bit of clear sputum he has had some wheezing he does have prescription for albuterol but that has expired. Recently has been seen in urgent care was diagnosed with pneumonia started on a Z-Pak has been taken for the past 3 days and has had some nausea and vomiting developed left-sided take antibiotics. Was seen in Dr. Leonides Schanz office today who has been acting as his primary care provider and was noted to have oxygen saturation down to 70s on room air was started on oxygen and went up to 92 on 4 L  Reportssat next to someone at church who had the flu. Did not take a flu shot this year. Fevers at home Reports chest pain only when he coughs in his ribs. Assessment & Plan:   Principal Problem:   CAP (community acquired pneumonia) Active Problems:   Cigarette smoker   Elevated troponin   Acute respiratory failure with hypoxia (HCC)   Tobacco abuse   Influenza A   Reactive airway disease with wheezing with acute exacerbation   Pyuria   #1 acute hypoxic respiratory failure secondary to community-acquired pneumonia, COPD, influenza A-patient presented with complaints of fever cough and shortness of breath. He was found to have bibasilar pneumonia and influenza A. Continue doxycycline Rocephin and Tamiflu. Continue nebulizer treatments.  Titrate oxygen to keep sats above 92%.  Reports that he dropped his saturation while sleeping.  Patient has history of untreated sleep apnea.  Out of bed.  Chest x-ray follow-up labs in the morning.  Patient still dependent on 5 L  of oxygen.  #2 pleuritic chest pain with elevated troponin peaked at 0.18 with no acute EKG changes probably demand ischemia monitor closely.  echocardiogram.  Ejection fraction 60 to 65%.  #3 sleep apnea with obesity wife reports that he was supposed to have a sleep study done tomorrow and follow-up with pulmonary.  #4 BPH with pyuria patient asymptomatic from urology standpoint. Continue Flomax.     Nutrition Problem: Increased nutrient needs Etiology: acute illness     Signs/Symptoms: estimated needs    Interventions: Refer to RD note for recommendations  Estimated body mass index is 39.33 kg/m as calculated from the following:   Height as of this encounter: 5\' 11"  (1.803 m).   Weight as of this encounter: 127.9 kg.  DVT prophylaxis: Lovenox Code Status full code Family Communication: No family available Disposition Plan Pending clinical progress Consultants: None  Procedures: None Antimicrobials: Doxy and Rocephin Tamiflu  Subjective: Much more awake alert this morning talking on the phone with his wife feels the breathing is better cough is better however he still dependent on 5 L of oxygen   Objective: Vitals:   12/09/18 2147 12/10/18 0046 12/10/18 0605 12/10/18 0750  BP: (!) 108/54 (!) 148/77 (!) 144/84   Pulse: 61 64 63   Resp: 20 18 20    Temp: 99 F (37.2 C) 98.3 F (36.8 C) 97.6 F (36.4 C)   TempSrc: Oral Oral Oral   SpO2: 92% 91% 95% 93%  Weight:      Height:        Intake/Output  Summary (Last 24 hours) at 12/10/2018 1240 Last data filed at 12/09/2018 1500 Gross per 24 hour  Intake 742.63 ml  Output -  Net 742.63 ml   Filed Weights   12/07/18 1603  Weight: 127.9 kg    Examination:  General exam: Appears calm and comfortable  Respiratory system: Scattered rhonchi bilaterally to auscultation. Respiratory effort normal. Cardiovascular system: S1 & S2 heard, RRR. No JVD, murmurs, rubs, gallops or clicks. No pedal  edema. Gastrointestinal system: Abdomen is nondistended, soft and nontender. No organomegaly or masses felt. Normal bowel sounds heard. Central nervous system: Alert and oriented. No focal neurological deficits. Extremities: Symmetric 5 x 5 power. Skin: No rashes, lesions or ulcers Psychiatry: Judgement and insight appear normal. Mood & affect appropriate.     Data Reviewed: I have personally reviewed following labs and imaging studies  CBC: Recent Labs  Lab 12/07/18 1615 12/08/18 0500  WBC 9.5 9.8  NEUTROABS 7.0  --   HGB 14.0 13.0  HCT 46.9 44.5  MCV 90.4 89.0  PLT 222 786   Basic Metabolic Panel: Recent Labs  Lab 12/07/18 1615 12/08/18 0500  NA 136 138  K 4.1 4.6  CL 97* 99  CO2 31 33*  GLUCOSE 121* 122*  BUN 21* 17  CREATININE 1.04 0.88  CALCIUM 8.6* 8.3*  MG  --  2.2  PHOS  --  3.4   GFR: Estimated Creatinine Clearance: 123.1 mL/min (by C-G formula based on SCr of 0.88 mg/dL). Liver Function Tests: Recent Labs  Lab 12/07/18 1615 12/08/18 0500  AST 35 26  ALT 28 24  ALKPHOS 70 64  BILITOT 0.3 0.3  PROT 7.5 7.0  ALBUMIN 3.6 3.2*   Recent Labs  Lab 12/07/18 1615  LIPASE 32   No results for input(s): AMMONIA in the last 168 hours. Coagulation Profile: No results for input(s): INR, PROTIME in the last 168 hours. Cardiac Enzymes: Recent Labs  Lab 12/07/18 1615 12/07/18 1854 12/08/18 0317 12/08/18 1100 12/08/18 1723  TROPONINI 0.18* 0.13* 0.08* 0.06* 0.06*   BNP (last 3 results) No results for input(s): PROBNP in the last 8760 hours. HbA1C: No results for input(s): HGBA1C in the last 72 hours. CBG: No results for input(s): GLUCAP in the last 168 hours. Lipid Profile: No results for input(s): CHOL, HDL, LDLCALC, TRIG, CHOLHDL, LDLDIRECT in the last 72 hours. Thyroid Function Tests: Recent Labs    12/08/18 0500  TSH 2.168   Anemia Panel: No results for input(s): VITAMINB12, FOLATE, FERRITIN, TIBC, IRON, RETICCTPCT in the last 72  hours. Sepsis Labs: Recent Labs  Lab 12/07/18 1730 12/07/18 1930  LATICACIDVEN 0.9 1.1    Recent Results (from the past 240 hour(s))  Urine culture     Status: Abnormal   Collection Time: 12/07/18  5:46 PM  Result Value Ref Range Status   Specimen Description   Final    URINE, RANDOM Performed at [facility] [address], [place], [place] [address]    Special Requests   Final    NONE Performed at [facility], [place] [address] [place],  [address]    Culture >=100,000 COLONIES/mL ESCHERICHIA COLI (A)  Final   Report Status 12/10/2018 FINAL  Final   Organism ID, Bacteria ESCHERICHIA COLI (A)  Final      Susceptibility   Escherichia coli - MIC*    AMPICILLIN 8 SENSITIVE Sensitive     CEFAZOLIN <=4 SENSITIVE Sensitive     CEFTRIAXONE <=1 SENSITIVE Sensitive  CIPROFLOXACIN <=0.25 SENSITIVE Sensitive     GENTAMICIN <=1 SENSITIVE Sensitive     IMIPENEM <=0.25 SENSITIVE Sensitive     NITROFURANTOIN <=16 SENSITIVE Sensitive     TRIMETH/SULFA <=20 SENSITIVE Sensitive     AMPICILLIN/SULBACTAM 4 SENSITIVE Sensitive     PIP/TAZO <=4 SENSITIVE Sensitive     Extended ESBL NEGATIVE Sensitive     * >=100,000 COLONIES/mL ESCHERICHIA COLI  Culture, sputum-assessment     Status: None   Collection Time: 12/08/18  5:00 AM  Result Value Ref Range Status   Specimen Description SPUTUM  Final   Special Requests Normal  Final   Sputum evaluation   Final    THIS SPECIMEN IS ACCEPTABLE FOR SPUTUM CULTURE Performed at [facility] [address] [place], [place] [address]    Report Status 12/08/2018 FINAL  Final  Culture, respiratory     Status: None   Collection Time: 12/08/18  5:00 AM  Result Value Ref Range Status   Specimen Description   Final    SPUTUM Performed at [facility] [address] [place], [place] [address]    Special Requests   Final    Normal Reflexed from [id number] Performed at  [facility], [place] [address], [place], [place] [address]    Gram Stain   Final    FEW WBC PRESENT, PREDOMINANTLY PMN FEW GRAM POSITIVE RODS FEW GRAM POSITIVE COCCI IN CLUSTERS FEW GRAM NEGATIVE RODS    Culture   Final    FEW Consistent with normal respiratory flora. Performed at [facility], [place] [address], [place], [place] [address]    Report Status 12/10/2018 FINAL  Final  Culture, blood (routine x 2) Call MD if unable to obtain prior to antibiotics being given     Status: None (Preliminary result)   Collection Time: 12/08/18  5:10 AM  Result Value Ref Range Status   Specimen Description BLOOD RIGHT ANTECUBITAL  Final   Special Requests   Final    BOTTLES DRAWN AEROBIC AND ANAEROBIC Blood Culture adequate volume Performed at [facility] [address], [place], [place] [address]    Culture NO GROWTH 2 DAYS  Final   Report Status PENDING  Incomplete  Culture, blood (routine x 2) Call MD if unable to obtain prior to antibiotics being given     Status: None (Preliminary result)   Collection Time: 12/08/18  5:12 AM  Result Value Ref Range Status   Specimen Description BLOOD LEFT ANTECUBITAL  Final   Special Requests   Final    BOTTLES DRAWN AEROBIC AND ANAEROBIC Blood Culture adequate volume Performed at [facility] [address] [place],  [address]    Culture NO GROWTH 2 DAYS  Final   Report Status PENDING  Incomplete         Radiology Studies: Dg Chest Port 1 View  Result Date: 12/09/2018 CLINICAL DATA:  Shortness of breath.  Flu. EXAM: PORTABLE CHEST 1 VIEW COMPARISON:  12/07/2018. 03/24/2018. Ultrasound thyroid 06/28/2012. 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 Stable cardiomegaly. Interim slight progression of bibasilar pulmonary infiltrates. New infiltrate right upper lobe. Small bilateral pleural effusions. Findings are  again consistent with pneumonia. CHF can not be excluded. No pneumothorax. IMPRESSION: 1. Interim slight progression bibasilar pulmonary infiltrates. New infiltrate right upper lobe. Small bilateral pleural effusions. Findings are again consistent with pneumonia. CHF can not be excluded. 2. Displacement  the trachea to the right is again noted consistent patient's known multinodular goiter. Electronically Signed   By: Marcello Moores  Register   On: 12/09/2018 05:20        Scheduled Meds: . aspirin EC  81 mg Oral Daily  . doxycycline  100 mg Oral Q12H  . enoxaparin (LOVENOX) injection  60 mg Subcutaneous Q24H  . feeding supplement (PRO-STAT SUGAR FREE 64)  30 mL Oral Daily  . guaiFENesin  600 mg Oral BID  . ipratropium-albuterol  3 mL Nebulization TID  . oseltamivir  75 mg Oral BID  . predniSONE  40 mg Oral Q breakfast  . tamsulosin  0.4 mg Oral Daily   Continuous Infusions: . sodium chloride 100 mL/hr at 12/09/18 2050  . cefTRIAXone (ROCEPHIN)  IV 1 g (12/09/18 2054)     LOS: 3 days    Georgette Shell, MD [facility]  If 7PM-7AM, please contact night-coverage [email or web address] Password [email or web address] 12/10/2018, 12:40 PM

## 2018-12-11 LAB — COMPREHENSIVE METABOLIC PANEL
ALK PHOS: 51 U/L (ref 38–126)
ALT: 17 U/L (ref 0–44)
AST: 18 U/L (ref 15–41)
Albumin: 2.8 g/dL — ABNORMAL LOW (ref 3.5–5.0)
Anion gap: 5 (ref 5–15)
BUN: 11 mg/dL (ref 6–20)
CO2: 34 mmol/L — ABNORMAL HIGH (ref 22–32)
Calcium: 8.7 mg/dL — ABNORMAL LOW (ref 8.9–10.3)
Chloride: 101 mmol/L (ref 98–111)
Creatinine, Ser: 0.73 mg/dL (ref 0.61–1.24)
GFR calc Af Amer: >60 mL/min (ref >60)
GFR calc non Af Amer: >60 mL/min (ref >60)
GLUCOSE: 86 mg/dL (ref 70–99)
Potassium: 4.1 mmol/L (ref 3.5–5.1)
Sodium: 140 mmol/L (ref 135–145)
Total Bilirubin: 0.5 mg/dL (ref 0.3–1.2)
Total Protein: 6.2 g/dL — ABNORMAL LOW (ref 6.5–8.1)

## 2018-12-11 LAB — CBC
HCT: 41.2 % (ref 39.0–52.0)
HEMOGLOBIN: 12.3 g/dL — AB (ref 13.0–17.0)
MCH: 26.5 pg (ref 26.0–34.0)
MCHC: 29.9 g/dL — ABNORMAL LOW (ref 30.0–36.0)
MCV: 88.8 fL (ref 80.0–100.0)
Platelets: 280 10*3/uL (ref 150–400)
RBC: 4.64 MIL/uL (ref 4.22–5.81)
RDW: 15.9 % — ABNORMAL HIGH (ref 11.5–15.5)
WBC: 9.5 10*3/uL (ref 4.0–10.5)
nRBC: 0 % (ref 0.0–0.2)

## 2018-12-11 MED ORDER — NICOTINE 14 MG/24HR TD PT24
14.0000 mg | MEDICATED_PATCH | Freq: Every day | TRANSDERMAL | Status: DC
  Administered 2018-12-11 – 2018-12-12 (×2): 14 mg via TRANSDERMAL
  Filled 2018-12-11 (×2): qty 1

## 2018-12-11 MED ORDER — FUROSEMIDE 10 MG/ML IJ SOLN
20.0000 mg | Freq: Once | INTRAMUSCULAR | Status: AC
  Administered 2018-12-11: 20 mg via INTRAVENOUS
  Filled 2018-12-11: qty 2

## 2018-12-11 MED ORDER — BENZONATATE 100 MG PO CAPS
100.0000 mg | ORAL_CAPSULE | Freq: Three times a day (TID) | ORAL | Status: DC
  Administered 2018-12-11 – 2018-12-12 (×4): 100 mg via ORAL
  Filled 2018-12-11 (×4): qty 1

## 2018-12-11 NOTE — Plan of Care (Signed)
Patient reports feeling much improved on 7 a to 7 p shift, able to titrate oxygen down from 6 liters to 2  liters and maintain oxygen saturation in the low to mid 90's.  Patient did desat with ambulation to bathroom on room air down to the low 80's.

## 2018-12-11 NOTE — Progress Notes (Signed)
Pt. Continues to refuse CPAP at this time.  Will be available if patient changes his mind

## 2018-12-11 NOTE — Progress Notes (Signed)
Occupational Therapy Treatment Patient Details Name: Nicki Gracy MRN: [id number] DOB: 1961/10/03 Today's Date: 12/11/2018    History of present illness 58 y.o. male with medical history significant of recently, tobacco abuse, sleep apnea, and presented to ED for CAP, flu A positive   OT comments  PATIENT IS DOING WELL WITH PERFORMING ADLS POST EDUCATION ON ENERGY CONSERVATION. ENCOURAGED PATIENT OT READ THE INFORMATION SHEET ON ENERGY CONSERVATION. PATIENT 02 SATS WERE CHECKED WITHOUT O2 WHILE AMB IN THE BATHROOM AND THEY FELL TO 82-84 PERCENT. PATIENT O2 REBOUNED QUICKLY WITH O2. PATIENT WAS AMB IN [facility] AND HIS O2 SATS WERE 94-100 ON 3 L.   Follow Up Recommendations       Equipment Recommendations  None recommended by OT    Recommendations for Other Services      Precautions / Restrictions Precautions Precautions: Fall Precaution Comments: monitor sats  Restrictions Weight Bearing Restrictions: No       Mobility Bed Mobility         Supine to sit: Modified independent (Device/Increase time)        Transfers       Sit to Stand: Supervision         General transfer comment: s to amb in the hall holding onto dynamat    Balance                                           ADL either performed or assessed with clinical judgement   ADL                                         General ADL Comments: patient was s with le adls using cross leg technique. patient s with amb into bathroom and performing grooming in standing.      Vision       Perception     Praxis      Cognition Arousal/Alertness: Awake/alert Behavior During Therapy: WFL for tasks assessed/performed Overall Cognitive Status: Within Functional Limits for tasks assessed                                          Exercises     Shoulder Instructions       General Comments      Pertinent Vitals/ Pain       Pain  Assessment: No/denies pain  Home Living                                          Prior Functioning/Environment              Frequency  Min 2X/week        Progress Toward Goals  OT Goals(current goals can now be found in the care plan section)  Progress towards OT goals: Progressing toward goals  Acute Rehab OT Goals Patient Stated Goal: to get better  Plan Discharge plan remains appropriate    Co-evaluation                 AM-PAC OT "6 Clicks" Daily Activity     Outcome Measure  Help from another person eating meals?: None Help from another person taking care of personal grooming?: A Little Help from another person toileting, which includes using toliet, bedpan, or urinal?: A Little Help from another person bathing (including washing, rinsing, drying)?: A Little Help from another person to put on and taking off regular upper body clothing?: A Little Help from another person to put on and taking off regular lower body clothing?: A Little 6 Click Score: 19    End of Session Equipment Utilized During Treatment: Oxygen  OT Visit Diagnosis: Unsteadiness on feet (R26.81)   Activity Tolerance Patient tolerated treatment well(o2 sats on o2 94-100 and sats without o2 were 82-85%)   Patient Left in bed;with call bell/phone within reach   Nurse Communication (nurse asked for o2 sats while amb in hall with o2. )        Time: 2761-4709 OT Time Calculation (min): 34 min  Charges: OT General Charges $OT Visit: 1 Visit OT Treatments $Self Care/Home Management : 29-57 mins  6 CLICKS  Tabari Volkert 12/11/2018, 10:27 AM

## 2018-12-11 NOTE — Progress Notes (Addendum)
PROGRESS NOTE    [name] [name]  ZSW:[id number] DOB: January 22, 1961 DOA: 12/07/2018 PCP: Tanda Rockers, MD Brief Narrative:57 y.o.malewith medical history significant of recently, tobacco abuse, sleep apnea, currently disc disease   Presented with6 day hx of fever, cough Patient had mild symptoms for the past 1 week positive for runny nose cough headaches cough has been nonproductive although sometimes he coughs up a little bit of clear sputum he has had some wheezing he does have prescription for albuterol but that has expired. Recently has been seen in urgent care was diagnosed with pneumonia started on a Z-Pak has been taken for the past 3 days and has had some nausea and vomiting developed left-sided take antibiotics. Was seen in Dr. Leonides Schanz office today who has been acting as his primary care provider and was noted to have oxygen saturation down to 70s on room air was started on oxygen and went up to 92 on 4 L  Reportssat next to someone at church who had the flu. Did not take a flu shot this year. Fevers at home Reports chest pain only when he coughs in his ribs.  Assessment & Plan:   Principal Problem:   CAP (community acquired pneumonia) Active Problems:   Cigarette smoker   Elevated troponin   Acute respiratory failure with hypoxia (HCC)   Tobacco abuse   Influenza A   Reactive airway disease with wheezing with acute exacerbation   Pyuria   #1 acute hypoxic respiratory failure secondary to community-acquired pneumonia, COPD, influenza A-patient presented with complaints of fever cough and shortness of breath. He was found to have bibasilar pneumonia and influenza A. Continue doxycycline Rocephin and Tamiflu. Continue nebulizer treatments.Titrateoxygen to keep sats above 88%. Reports that he dropped his saturation while sleeping. Patient has history of untreated sleep apnea.  Out of bed.  Chest x-ray 2/1 some fluid overload effusion .give one dose of lasix.   Patient still dependent on 5 L of oxygen.  Discussed with RN to titrate his oxygen down as tolerated to keep her saturation above 88%.  #2 pleuritic chest pain with elevated troponin peaked at 0.18 with no acute EKG changes probably demand ischemia monitor closely.  echocardiogram.Ejection fraction 60 to 65%.  #3 sleep apnea with obesity wife reports that he was supposed to have a sleep study done tomorrow and follow-up with pulmonary.  #4 BPH with pyuria patient asymptomatic from urology standpoint. Continue flomax    Nutrition Problem: Increased nutrient needs Etiology: acute illness     Signs/Symptoms: estimated needs    Interventions: Refer to RD note for recommendations  Estimated body mass index is 39.33 kg/m as calculated from the following:   Height as of this encounter: 5\' 11"  (1.803 m).   Weight as of this encounter: 127.9 kg.  DVT prophylaxis: Lovenox Code Status: Full code Family Communication: None Disposition Plan: .  Pending clinical improvement   Consultants: None  Procedures: None Antimicrobials: Rocephin Tamiflu and doxycycline  Subjective: Feels better sitting by the side of the bed talking on the phone with his wife however still dependent on-oxygen over 5 L Objective: Vitals:   12/10/18 2139 12/11/18 0440 12/11/18 0759 12/11/18 0945  BP:  (!) 108/54    Pulse:  64    Resp:  16    Temp:  97.7 F (36.5 C)    TempSrc:  Oral    SpO2: (!) 88% 93% 94% 96%  Weight:      Height:        Intake/Output  Summary (Last 24 hours) at 12/11/2018 1130 Last data filed at 12/11/2018 0200 Gross per 24 hour  Intake 1110 ml  Output -  Net 1110 ml   Filed Weights   12/07/18 1603  Weight: 127.9 kg    Examination:  General exam: Appears calm and comfortable  Respiratory system: scattered rhonchi  to auscultation. Respiratory effort normal. Cardiovascular system: S1 & S2 heard, RRR. No JVD, murmurs, rubs, gallops or clicks. No pedal  edema. Gastrointestinal system: Abdomen is nondistended, soft and nontender. No organomegaly or masses felt. Normal bowel sounds heard. Central nervous system: Alert and oriented. No focal neurological deficits. Extremities:trace edema Skin: No rashes, lesions or ulcers Psychiatry: Judgement and insight appear normal. Mood & affect appropriate.     Data Reviewed: I have personally reviewed following labs and imaging studies  CBC: Recent Labs  Lab 12/07/18 1615 12/08/18 0500 12/11/18 0515  WBC 9.5 9.8 9.5  NEUTROABS 7.0  --   --   HGB 14.0 13.0 12.3*  HCT 46.9 44.5 41.2  MCV 90.4 89.0 88.8  PLT 222 239 629   Basic Metabolic Panel: Recent Labs  Lab 12/07/18 1615 12/08/18 0500 12/11/18 0515  NA 136 138 140  K 4.1 4.6 4.1  CL 97* 99 101  CO2 31 33* 34*  GLUCOSE 121* 122* 86  BUN 21* 17 11  CREATININE 1.04 0.88 0.73  CALCIUM 8.6* 8.3* 8.7*  MG  --  2.2  --   PHOS  --  3.4  --    GFR: Estimated Creatinine Clearance: 135.4 mL/min (by C-G formula based on SCr of 0.73 mg/dL). Liver Function Tests: Recent Labs  Lab 12/07/18 1615 12/08/18 0500 12/11/18 0515  AST 35 26 18  ALT 28 24 17   ALKPHOS 70 64 51  BILITOT 0.3 0.3 0.5  PROT 7.5 7.0 6.2*  ALBUMIN 3.6 3.2* 2.8*   Recent Labs  Lab 12/07/18 1615  LIPASE 32   No results for input(s): AMMONIA in the last 168 hours. Coagulation Profile: No results for input(s): INR, PROTIME in the last 168 hours. Cardiac Enzymes: Recent Labs  Lab 12/07/18 1615 12/07/18 1854 12/08/18 0317 12/08/18 1100 12/08/18 1723  TROPONINI 0.18* 0.13* 0.08* 0.06* 0.06*   BNP (last 3 results) No results for input(s): PROBNP in the last 8760 hours. HbA1C: No results for input(s): HGBA1C in the last 72 hours. CBG: No results for input(s): GLUCAP in the last 168 hours. Lipid Profile: No results for input(s): CHOL, HDL, LDLCALC, TRIG, CHOLHDL, LDLDIRECT in the last 72 hours. Thyroid Function Tests: No results for input(s): TSH,  T4TOTAL, FREET4, T3FREE, THYROIDAB in the last 72 hours. Anemia Panel: No results for input(s): VITAMINB12, FOLATE, FERRITIN, TIBC, IRON, RETICCTPCT in the last 72 hours. Sepsis Labs: Recent Labs  Lab 12/07/18 1730 12/07/18 1930  LATICACIDVEN 0.9 1.1    Recent Results (from the past 240 hour(s))  Urine culture     Status: Abnormal   Collection Time: 12/07/18  5:46 PM  Result Value Ref Range Status   Specimen Description   Final    URINE, RANDOM Performed at [facility] [address], [place], [place] [address]    Special Requests   Final    NONE Performed at [facility], [place] [address] [place], [place] [address]    Culture >=100,000 COLONIES/mL ESCHERICHIA COLI (A)  Final   Report Status 12/10/2018 FINAL  Final   Organism ID, Bacteria ESCHERICHIA COLI (A)  Final      Susceptibility  Escherichia coli - MIC*    AMPICILLIN 8 SENSITIVE Sensitive     CEFAZOLIN <=4 SENSITIVE Sensitive     CEFTRIAXONE <=1 SENSITIVE Sensitive     CIPROFLOXACIN <=0.25 SENSITIVE Sensitive     GENTAMICIN <=1 SENSITIVE Sensitive     IMIPENEM <=0.25 SENSITIVE Sensitive     NITROFURANTOIN <=16 SENSITIVE Sensitive     TRIMETH/SULFA <=20 SENSITIVE Sensitive     AMPICILLIN/SULBACTAM 4 SENSITIVE Sensitive     PIP/TAZO <=4 SENSITIVE Sensitive     Extended ESBL NEGATIVE Sensitive     * >=100,000 COLONIES/mL ESCHERICHIA COLI  Culture, sputum-assessment     Status: None   Collection Time: 12/08/18  5:00 AM  Result Value Ref Range Status   Specimen Description SPUTUM  Final   Special Requests Normal  Final   Sputum evaluation   Final    THIS SPECIMEN IS ACCEPTABLE FOR SPUTUM CULTURE Performed at [facility] [address], [place], [place] [address]    Report Status 12/08/2018 FINAL  Final  Culture, respiratory     Status: None   Collection Time: 12/08/18  5:00 AM  Result Value Ref Range Status   Specimen Description   Final     SPUTUM Performed at [facility] [address] [place], [place] [address]    Special Requests   Final    Normal Reflexed from [phone] Performed at [facility], [place] [address] [place], [place] [address]    Gram Stain   Final    FEW WBC PRESENT, PREDOMINANTLY PMN FEW GRAM POSITIVE RODS FEW GRAM POSITIVE COCCI IN CLUSTERS FEW GRAM NEGATIVE RODS    Culture   Final    FEW Consistent with normal respiratory flora. Performed at [facility], [place] [address], [place], [place] [address]    Report Status 12/10/2018 FINAL  Final  Culture, blood (routine x 2) Call MD if unable to obtain prior to antibiotics being given     Status: None (Preliminary result)   Collection Time: 12/08/18  5:10 AM  Result Value Ref Range Status   Specimen Description BLOOD RIGHT ANTECUBITAL  Final   Special Requests   Final    BOTTLES DRAWN AEROBIC AND ANAEROBIC Blood Culture adequate volume Performed at [facility] [address] [place], [place] [address]    Culture NO GROWTH 3 DAYS  Final   Report Status PENDING  Incomplete  Culture, blood (routine x 2) Call MD if unable to obtain prior to antibiotics being given     Status: None (Preliminary result)   Collection Time: 12/08/18  5:12 AM  Result Value Ref Range Status   Specimen Description BLOOD LEFT ANTECUBITAL  Final   Special Requests   Final    BOTTLES DRAWN AEROBIC AND ANAEROBIC Blood Culture adequate volume Performed at [facility] [address], [place], [place] [address]    Culture NO GROWTH 3 DAYS  Final   Report Status PENDING  Incomplete         Radiology Studies: Dg Chest 1 View  Result Date: 12/10/2018 CLINICAL DATA:  Hypoxia and nonproductive cough. EXAM: CHEST  1 VIEW COMPARISON:  Single-view of the chest 12/09/2018. PA and lateral chest 12/07/2018. FINDINGS: There is cardiomegaly and interstitial edema. Left worse than right basilar airspace  disease is unchanged. No pneumothorax. There are likely small bilateral pleural effusions. Displacement of the trachea to the right is consistent enlargement of the left lobe of the thyroid, unchanged.  No acute or focal abnormality. IMPRESSION: No change in small bilateral and basilar airspace disease pleural effusions, worse on the left. Cardiomegaly and mild interstitial edema. Electronically Signed   By: Inge Rise M.D.   On: 12/10/2018 15:21        Scheduled Meds: . aspirin EC  81 mg Oral Daily  . doxycycline  100 mg Oral Q12H  . enoxaparin (LOVENOX) injection  60 mg Subcutaneous Q24H  . feeding supplement (PRO-STAT SUGAR FREE 64)  30 mL Oral Daily  . guaiFENesin  600 mg Oral BID  . ipratropium-albuterol  3 mL Nebulization TID  . nicotine  14 mg Transdermal Daily  . oseltamivir  75 mg Oral BID  . predniSONE  40 mg Oral Q breakfast  . tamsulosin  0.4 mg Oral Daily   Continuous Infusions: . sodium chloride 100 mL/hr at 12/11/18 0424  . cefTRIAXone (ROCEPHIN)  IV 1 g (12/10/18 1719)     LOS: 4 days     Georgette Shell, MD [facility]  If 7PM-7AM, please contact night-coverage [email or web address] Password [email or web address] 12/11/2018, 11:30 AM

## 2018-12-12 MED ORDER — POTASSIUM CHLORIDE CRYS ER 20 MEQ PO TBCR
40.0000 meq | EXTENDED_RELEASE_TABLET | Freq: Once | ORAL | Status: AC
  Administered 2018-12-12: 40 meq via ORAL
  Filled 2018-12-12: qty 2

## 2018-12-12 MED ORDER — NICOTINE 14 MG/24HR TD PT24: 14.0000 mg | MEDICATED_PATCH | Freq: Every day | TRANSDERMAL | 0 refills | Status: DC

## 2018-12-12 MED ORDER — FUROSEMIDE 10 MG/ML IJ SOLN
20.0000 mg | Freq: Once | INTRAMUSCULAR | Status: AC
  Administered 2018-12-12: 20 mg via INTRAVENOUS
  Filled 2018-12-12: qty 2

## 2018-12-12 NOTE — Discharge Summary (Signed)
Physician Discharge Summary  [name] [name] YIR:[id number] DOB: 1961-06-01 DOA: 12/07/2018  PCP: Tanda Rockers, MD  Admit date: 12/07/2018 Discharge date: 12/12/2018  Admitted From home Disposition: Home Recommendations for Outpatient Follow-up:  1. Follow up with PCP in 1-2 weeks 2. Please obtain BMP/CBC in one week 3. Follow-up with Dr. Melvyn Novas 4. Scheduled for sleep study March 1 please follow-up on that   [place] none Equipment/Devices  oxygen at 2 L Discharge Condition stable and improved CODE STATUS: Full code Diet recommendation cardiac Brief/Interim Summary:57 y.o.malewith medical history significant of recently, tobacco abuse, sleep apnea, currently disc disease   Presented with6 day hx of fever, cough Patient had mild symptoms for the past 1 week positive for runny nose cough headaches cough has been nonproductive although sometimes he coughs up a little bit of clear sputum he has had some wheezing he does have prescription for albuterol but that has expired. Recently has been seen in urgent care was diagnosed with pneumonia started on a Z-Pak has been taken for the past 3 days and has had some nausea and vomiting developed left-sided take antibiotics. Was seen in Dr. Leonides Schanz office today who has been acting as his primary care provider and was noted to have oxygen saturation down to 70s on room air was started on oxygen and went up to 92 on 4 L  Reportssat next to someone at church who had the flu. Did not take a flu shot this year. Fevers at home Reports chest pain only when he coughs in his ribs  Discharge Diagnoses:  Principal Problem:   CAP (community acquired pneumonia) Active Problems:   Cigarette smoker   Elevated troponin   Acute respiratory failure with hypoxia ([place])   Tobacco abuse   Influenza A   Reactive airway disease with wheezing with acute exacerbation   Pyuria  #1 acute hypoxic respiratory failure secondary to community-acquired  pneumonia, COPD, influenza A-patient presented with complaints of fever cough and shortness of breath. He was found to have bibasilar pneumonia and influenza A. Treated with doxycycline Rocephin and Tamiflu oxygen and nebulizer treatments.Titratedoxygen to keep sats above 88%. Reports that he dropped his saturation to 70s while sleeping. Patient has history ofuntreatedsleep apnea.Chest x-ray 2/1 some fluid overload effusion .given 2  dose of 20 mg  lasix.   Patient was on IV fluids for few days which probably have led to this fluid overload in his chest.   This patient has a diagnosis of COPD he needs to use noninvasive ventilation at night and as needed during the daytime.  He is able and willing to use NIV at home.  He tolerated CPAP in the hospital.  This will prevent him from readmission to hospital as well as COPD exacerbations.  He needs to use the NIV along with current medications that I am discharging on him today.  Failure to use this over 24-hour period  Can lead to death.  #2 pleuritic chest pain with elevated troponin peaked at 0.18 with no acute EKG changes probably demand ischemia monitor closely. echocardiogram.Ejection fraction 60 to 65%.  #3 sleep apnea with obesity wife reports that he was supposed to have a sleep study done tomorrow and follow-up with pulmonary.  #4 BPH with pyuria patient asymptomatic from urology standpoint. Continue flomax     Nutrition Problem: Increased nutrient needs Etiology: acute illness    Signs/Symptoms: estimated needs     Interventions: Refer to RD note for recommendations  Estimated body mass index is 39.33 kg/m  as calculated from the following:   Height as of this encounter: 5\' 11"  (1.803 m).   Weight as of this encounter: 127.9 kg.  Discharge Instructions  Discharge Instructions    Call MD for:  difficulty breathing, headache or visual disturbances   Complete by:  As directed    Diet - low sodium heart  healthy   Complete by:  As directed    Increase activity slowly   Complete by:  As directed      Allergies as of 12/12/2018   No Known Allergies     Medication List    STOP taking these medications   azithromycin 250 MG tablet Commonly known as:  ZITHROMAX   HYDROcodone-homatropine 5-1.5 MG/5ML syrup Commonly known as:  HYCODAN   ibuprofen 200 MG tablet Commonly known as:  ADVIL,MOTRIN   meloxicam 7.5 MG tablet Commonly known as:  MOBIC     TAKE these medications   aspirin 81 MG tablet Take 81 mg by mouth daily.   nicotine 14 mg/24hr patch Commonly known as:  NICODERM CQ - dosed in mg/24 hours Place 1 patch (14 mg total) onto the skin daily. Start taking on:  December 13, 2018   tamsulosin 0.4 MG Caps capsule Commonly known as:  FLOMAX Take 0.4 mg by mouth.            Durable Medical Equipment  (From admission, onward)         Start     Ordered   12/12/18 1227  DME Oxygen  Once    Question Answer Comment  Mode or (Route) Nasal cannula   Liters per Minute 2   Frequency Continuous (stationary and portable oxygen unit needed)   Oxygen conserving device Yes   Oxygen delivery system Gas      12/12/18 1226         Follow-up Information    Tanda Rockers, MD Follow up.   Specialty:  Pulmonary Disease Contact information: [place] [address] [phone]          No Known Allergies  Consultations:  none   Procedures/Studies: Dg Chest 1 View  Result Date: 12/10/2018 CLINICAL DATA:  Hypoxia and nonproductive cough. EXAM: CHEST  1 VIEW COMPARISON:  Single-view of the chest 12/09/2018. PA and lateral chest 12/07/2018. FINDINGS: There is cardiomegaly and interstitial edema. Left worse than right basilar airspace disease is unchanged. No pneumothorax. There are likely small bilateral pleural effusions. Displacement of the trachea to the right is consistent enlargement of the left lobe of the thyroid, unchanged. No acute or  focal abnormality. IMPRESSION: No change in small bilateral and basilar airspace disease pleural effusions, worse on the left. Cardiomegaly and mild interstitial edema. Electronically Signed   By: Inge Rise M.D.   On: 12/10/2018 15:21   Dg Chest 2 View  Result Date: 12/07/2018 CLINICAL DATA:  Cough, shortness of breath, and wheezing. EXAM: CHEST - 2 VIEW COMPARISON:  Chest x-ray dated Mar 24, 2018. FINDINGS: Stable mild cardiomegaly. Normal pulmonary vascularity. Increased patchy infiltrates in the left greater than right lower lobes. No pleural effusion or pneumothorax. No acute osseous abnormality. IMPRESSION: 1. Bilateral lower lobe pneumonia. This could also reflect aspiration given history of vomiting. Electronically Signed   By: Titus Dubin M.D.   On: 12/07/2018 17:12   Dg Chest Port 1 View  Result Date: 12/09/2018 CLINICAL DATA:  Shortness of breath.  Flu. EXAM: PORTABLE CHEST 1 VIEW COMPARISON:  12/07/2018. 03/24/2018. Ultrasound thyroid  06/28/2012. 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 Stable cardiomegaly. Interim slight progression of bibasilar pulmonary infiltrates. New infiltrate right upper lobe. Small bilateral pleural effusions. Findings are again consistent with pneumonia. CHF can not be excluded. No pneumothorax. IMPRESSION: 1. Interim slight progression bibasilar pulmonary infiltrates. New infiltrate right upper lobe. Small bilateral pleural effusions. Findings are again consistent with pneumonia. CHF can not be excluded. 2. Displacement the trachea to the right is again noted consistent patient's known multinodular goiter. Electronically Signed   By: Marcello Moores  Register   On: 12/09/2018 05:20    (Echo, Carotid, EGD, Colonoscopy, ERCP)    Subjective:   Discharge Exam: Vitals:   12/12/18 0450 12/12/18 0730  BP: 138/73   Pulse: 68   Resp: 16   Temp: 98.6 F (37 C)   SpO2: 93% 96%   Vitals:    12/11/18 2159 12/11/18 2221 12/12/18 0450 12/12/18 0730  BP: (!) 168/61  138/73   Pulse: 62  68   Resp: 18  16   Temp: 98.4 F (36.9 C)  98.6 F (37 C)   TempSrc: Oral  Oral   SpO2: 93% 95% 93% 96%  Weight:      Height:        General: Pt is alert, awake, not in acute distress Cardiovascular: RRR, S1/S2 +, no rubs, no gallops Respiratory: CTA bilaterally, no wheezing, no rhonchi Abdominal: Soft, NT, ND, bowel sounds + Extremities: no edema, no cyanosis    The results of significant diagnostics from this hospitalization (including imaging, microbiology, ancillary and laboratory) are listed below for reference.     Microbiology: Recent Results (from the past 240 hour(s))  Urine culture     Status: Abnormal   Collection Time: 12/07/18  5:46 PM  Result Value Ref Range Status   Specimen Description   Final    URINE, RANDOM Performed at [facility] [address], [place], [place] [address]    Special Requests   Final    NONE Performed at [facility], [place] [address] [place], [place] [address]    Culture >=100,000 COLONIES/mL ESCHERICHIA COLI (A)  Final   Report Status 12/10/2018 FINAL  Final   Organism ID, Bacteria ESCHERICHIA COLI (A)  Final      Susceptibility   Escherichia coli - MIC*    AMPICILLIN 8 SENSITIVE Sensitive     CEFAZOLIN <=4 SENSITIVE Sensitive     CEFTRIAXONE <=1 SENSITIVE Sensitive     CIPROFLOXACIN <=0.25 SENSITIVE Sensitive     GENTAMICIN <=1 SENSITIVE Sensitive     IMIPENEM <=0.25 SENSITIVE Sensitive     NITROFURANTOIN <=16 SENSITIVE Sensitive     TRIMETH/SULFA <=20 SENSITIVE Sensitive     AMPICILLIN/SULBACTAM 4 SENSITIVE Sensitive     PIP/TAZO <=4 SENSITIVE Sensitive     Extended ESBL NEGATIVE Sensitive     * >=100,000 COLONIES/mL ESCHERICHIA COLI  Culture, sputum-assessment     Status: None   Collection Time: 12/08/18  5:00 AM  Result Value Ref Range Status   Specimen Description SPUTUM  Final    Special Requests Normal  Final   Sputum evaluation   Final    THIS SPECIMEN IS ACCEPTABLE FOR SPUTUM CULTURE Performed at [facility] [address], [place], [place] [address]    Report Status 12/08/2018 FINAL  Final  Culture, respiratory     Status: None   Collection Time: 12/08/18  5:00 AM  Result Value Ref  Range Status   Specimen Description   Final    SPUTUM Performed at [facility] [address], [place], [place] [address]    Special Requests   Final    Normal Reflexed from [phone] Performed at [facility], [place] [address], [place], [place] [address]    Gram Stain   Final    FEW WBC PRESENT, PREDOMINANTLY PMN FEW GRAM POSITIVE RODS FEW GRAM POSITIVE COCCI IN CLUSTERS FEW GRAM NEGATIVE RODS    Culture   Final    FEW Consistent with normal respiratory flora. Performed at [facility], [place] [address] [place], [place] [address]    Report Status 12/10/2018 FINAL  Final  Culture, blood (routine x 2) Call MD if unable to obtain prior to antibiotics being given     Status: None (Preliminary result)   Collection Time: 12/08/18  5:10 AM  Result Value Ref Range Status   Specimen Description   Final    BLOOD RIGHT ANTECUBITAL Performed at [facility] [address] [place], [place] [address]    Special Requests   Final    BOTTLES DRAWN AEROBIC AND ANAEROBIC Blood Culture adequate volume Performed at [facility] [address], [place], [place] [address]    Culture   Final    NO GROWTH 4 DAYS Performed at [facility], [place] [address] [place], [place] [address]    Report Status PENDING  Incomplete  Culture, blood (routine x 2) Call MD if unable to obtain prior to antibiotics being given     Status: None (Preliminary result)   Collection Time: 12/08/18  5:12 AM  Result Value Ref Range Status   Specimen Description   Final    BLOOD LEFT ANTECUBITAL Performed at  [facility] [address] [place], [place] [address]    Special Requests   Final    BOTTLES DRAWN AEROBIC AND ANAEROBIC Blood Culture adequate volume Performed at [facility] [address], [place], [place] [address]    Culture   Final    NO GROWTH 4 DAYS Performed at [facility], [place] [address], [place], [place] [address]    Report Status PENDING  Incomplete     Labs: BNP (last 3 results) Recent Labs    12/07/18 1615  BNP 272.5*   Basic Metabolic Panel: Recent Labs  Lab 12/07/18 1615 12/08/18 0500 12/11/18 0515  NA 136 138 140  K 4.1 4.6 4.1  CL 97* 99 101  CO2 31 33* 34*  GLUCOSE 121* 122* 86  BUN 21* 17 11  CREATININE 1.04 0.88 0.73  CALCIUM 8.6* 8.3* 8.7*  MG  --  2.2  --   PHOS  --  3.4  --    Liver Function Tests: Recent Labs  Lab 12/07/18 1615 12/08/18 0500 12/11/18 0515  AST 35 26 18  ALT 28 24 17   ALKPHOS 70 64 51  BILITOT 0.3 0.3 0.5  PROT 7.5 7.0 6.2*  ALBUMIN 3.6 3.2* 2.8*   Recent Labs  Lab 12/07/18 1615  LIPASE 32   No results for input(s): AMMONIA in the last 168 hours. CBC: Recent Labs  Lab 12/07/18 1615 12/08/18 0500 12/11/18 0515  WBC 9.5 9.8 9.5  NEUTROABS 7.0  --   --   HGB 14.0 13.0 12.3*  HCT 46.9 44.5 41.2  MCV 90.4 89.0 88.8  PLT 222 239 280   Cardiac Enzymes: Recent Labs  Lab 12/07/18 1615 12/07/18 1854 12/08/18 0317 12/08/18 1100 12/08/18 1723  TROPONINI 0.18* 0.13* 0.08* 0.06* 0.06*   BNP: Invalid input(s): POCBNP CBG: No results for input(s): GLUCAP in the last 168 hours. D-Dimer No results for input(s): DDIMER in the last 72 hours. Hgb A1c No results for input(s): HGBA1C in the last 72 hours. Lipid Profile No results for input(s): CHOL, HDL, LDLCALC, TRIG, CHOLHDL, LDLDIRECT in the last 72 hours. Thyroid function studies No results for input(s): TSH, T4TOTAL, T3FREE, THYROIDAB in the last 72 hours.  Invalid input(s): FREET3 Anemia work up No  results for input(s): VITAMINB12, FOLATE, FERRITIN, TIBC, IRON, RETICCTPCT in the last 72 hours. Urinalysis    Component Value Date/Time   COLORURINE AMBER (A) 12/07/2018 1746   APPEARANCEUR HAZY (A) 12/07/2018 1746   LABSPEC 1.027 12/07/2018 1746   PHURINE 5.0 12/07/2018 1746   GLUCOSEU NEGATIVE 12/07/2018 1746   GLUCOSEU NEGATIVE 03/24/2018 0958   HGBUR MODERATE (A) 12/07/2018 1746   BILIRUBINUR NEGATIVE 12/07/2018 1746   KETONESUR 5 (A) 12/07/2018 1746   PROTEINUR 100 (A) 12/07/2018 1746   UROBILINOGEN 1.0 03/24/2018 0958   NITRITE NEGATIVE 12/07/2018 1746   LEUKOCYTESUR SMALL (A) 12/07/2018 1746   Sepsis Labs Invalid input(s): PROCALCITONIN,  WBC,  LACTICIDVEN Microbiology Recent Results (from the past 240 hour(s))  Urine culture     Status: Abnormal   Collection Time: 12/07/18  5:46 PM  Result Value Ref Range Status   Specimen Description   Final    URINE, RANDOM Performed at [facility], [place] [address], [place], [place] [address]    Special Requests   Final    NONE Performed at [facility], [place] [address] [place], [place] [address]    Culture >=100,000 COLONIES/mL ESCHERICHIA COLI (A)  Final   Report Status 12/10/2018 FINAL  Final   Organism ID, Bacteria ESCHERICHIA COLI (A)  Final      Susceptibility   Escherichia coli - MIC*    AMPICILLIN 8 SENSITIVE Sensitive     CEFAZOLIN <=4 SENSITIVE Sensitive     CEFTRIAXONE <=1 SENSITIVE Sensitive     CIPROFLOXACIN <=0.25 SENSITIVE Sensitive     GENTAMICIN <=1 SENSITIVE Sensitive     IMIPENEM <=0.25 SENSITIVE Sensitive     NITROFURANTOIN <=16 SENSITIVE Sensitive     TRIMETH/SULFA <=20 SENSITIVE Sensitive     AMPICILLIN/SULBACTAM 4 SENSITIVE Sensitive     PIP/TAZO <=4 SENSITIVE Sensitive     Extended ESBL NEGATIVE Sensitive     * >=100,000 COLONIES/mL ESCHERICHIA COLI  Culture, sputum-assessment     Status: None   Collection Time: 12/08/18  5:00 AM  Result Value Ref Range Status    Specimen Description SPUTUM  Final   Special Requests Normal  Final   Sputum evaluation   Final    THIS SPECIMEN IS ACCEPTABLE FOR SPUTUM CULTURE Performed at [facility] [address], [place], [place] [address]    Report Status 12/08/2018 FINAL  Final  Culture, respiratory     Status: None   Collection Time: 12/08/18  5:00 AM  Result Value Ref Range Status   Specimen Description   Final    SPUTUM Performed at [facility] [address] [place], [place] [address]    Special Requests   Final    Normal Reflexed from [phone] Performed at [facility], [place] [address], [place], [place] [address]    Gram Stain   Final    FEW WBC PRESENT,  PREDOMINANTLY PMN FEW GRAM POSITIVE RODS FEW GRAM POSITIVE COCCI IN CLUSTERS FEW GRAM NEGATIVE RODS    Culture   Final    FEW Consistent with normal respiratory flora. Performed at [facility], [place] [address], [place], [place] [address]    Report Status 12/10/2018 FINAL  Final  Culture, blood (routine x 2) Call MD if unable to obtain prior to antibiotics being given     Status: None (Preliminary result)   Collection Time: 12/08/18  5:10 AM  Result Value Ref Range Status   Specimen Description   Final    BLOOD RIGHT ANTECUBITAL Performed at [facility] [address], [place], [place] [address]    Special Requests   Final    BOTTLES DRAWN AEROBIC AND ANAEROBIC Blood Culture adequate volume Performed at [facility] [address] [place], [place] [address]    Culture   Final    NO GROWTH 4 DAYS Performed at [facility], [place] [address], [place], [place] [address]    Report Status PENDING  Incomplete  Culture, blood (routine x 2) Call MD if unable to obtain prior to antibiotics being given     Status: None (Preliminary result)   Collection Time: 12/08/18  5:12 AM  Result Value Ref Range Status   Specimen Description   Final     BLOOD LEFT ANTECUBITAL Performed at [facility] [address], [place], [place] [address]    Special Requests   Final    BOTTLES DRAWN AEROBIC AND ANAEROBIC Blood Culture adequate volume Performed at [facility] [address], [place], [place] [address]    Culture   Final    NO GROWTH 4 DAYS Performed at [facility], [place] [address] [place], [place] [address]    Report Status PENDING  Incomplete     Time coordinating discharge: 33  minutes  SIGNED:   Georgette Shell, MD  [facility] 12/12/2018, 12:27 PM Pager   If 7PM-7AM, please contact night-coverage [email or web address] Password [email or web address]

## 2018-12-12 NOTE — Progress Notes (Addendum)
Physical Therapy Treatment Patient Details Name: [name] [name] MRN: [id number] DOB: [birth year] Today's Date: 12/12/2018     SATURATION QUALIFICATIONS: (This note is used to comply with regulatory documentation for home oxygen)  Patient Saturations on Room Air at Rest = 91%  Patient Saturations on [employer] while Ambulating = 88-90%     History of Present Illness 58 y.o. male with medical history significant of recently, tobacco abuse, sleep apnea, and presented to ED for CAP, flu A positive    PT Comments    Progressing with mobility. See above for O2 sat requirements during session. When pt lies down, O2 sat level hovers around 88-89% on RA. Made RN aware. Left [place] near pt so he can wear it when/as needed (particularly if monitor starts alarming-pt agreeable)  Follow Up Recommendations  No PT follow up     Equipment Recommendations  None recommended by PT    Recommendations for Other Services       Precautions / Restrictions Precautions Precautions: Fall Precaution Comments: monitor sats  Restrictions Weight Bearing Restrictions: No    Mobility  Bed Mobility Overal bed mobility: Independent                Transfers Overall transfer level: Independent                  Ambulation/Gait Ambulation/Gait assistance: Modified independent (Device/Increase time) Gait Distance (Feet): 250 Feet Assistive device: None Gait Pattern/deviations: Step-through pattern     General Gait Details: slow gait speed. mild lightheadedness. O2 sat 88-90% on RA during ambulation.   Stairs             Wheelchair Mobility    Modified Rankin (Stroke Patients Only)       Balance Overall balance assessment: Mild deficits observed, not formally tested                                          Cognition Arousal/Alertness: Awake/alert Behavior During Therapy: WFL for tasks assessed/performed Overall Cognitive Status: Within Functional Limits  for tasks assessed                                        Exercises      General Comments        Pertinent Vitals/Pain Pain Assessment: No/denies pain    Home Living                      Prior Function            PT Goals (current goals can now be found in the care plan section) Progress towards PT goals: Progressing toward goals    Frequency    Min 3X/week      PT Plan Current plan remains appropriate    Co-evaluation              AM-PAC PT "6 Clicks" Mobility   Outcome Measure  Help needed turning from your back to your side while in a flat bed without using bedrails?: None Help needed moving from lying on your back to sitting on the side of a flat bed without using bedrails?: None Help needed moving to and from a bed to a chair (including a wheelchair)?: None Help needed standing up from a chair using your  arms (e.g., wheelchair or bedside chair)?: None Help needed to walk in hospital room?: A Little Help needed climbing 3-5 steps with a railing? : A Little 6 Click Score: 22    End of Session Equipment Utilized During Treatment: Oxygen Activity Tolerance: Patient tolerated treatment well Patient left: in bed;with call bell/phone within reach   PT Visit Diagnosis: Difficulty in walking, not elsewhere classified (R26.2)     Time: 4742-5956 PT Time Calculation (min) (ACUTE ONLY): 27 min  Charges:  $Gait Training: 23-37 mins                        Weston Anna, [place] Pager: [phone] Office: [phone]

## 2018-12-12 NOTE — Progress Notes (Signed)
Per [facility] they are working on the Non invasive vent(NIV) process-Nsg aware to await until the outcome of the NIV prior to d/c.

## 2018-12-12 NOTE — Progress Notes (Signed)
SATURATION QUALIFICATIONS: (This note is used to comply with regulatory documentation for home oxygen)  Patient Saturations on Room Air at Rest =93  Patient Saturations on Room Air while Ambulating =88  Patient Saturations on 2Liters of oxygen while Ambulating = 92  Please briefly explain why patient needs home oxygen: 

## 2018-12-12 NOTE — Care Management Note (Signed)
Case Management Note  Patient Details  Name: Latavius Capizzi MRN: [id number] Date of Birth: Jul 11, 1961  Subjective/Objective:  PNA. Hx: COPD. From home. Noted 02 sats-ordered for home 02-[facility] rep Santiago Glad aware to deliver travel tank to rm prior d/c. No further CM needs.                  Action/Plan:d/c home w/home 02.   Expected Discharge Date:  12/12/18               Expected Discharge Plan:  Home/Self Care  In-House Referral:     Discharge planning Services  CM Consult  Post Acute Care Choice:    Choice offered to:     DME Arranged:  Oxygen DME Agency:  [place]:    [facility] Agency:     Status of Service:  Completed, signed off  If discussed at [place] of Stay Meetings, dates discussed:    Additional Comments:  Dessa Phi, RN 12/12/2018, 1:23 PM

## 2018-12-12 NOTE — Progress Notes (Signed)
Contacted another dme agency about Non Invasive Vent-patient does not qualify. MD updated.Nsg can d/c home w/home 02 travel tank. No further CM needs.

## 2018-12-13 LAB — CULTURE, BLOOD (ROUTINE X 2)
Culture: NO GROWTH
Culture: NO GROWTH
Special Requests: ADEQUATE
Special Requests: ADEQUATE

## 2018-12-16 ENCOUNTER — Ambulatory Visit (INDEPENDENT_AMBULATORY_CARE_PROVIDER_SITE_OTHER)
Admission: RE | Admit: 2018-12-16 | Discharge: 2018-12-16 | Disposition: A | Discharge status: Home / Self Care | Payer: BC Managed Care – PPO | Source: Ambulatory Visit | Attending: Internal Medicine | Admitting: Internal Medicine

## 2018-12-16 ENCOUNTER — Ambulatory Visit (INDEPENDENT_AMBULATORY_CARE_PROVIDER_SITE_OTHER): Payer: BC Managed Care – PPO | Admitting: Internal Medicine

## 2018-12-16 ENCOUNTER — Encounter: Payer: Self-pay | Admitting: Internal Medicine

## 2018-12-16 VITALS — BP 126/70 | HR 87 | Ht 71.0 in | Wt 279.4 lb

## 2018-12-16 DIAGNOSIS — J209 Acute bronchitis, unspecified: Secondary | ICD-10-CM | POA: Diagnosis not present

## 2018-12-16 DIAGNOSIS — J189 Pneumonia, unspecified organism: Secondary | ICD-10-CM | POA: Diagnosis not present

## 2018-12-16 DIAGNOSIS — J9601 Acute respiratory failure with hypoxia: Secondary | ICD-10-CM | POA: Diagnosis not present

## 2018-12-16 NOTE — Patient Instructions (Addendum)
Continue 02 2lpm at bedtime and no smoking   For cough > mucinex dm 1200 mg every 12 hours as needed    Please remember to go to the lab and x-ray department   for your tests - we will call you with the results when they are available.     Goal for 02 is to keep your saturations > 90%  Late add Keep on 2lpm bedtime  until sleep eval/ no no need during the day if sats > 90% with activities      Keep your appt for a sleep study and I will see you Mar 25 2019 for cpx - call sooner if needed  Late add  recheck fasting at next ov along with hgbA1C

## 2018-12-16 NOTE — Progress Notes (Signed)
Subjective:    Patient ID: [name] [name], male    DOB: 08/10/1961   MRN: [id number]  Brief patient profile:  31  yobm with morbid obesity quit smoking 11/2018 with documented sleep apnea for which he has seen Dr. Halford Chessman with no improvement in his weight over the despite previous extensive counseling regarding calorie balance issues     History of Present Illness   11/16/2017  Acute ov/Wert re:  DJD/ new acute cough x 10 days  Chief Complaint  Patient presents with  . Acute Visit    Appt scheduled to get refill on meloxicam for hip pain. He states he has had a cold for the past wk. He c/o runny nose, cough and HA. His cough is mainly non prod but he will occ produce some clear sputum. He has an albuterol inhaler but it's expired and he has not really felt like he needs it.   noct cough /wheeze  mucus clear since acute  onset of uri x one week with rhinorrhea, has used saba in past prn but rx ran out/ rarely needs despite ongoing smoking  rec For cough/ congestion > mucinex dm up to 1200 mg every 12 hours as needed  zpak  For wheeze/ short of breath >> albuterol inhaler up to 2 puff every 4 hours as needed  If not improving > Prednisone 10 mg take  4 each am x 2 days,   2 each am x 2 days,  1 each am x 2 days and stop    03/24/2018  f/u ov/Wert re:  Cpx/ cough never > 50% better, never filled rx for  albuterol /prednisone  Chief Complaint  Patient presents with  . Annual Exam    Pt is fasting. He is c/o cough and nasal congestion for the past wk. His cough is non prod. He has had some green nasal d/c.   Dyspnea:   Not limited by breathing from desired activities   Cough: worse immediately when   w/in 5 min rhs arely takes mucinex dm  Sleep: was ok until 2 weeks prior to OV   SABA use:   Never purchased   zpak rx by UC 03/18/18 some better  Nasal obst / congestion/ usually clear  X sev years rec For cough > mucinex dm up to 1200 mg every 12 hours as needed  Ear wax remover is over  the counter  Add pepcid 20 mg after supper until next office visit GERD . Augmentin 875 mg take one pill twice daily  X 10 days - take at breakfast and supper with large glass of water.  It would help reduce the usual side effects (diarrhea and yeast infections) if you ate cultured yogurt at lunch.  Please see patient coordinator before you leave today  to schedule sinus CT in 2 weeks no sooner Please remember to go to the lab and x-ray department downstairs in the basement  for your tests - we will call you with the results when they are available.   Please schedule a follow up office visit in 6 weeks, call sooner if needed with all medications /inhalers/ solutions in hand so we can verify exactly what you are taking. This includes all medications from all doctors and over the counters  - PFTs on return  - Prevnar 13 on return    05/06/2018  f/u ov/Wert re: obesity / smoking  Chief Complaint  Patient presents with  . Follow-up    PFT's done today. Cough  has resolved.  Dyspnea:  Not limited by breathing from desired activities   Cough: resolved  Sleeping: ok flat rec Ear wax remover over the counter then see [name]  if not better   Prevnar 13 today  Although I don't endorse regular use of e cigs/ many pts find them helpful; however, I emphasized they should be considered a "one-way bridge" off all tobacco products.    07/15/18 [name] eval for osa rec History of obstructive sleep apnea Worsening symptoms, increasing weight gain, nonrestorative sleep Will order a split-night study Encouraged about weight loss efforts  see you back in the office in about 6 to 8 weeks following initiation of treatment       12/07/2018 acute extended ov/Wert re: smoker with cough/fever/ vomiting/ sob with new hypoxemia and low bp Chief Complaint  Patient presents with  . Acute Visit    Pt c/o cough and fever x 1 wk- started on zpack per UC and not improving. He is coughing up minimal yellow  sputum. He had vomiting this morning after he took zithromax. His sats on RA at rest today 70%---increased to 92% 4lpm o2.      Acute onset of "head cold" one week down to chest then to UC 1/26 > zpak> nand v  and cough med made him loupy. rec To ER     Admit date: 12/07/2018 Discharge date: 12/12/2018  Admitted From home Disposition: Home Recommendations for Outpatient Follow-up:  1. Follow up with PCP in 1-2 weeks 2. Please obtain BMP/CBC in one week 3. Follow-up with Dr. Melvyn Novas 4. Scheduled for sleep study March 1 please follow-up on that   Weed none Equipment/Devices  oxygen at 2 L Discharge Condition stable and improved CODE STATUS: Full code Diet recommendation cardiac Brief/Interim Summary:57 y.o.malewith medical history significant of recently, tobacco abuse, sleep apnea, currently disc disease   Presented with6 day hx of fever, cough Patient had mild symptoms for the past 1 week positive for runny nose cough headaches cough has been nonproductive although sometimes he coughs up a little bit of clear sputum he has had some wheezing he does have prescription for albuterol but that has expired. Recently has been seen in urgent care was diagnosed with pneumonia started on a Z-Pak has been taken for the past 3 days and has had some nausea and vomiting developed left-sided take antibiotics. Was seen in Dr. Leonides Schanz office today who has been acting as his primary care provider and was noted to have oxygen saturation down to 70s on room air was started on oxygen and went up to 92 on 4 L  Reportssat next to someone at church who had the flu. Did not take a flu shot this year. Fevers at home Reports chest pain only when he coughs in his ribs  Discharge Diagnoses:  Principal Problem:   CAP (community acquired pneumonia) Active Problems:   Cigarette smoker   Elevated troponin   Acute respiratory failure with hypoxia ([place])   Tobacco abuse   Influenza A    Reactive airway disease with wheezing with acute exacerbation   Pyuria  #1 acute hypoxic respiratory failure secondary to community-acquired pneumonia, COPD, influenza A-patient presented with complaints of fever cough and shortness of breath. He was found to have bibasilar pneumonia and influenza A. Treated with doxycycline Rocephin and Tamiflu oxygen and nebulizer treatments.Titratedoxygen to keep sats above 88%.Reports that he dropped his saturation to 70s while sleeping. Patient has history ofuntreatedsleep apnea.Chest x-ray2/1 some fluid overload effusion .  given 2  dose of 20 mg  lasix.  Patient was on IV fluids for few days which probably have led to this fluid overload in his chest.   This patient has a diagnosis of COPD he needs to use noninvasive ventilation at night and as needed during the daytime.  He is able and willing to use NIV at home.  He tolerated CPAP in the hospital.  This will prevent him from readmission to hospital as well as COPD exacerbations.  He needs to use the NIV along with current medications that I am discharging on him today.  Failure to use this over 24-hour period  Can lead to death.  #2 pleuritic chest pain with elevated troponin peaked at 0.18 with no acute EKG changes probably demand ischemia monitor closely. echocardiogram.Ejection fraction 60 to 65%.  #3 sleep apnea with obesity wife reports that he was supposed to have a sleep study done tomorrow and follow-up with pulmonary.  #4 BPH with pyuria patient asymptomatic from urology standpoint. Continue flomax         12/16/2018  Ext /u ov/Wert re: post hosp/ transition of care Chief Complaint  Patient presents with  . Shortness of Breath    Tired, coughing, some wheezing at night when sleeping   Dyspnea:    Just around the house since d/c breathing better but not pushing it yet/ off 02 Cough: daytime improving, mucus clear / tessalon helps  Sleeping: on side bed flat  SABA use:  none  02: has concentrator wearing just 2lpm hs     No obvious day to day or daytime variability or assoc   purulent sputum or mucus plugs or hemoptysis or cp or chest tightness, subjective wheeze or overt sinus or hb symptoms.   Sleeping  As above without nocturnal  or early am exacerbation  of respiratory  c/o's or need for noct saba. Also denies any obvious fluctuation of symptoms with weather or environmental changes or other aggravating or alleviating factors except as outlined above   No unusual exposure hx or h/o childhood pna/ asthma or knowledge of premature birth.  Current Allergies, Complete Past Medical History, Past Surgical History, Family History, and Social History were reviewed in [employer] record.  ROS  The following are not active complaints unless bolded Hoarseness, sore throat, dysphagia, dental problems, itching, sneezing,  nasal congestion or discharge of excess mucus or purulent secretions, ear ache,   fever, chills, sweats, unintended wt loss or wt gain, classically pleuritic or exertional cp,  orthopnea pnd or arm/hand swelling  or leg swelling, presyncope, palpitations, abdominal pain, anorexia, nausea, vomiting, diarrhea  or change in bowel habits or change in bladder habits, change in stools or change in urine, dysuria, hematuria,  rash, arthralgias, visual complaints, headache, numbness, weakness or ataxia or problems with walking or coordination,  change in mood or  memory.        Current Meds  Medication Sig  . aspirin 81 MG tablet Take 81 mg by mouth daily.   . nicotine (NICODERM CQ - DOSED IN MG/24 HOURS) 14 mg/24hr patch Place 1 patch (14 mg total) onto the skin daily.  . tamsulosin (FLOMAX) 0.4 MG CAPS capsule Take 0.4 mg by mouth.                      Past Medical History:  HYPERTENSION (ICD-V17.4)  Hx of EPIDIDYMITIS (ICD-604.90) > Left orchiectomy age 16  OBSTRUCTIVE SLEEP APNEA  (ICD-327.23)...................................Marland KitchenSood     -  Stopped   cpap 2012 on his own    - Sleep titration ordered 09/06/2012 due to excess fatigue MORBID OBESITY (ICD-278.01)  - Target wt = 208 for BMI < 30  Hyperlipidemia male, h/o hbp and borderline dm, pos fm hx= target LDL < 130  HEALTH MAINTENANCE ....................................................................  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  external ear canals moderate wax bilaterally  without cough reflex   NECK :  without JVD/Nodes/TM/ nl carotid upstrokes bilaterally   LUNGS: no acc muscle use,  Nl contour chest minimal insp/exp rhonchi  bilaterally without cough on insp or exp maneuvers   CV:  RRR  no s3 or murmur or increase in P2, and no edema   ABD:  Massively obese with poor  inspiratory excursion in the supine position. No bruits or organomegaly appreciated, bowel sounds nl  MS:  Nl gait/ ext warm without deformities, calf tenderness, cyanosis or clubbing No obvious joint restrictions   SKIN: warm and dry without lesions    NEURO:  alert, approp, nl sensorium with  no motor or cerebellar deficits  apparent.           CXR PA and Lateral:   12/16/2018 :    I personally reviewed images and agree with radiology impression as follows:   Bibasilar opacities could reflect atelectasis or infiltrates, improved since prior study. Improved small left effusion. Mild bronchitic changes.   Labs ordered(non fasting) / reviewed:      Chemistry     Glucose                     167               12/16/2018   Component Value Date/Time   NA 141 12/16/2018 1609   K 4.4 12/16/2018 1609   CL 100 12/16/2018 1609   CO2 30 12/16/2018 1609   BUN 13 12/16/2018 1609   CREATININE 0.85 12/16/2018 1609      Component Value Date/Time   CALCIUM 9.5 12/16/2018 1609   ALKPHOS 51 12/11/2018 0515   AST 18 12/11/2018 0515   ALT 17 12/11/2018 0515   BILITOT 0.5 12/11/2018 0515        Lab Results  Component Value Date   WBC 13.8 (H) 12/16/2018   HGB 13.8 12/16/2018   HCT 42.2 12/16/2018   MCV 82.6 12/16/2018   PLT 498 (H) 12/16/2018  Lab Results  Component Value Date   DDIMER 0.52 (H) 01/12/2018      Lab Results  Component Value Date   TSH 2.168 12/08/2018     Lab Results  Component Value Date   PROBNP 13.0 12/21/2007                Assessment & Plan:

## 2018-12-17 ENCOUNTER — Encounter: Payer: Self-pay | Admitting: Internal Medicine

## 2018-12-17 LAB — CBC WITH DIFFERENTIAL/PLATELET
Absolute Monocytes: 938 cells/uL (ref 200–950)
BASOS ABS: 41 {cells}/uL (ref 0–200)
Basophils Relative: 0.3 %
EOS PCT: 1.5 %
Eosinophils Absolute: 207 cells/uL (ref 15–500)
HCT: 42.2 % (ref 38.5–50.0)
Hemoglobin: 13.8 g/dL (ref 13.2–17.1)
Lymphs Abs: 1960 cells/uL (ref 850–3900)
MCH: 27 pg (ref 27.0–33.0)
MCHC: 32.7 g/dL (ref 32.0–36.0)
MCV: 82.6 fL (ref 80.0–100.0)
MPV: 10.3 fL (ref 7.5–12.5)
Monocytes Relative: 6.8 %
Neutro Abs: 10654 cells/uL — ABNORMAL HIGH (ref 1500–7800)
Neutrophils Relative %: 77.2 %
Platelets: 498 10*3/uL — ABNORMAL HIGH (ref 140–400)
RBC: 5.11 10*6/uL (ref 4.20–5.80)
RDW: 14.1 % (ref 11.0–15.0)
Total Lymphocyte: 14.2 %
WBC: 13.8 10*3/uL — ABNORMAL HIGH (ref 3.8–10.8)

## 2018-12-17 LAB — BASIC METABOLIC PANEL
BUN: 13 mg/dL (ref 7–25)
CO2: 30 mmol/L (ref 20–32)
Calcium: 9.5 mg/dL (ref 8.6–10.3)
Chloride: 100 mmol/L (ref 98–110)
Creat: 0.85 mg/dL (ref 0.70–1.33)
Glucose, Bld: 167 mg/dL — ABNORMAL HIGH (ref 65–99)
Potassium: 4.4 mmol/L (ref 3.5–5.3)
Sodium: 141 mmol/L (ref 135–146)

## 2018-12-17 NOTE — Assessment & Plan Note (Addendum)
Target wt < 208 to get BMI under 30   Note nonfasting glucose 167/ advised on diet/ ex/ and recheck fasting at next ov along with hgbA1C  Body mass index is 38.97 kg/m.  -  trending down slightly / encouraged  Lab Results  Component Value Date   TSH 2.168 12/08/2018     Contributing to gerd risk/ doe/reviewed the need and the process to achieve and maintain neg calorie balance     I had an extended discussion with the patient reviewing all relevant studies completed to date and  lasting 25 minutes of a 40  minute post hosp/ transition of care office  visit reviewing details of admit/ ways to improve healthy going forward, keep up better with flu shots yearly, and keep up with cpx due in May 2020  Also directly observed 02 sat walking study.      Each maintenance medication was reviewed in detail including most importantly the difference between maintenance and prns and under what circumstances the prns are to be triggered using an action plan format that is not reflected in the computer generated alphabetically organized AVS.    Please see AVS for specific instructions unique to this office visit that I personally wrote and verbalized to the the pt in detail and then reviewed with pt  by my nurse highlighting any changes in therapy/plan of care  recommended at today's visit.

## 2018-12-17 NOTE — Assessment & Plan Note (Signed)
Pos for Influenza A on admit 12/07/18 c/b hypoxemic RF,  rx tamiflu, rocephin, zmax  - cxr 12/16/2018 marked improvement / no desats walking    Resolved clinically and almost completely resolved radiographically, no further studies needed.

## 2018-12-17 NOTE — Assessment & Plan Note (Signed)
12/16/2018   Walked RA  2 laps @  approx 257ft each @ avg pace  stopped due to  Fatigue, no sob or desats    As of 12/16/2018 rec 2lpm hs until re-eval by Sleep medicine but no need daytime

## 2018-12-17 NOTE — Assessment & Plan Note (Addendum)
Resolving / rxmucinex dm  prn / no need for bronchodilators >>>  maintain smoking abstinence at all costs!

## 2018-12-19 ENCOUNTER — Telehealth: Payer: Self-pay | Admitting: *Deleted

## 2018-12-19 NOTE — Telephone Encounter (Signed)
-----   Message from Tanda Rockers, MD sent at 12/17/2018  6:46 AM EST ----- Keep on 2lpm bedtime  until sleep eval/ no no need during the day if sats > 90% with activities

## 2018-12-19 NOTE — Progress Notes (Signed)
LMTCB see phone note 12/19/2018

## 2018-12-19 NOTE — Telephone Encounter (Signed)
Tanda Rockers, MD sent to Rosana Berger, [place]        Call patient : Studies are ok - blood sugar was a little high but was not a fasting sample and he should be fine if he avoids sweets/ starches and loses some wt over the next few months and we'll check it again in may at cpx. If wants to come in a few weeks for fasting sugar that would be fine but not mandatory.    Tanda Rockers, MD sent to Rosana Berger, [place]        Call pt: Reviewed cxr and improving nicely, no change recs    ----- Message from Tanda Rockers, MD sent at 12/17/2018  6:46 AM EST ----- Keep on 2lpm bedtime  until sleep eval/ no no need during the day if sats > 90% with activities   LMTCB

## 2018-12-20 NOTE — Telephone Encounter (Signed)
ATC, NA and now the mailbox is full

## 2018-12-20 NOTE — Telephone Encounter (Signed)
LMTCB

## 2018-12-21 NOTE — Telephone Encounter (Signed)
Spoke with the pt and notified of all of the below recs per Dr Melvyn Novas He verbalized understanding

## 2019-01-01 ENCOUNTER — Other Ambulatory Visit: Payer: Self-pay | Admitting: Internal Medicine

## 2019-01-01 DIAGNOSIS — M7021 Olecranon bursitis, right elbow: Secondary | ICD-10-CM

## 2019-01-02 ENCOUNTER — Telehealth: Payer: Self-pay | Admitting: Internal Medicine

## 2019-01-02 MED ORDER — MELOXICAM 7.5 MG PO TABS: 7.5000 mg | ORAL_TABLET | Freq: Two times a day (BID) | ORAL | 0 refills | Status: DC

## 2019-01-02 NOTE — Telephone Encounter (Signed)
Called and spoke with Patients Wife, [name] [name].  She stated that she had called pharmacy for a refill for Meloxicam, and was told that [facility] would have to have new prescription. She requested a refill to be sent to [facility]. Meloxicam is not on current patient med list.  Last OV 12/16/18 with Dr Melvyn Novas-     Instructions   Continue 02 2lpm at bedtime and no smoking   For cough > mucinex dm 1200 mg every 12 hours as needed    Please remember to go to the lab and x-ray department   for your tests - we will call you with the results when they are available.     Goal for 02 is to keep your saturations > 90%  Late add Keep on 2lpm bedtime  until sleep eval/ no no need during the day if sats > 90% with activities      Keep your appt for a sleep study and I will see you Mar 25 2019 for cpx - call sooner if needed  Late add  recheck fasting at next ov along with hgbA1C      Message routed to Dr Melvyn Novas

## 2019-01-02 NOTE — Telephone Encounter (Signed)
LMOM that script of mobic was sent to the preferred pharmacy.  Nothing further is needed.

## 2019-01-02 NOTE — Telephone Encounter (Signed)
mobic 7. 5 mg bid prn joint pain # 60

## 2019-01-03 NOTE — Telephone Encounter (Signed)
Dr. Wert, please advise if it is okay to refill med for pt. Thanks! 

## 2019-01-08 ENCOUNTER — Encounter

## 2019-01-08 ENCOUNTER — Ambulatory Visit (HOSPITAL_BASED_OUTPATIENT_CLINIC_OR_DEPARTMENT_OTHER): Payer: BC Managed Care – PPO | Attending: Pulmonary Disease | Admitting: Pulmonary Disease

## 2019-01-08 VITALS — Ht 71.0 in | Wt 279.0 lb

## 2019-01-08 DIAGNOSIS — G4733 Obstructive sleep apnea (adult) (pediatric): Secondary | ICD-10-CM | POA: Diagnosis not present

## 2019-01-11 ENCOUNTER — Telehealth: Payer: Self-pay | Admitting: Pulmonary Disease

## 2019-01-11 NOTE — Telephone Encounter (Signed)
Sleep study result  Date of study 01/08/2019  Impression: Severe obstructive sleep apnea  Recommendation: Trial of CPAP therapy on 19 cm H2O with a Large size Resmed Full Face Mask AirFit F20 mask and heated humidification.  Follow-up in the office 1 to 3 months following initiation of CPAP therapy

## 2019-01-11 NOTE — Procedures (Signed)
POLYSOMNOGRAPHY  Last, First: [name], [name] MRN: [id number] Gender: Male Age (years): 58 Weight (lbs): 279 DOB: December 06, 1960 BMI: 39 Primary Care: Tanda Rockers Epworth Score: 8 Referring: Laurin Coder MD Technician: Lanae Boast Interpreting: Laurin Coder MD Study Type: Split Night CPAP Ordered Study Type: Split Night CPAP Study date: 01/08/2019 Location: [place] CLINICAL INFORMATION [name] [name] is a 58 year old Male and was referred to the sleep center for evaluation of N/A. Indications include OSA.  MEDICATIONS Patient self administered medications include: N/A. Medications administered during study include No sleep medicine administered.  SLEEP STUDY TECHNIQUE The patient underwent an attended overnight level one polysomnography titration to assess the effects of CPAP therapy. The following variables were monitored: EEG (C4-A1, C3-A2, O1-A2, O2-A1), EOG, submental and leg EMG, ECG, oxyhemoglobin saturation by pulse oximetry, thoracic and abdominal respiratory effort belts, nasal/oral airflow by pressure sensor, body position sensor and snoring sensor. CPAP pressure was titrated to eliminate apneas, hypopneas and oxygen desaturation. Hypopneas were scored per AASM definition IB (4% desaturation)  The NPSG portion of the study ended at 1:59:57 AM . The CPAP titration was initiated at 2:02:00 AM AM with the CPAP portion of the study ending at 5:31:49 AM.  TECHNICIAN COMMENTS Comments added by Technician: Patient tolerated CPAP well Comments added by Scorer: N/A SLEEP ARCHITECTURE The recording time for the entire night was 381.6 minutes. The diagnostic portion was initiated at 11:10:16 PM and terminated at 1:59:57 AM. The time in bed was 169.7 minutes. EEG confirmed total sleep time was 127.5 minutes yielding a sleep efficiency of 75.1%. Sleep onset after lights out was 3.6 minutes with a REM latency of 84.0 minutes. The patient spent 37.65% of the night  in stage N1 sleep, 53.33% in stage N2 sleep, 0.00% in stage N3 and 9% in REM. The Arousal Index was 106.4/hour.  The titration portion was initiated at 2:02:00 AM and terminated at 5:31:49 AM. The time in bed was 209.8 minutes. EEG confirmed total sleep time was 187.9 minutes yielding a sleep efficiency of 89.6%. Sleep onset after CPAP initiation was 6.9 minutes with a REM latency of 30.0 minutes. The patient spent 13.30% of the night in stage N1 sleep, 47.86% in stage N2 sleep, 0.00% in stage N3 and 38.8% in REM. The Arousal Index was 25.5/hour. RESPIRATORY PARAMETERS During the diagnostic portion, there were a total of 197 respiratory disturbances recorded; 94 apneas ( 92 obstructive, 0 mixed, 2 central), 102 hypopneas and 1 RERAs. The apnea/hypopnea index 92.2 was events/hour and the RDI was 92.7 events/hour. The central sleep apnea index was 0.9 events/hour. The REM AHI was 120.0 /h and NREM AHI was 90.0/h. The REM RDI was 120.0 /h and NREM RDI was 90.5 /h. The supine AHI was N/A/h, and the non supine AHI was 92.2/h; supine during 0.00% of sleep. The supine RDI was 0.0/h, and the non supine RDI was 92.71/h. Respiratory disturbances were associated with oxygen desaturation down to a nadir of 55.00 % during sleep. The mean oxygen saturation during the study was 81.59%. The cumulative time under 88% oxygen saturation was 99.9 minutes.  During the titration portion, the apnea/hypopnea index (AHI) was 18.2 events/hour and the RDI was 20.8 events/hour. The central sleep apnea index was 0.0 events/hour. The most appropriate setting of CPAP was IPAP/EPAP 23/23 cm H2O. At this setting, the sleep efficiency was 100% and the patient was supine for 100%. The AHI was 0.0 events per hour(with 0 central events). Oxygen nadir was 88.0. LEG MOVEMENT DATA  The periodic limb movement index was 0.00/hour with an associated arousal index of /hour. CARDIAC DATA The underlying cardiac rhythm was most consistent with sinus  rhythm. Mean heart rate was 75.34 during diagnostic portion and 82.04 during titration portion of study. Additional rhythm abnormalities include None.   IMPRESSIONS - Severe Obstructive Sleep apnea(OSA) Optimal pressure attained. - EKG showed no cardiac abnormalities. - No Significant Central Sleep Apnea (CSA) - Severe Oxygen Desaturation - The patient snored with loud snoring volume. - EEG did not show alpha intrusion. - No significant periodic leg movements(PLMs) during sleep. No significant associated arousals. - Reduced sleep efficiency, short primary sleep latency, normal REM sleep latency and no slow wave latency.   DIAGNOSIS - Obstructive Sleep Apnea (327.23 [G47.33 ICD-10])   RECOMMENDATIONS - Trial of CPAP therapy on 19 cm H2O with a Large size Resmed Full Face Mask AirFit F20 mask and heated humidification. - Avoid alcohol, sedatives and other CNS depressants that may worsen sleep apnea and disrupt normal sleep architecture. - Sleep hygiene should be reviewed to assess factors that may improve sleep quality. - Weight management and regular exercise should be initiated or continued. - Return to Sleep Center for re-evaluation after 4 weeks of therapy  [Electronically signed] 01/11/2019 07:40 PM  Sherrilyn Rist MD NPI: [id number]

## 2019-01-20 NOTE — Telephone Encounter (Signed)
lmom 

## 2019-01-25 ENCOUNTER — Telehealth: Payer: Self-pay | Admitting: Pulmonary Disease

## 2019-01-25 DIAGNOSIS — G4733 Obstructive sleep apnea (adult) (pediatric): Secondary | ICD-10-CM

## 2019-01-25 NOTE — Telephone Encounter (Signed)
Sleep study result  Date of study 01/08/2019  Impression: Severe obstructive sleep apnea  Recommendation: Trial of CPAP therapy on 19 cm H2O with a Large size Resmed Full Face Mask AirFit F20 mask and heated humidification.  Follow-up in the office 1 to 3 months following initiation of CPAP therapy  LMTCB at both numbers given

## 2019-01-25 NOTE — Telephone Encounter (Signed)
Pt is calling back [phone]

## 2019-01-25 NOTE — Telephone Encounter (Signed)
Spoke with patient. He is aware of results. Will go ahead and place order for CPAP machine. Nothing further needed at time of call.

## 2019-01-27 ENCOUNTER — Other Ambulatory Visit: Payer: Self-pay | Admitting: Internal Medicine

## 2019-02-06 ENCOUNTER — Telehealth: Payer: Self-pay | Admitting: Internal Medicine

## 2019-02-06 DIAGNOSIS — G4733 Obstructive sleep apnea (adult) (pediatric): Secondary | ICD-10-CM

## 2019-02-06 NOTE — Telephone Encounter (Signed)
Patient is aware of this and we will await fax for compliance to  Have provider follow up.

## 2019-02-06 NOTE — Telephone Encounter (Signed)
Patient is returning phone call.  Patient phone number is [phone].

## 2019-02-06 NOTE — Telephone Encounter (Signed)
I spoke with [employer] and they have not gotten him switched over to their system yet. The service rep said she would add him and fax over a compliance report so the doctor can review the report.   I called the pt but he did not answer and I left a voicemail for him to call back. Will await return call.

## 2019-02-07 NOTE — Telephone Encounter (Signed)
Make sure he has appt with Dr Ander Slade when he's seeing pts in office again

## 2019-02-07 NOTE — Telephone Encounter (Signed)
Dr Judson Roch schedule for July 2020 has not been opened yet  Reminder was placed for appt to be set up for then  Before we send the order to auto CPAP settings we need a pressure range  Please advise thanks

## 2019-02-07 NOTE — Telephone Encounter (Signed)
I have not seen a report come over. Magda Paganini please advise if a report has been given to you? Thank you.

## 2019-02-07 NOTE — Telephone Encounter (Signed)
The order has been sent to DME today by Raquel Sarna Pt is aware of the order being sent today. Nothing further is needed at this time.

## 2019-02-07 NOTE — Telephone Encounter (Signed)
I had rec cpap 5-20 auto set and Dr Ander Slade agreed/ all further questions re settings should be directed to him

## 2019-02-07 NOTE — Telephone Encounter (Signed)
Have located pt in [place]. Called and spoke with pt to see when he actually started on CPAP and per pt, he stated that he began it Friday, 3/27 so he has only worn the machine so far two nights. Pt stated he does feel like the pressure is too much as he has had issues with his throat being sore and dry.  Pt states due to the machine blowing so hard, he is not going to be able to wear it with that much pressure and states he needs to have something adjusted. Pt also stated that he has had leaks due to the pressure and states that he has had to keep adjusting the mask due to this.  I have printed download for MW to review and am routing encounter to MW as well.

## 2019-02-07 NOTE — Telephone Encounter (Signed)
Okay to switch to auto-CPAP. Patient will get optimal treatment and lower pressures  One nights study may not necessarily typify what the patient does on most nights, it is possible to over titrate based on what the patient is doing on the night of the study. An autotitrating setup helps with that. Fixed pressure is usually set for worst case scenario.

## 2019-02-07 NOTE — Telephone Encounter (Signed)
Order has been placed per MW with the setting change stated. Also routing this to Dr. Ander Slade per MW

## 2019-02-07 NOTE — Telephone Encounter (Signed)
Send this message to Dr Judson Roch attention to see if he has any additional recs - he read the test and my intention was for [place] to set up a f/u for pt with him.  In meantime ok to switch it over to autocpap 5-20 as some is better than none and wear it as much as he can

## 2019-02-07 NOTE — Telephone Encounter (Signed)
I just checked up front and have received nothing on this pt

## 2019-02-08 NOTE — Telephone Encounter (Signed)
Patient made aware please see phone note.

## 2019-03-27 ENCOUNTER — Ambulatory Visit: Payer: BC Managed Care – PPO | Admitting: Internal Medicine

## 2019-05-25 ENCOUNTER — Encounter: Payer: Self-pay | Admitting: Internal Medicine

## 2019-05-25 ENCOUNTER — Ambulatory Visit (INDEPENDENT_AMBULATORY_CARE_PROVIDER_SITE_OTHER): Payer: BC Managed Care – PPO | Admitting: Internal Medicine

## 2019-05-25 ENCOUNTER — Other Ambulatory Visit: Payer: Self-pay

## 2019-05-25 ENCOUNTER — Ambulatory Visit: Payer: Self-pay | Admitting: Pulmonary Disease

## 2019-05-25 DIAGNOSIS — F17210 Nicotine dependence, cigarettes, uncomplicated: Secondary | ICD-10-CM

## 2019-05-25 DIAGNOSIS — R739 Hyperglycemia, unspecified: Secondary | ICD-10-CM | POA: Insufficient documentation

## 2019-05-25 DIAGNOSIS — Z23 Encounter for immunization: Secondary | ICD-10-CM | POA: Diagnosis not present

## 2019-05-25 LAB — BASIC METABOLIC PANEL
BUN: 13 mg/dL (ref 6–23)
CO2: 29 mEq/L (ref 19–32)
Calcium: 9.3 mg/dL (ref 8.4–10.5)
Chloride: 105 mEq/L (ref 96–112)
Creatinine, Ser: 0.85 mg/dL (ref 0.40–1.50)
GFR: 111.46 mL/min (ref >60.00)
Glucose, Bld: 92 mg/dL (ref 70–99)
Potassium: 4.4 mEq/L (ref 3.5–5.1)
Sodium: 139 mEq/L (ref 135–145)

## 2019-05-25 LAB — HEMOGLOBIN A1C: Hgb A1c MFr Bld: 6.3 % (ref 4.6–6.5)

## 2019-05-25 NOTE — Patient Instructions (Addendum)
The key is to stop smoking completely before smoking completely stops you!  Weight control is simply a matter of calorie balance which needs to be tilted in your favor by eating less and exercising more.  To get the most out of exercise, you need to be continuously aware that you are short of breath, but never out of breath, for 30 minutes daily. As you improve, it will actually be easier for you to do the same amount of exercise  in  30 minutes so always push to the level where you are short of breath.  If this does not result in gradual weight reduction then I strongly recommend you see a nutritionist with a food diary x 2 weeks so that we can work out a negative calorie balance which is universally effective in steady weight loss programs.  Think of your calorie balance like you do your bank account where in this case you want the balance to go down so you must take in less calories than you burn up.  It's just that simple:  Hard to do, but easy to understand.  Good luck!    Please remember to go to the lab department   for your tests - we will call you with the results when they are available.     Prevnar 13 today    Please schedule a follow up visit in 3 months but call sooner if needed - cpx on return

## 2019-05-25 NOTE — Progress Notes (Signed)
Spoke with pt and notified of results per Dr. Wert. Pt verbalized understanding and denied any questions. 

## 2019-05-25 NOTE — Progress Notes (Signed)
Subjective:    Patient ID: [name] [name], male    DOB: 05/21/1961   MRN: [id number]  Brief patient profile:  58 yobm with morbid obesity active smoker with documented sleep apnea for which he has seen Dr. Halford Chessman with no improvement in his weight over the despite previous extensive counseling regarding calorie balance issues     History of Present Illness   11/16/2017  Acute ov/Espen Bethel re:  DJD/ new acute cough x 10 days  Chief Complaint  Patient presents with  . Acute Visit    Appt scheduled to get refill on meloxicam for hip pain. He states he has had a cold for the past wk. He c/o runny nose, cough and HA. His cough is mainly non prod but he will occ produce some clear sputum. He has an albuterol inhaler but it's expired and he has not really felt like he needs it.   noct cough /wheeze  mucus clear since acute  onset of uri x one week with rhinorrhea, has used saba in past prn but rx ran out/ rarely needs despite ongoing smoking  rec For cough/ congestion > mucinex dm up to 1200 mg every 12 hours as needed  zpak  For wheeze/ short of breath >> albuterol inhaler up to 2 puff every 4 hours as needed  If not improving > Prednisone 10 mg take  4 each am x 2 days,   2 each am x 2 days,  1 each am x 2 days and stop    03/24/2018  f/u ov/Imagene Boss re:  Cpx/ cough never > 50% better, never filled rx for  albuterol /prednisone  Chief Complaint  Patient presents with  . Annual Exam    Pt is fasting. He is c/o cough and nasal congestion for the past wk. His cough is non prod. He has had some green nasal d/c.   Dyspnea:   Not limited by breathing from desired activities   Cough: worse immediately when   w/in 5 min rhs arely takes mucinex dm  Sleep: was ok until 2 weeks prior to OV   SABA use:   Never purchased   zpak rx by [facility] 03/18/18 some better  Nasal obst / congestion/ usually clear  X sev years rec For cough > mucinex dm up to 1200 mg every 12 hours as needed  Ear wax remover is over the  counter  Add pepcid 20 mg after supper until next office visit GERD . Augmentin 875 mg take one pill twice daily  X 10 days - take at breakfast and supper with large glass of water.  It would help reduce the usual side effects (diarrhea and yeast infections) if you ate cultured yogurt at lunch.  Please see patient coordinator before you leave today  to schedule sinus CT in 2 weeks no sooner Please remember to go to the lab and x-ray department downstairs in the basement  for your tests - we will call you with the results when they are available.   Please schedule a follow up office visit in 6 weeks, call sooner if needed with all medications /inhalers/ solutions in hand so we can verify exactly what you are taking. This includes all medications from all doctors and over the counters  - PFTs on return  - Prevnar 13 on return    05/06/2018  f/u ov/Anthea Udovich re: obesity / smoking  Chief Complaint  Patient presents with  . Follow-up    PFT's done today. Cough has resolved.  Dyspnea:  Not limited by breathing from desired activities   Cough: resolved  Sleeping: ok flat rec Ear wax remover over the counter then see [name]  if not better   Prevnar 13 today  Although I don't endorse regular use of e cigs/ many pts find them helpful; however, I emphasized they should be considered a "one-way bridge" off all tobacco products.    07/15/18 [name] eval for osa rec History of obstructive sleep apnea Worsening symptoms, increasing weight gain, nonrestorative sleep Will order a split-night study Encouraged about weight loss efforts    05/25/2019  f/u ov/Rhianna Raulerson re:  MO/ borderline dm / still smoking  Chief Complaint  Patient presents with  . Annual Exam    Pt is fasting. Still has occ cough.   Dyspnea:  Walking stopped since got hot  Cough: none Sleeping: on cpap flat ok  SABA use: none  02: none now   No obvious day to day or daytime variability or assoc excess/ purulent sputum or mucus plugs  or hemoptysis or cp or chest tightness, subjective wheeze or overt sinus or hb symptoms.   Sleeping as above  without nocturnal  or early am exacerbation  of respiratory  c/o's or need for noct saba. Also denies any obvious fluctuation of symptoms with weather or environmental changes or other aggravating or alleviating factors except as outlined above   No unusual exposure hx or h/o childhood pna/ asthma or knowledge of premature birth.  Current Allergies, Complete Past Medical History, Past Surgical History, Family History, and Social History were reviewed in [employer] record.  ROS  The following are not active complaints unless bolded Hoarseness, sore throat, dysphagia, dental problems, itching, sneezing,  nasal congestion or discharge of excess mucus or purulent secretions, ear ache,   fever, chills, sweats, unintended wt loss or wt gain, classically pleuritic or exertional cp,  orthopnea pnd or arm/hand swelling  or leg swelling, presyncope, palpitations, abdominal pain, anorexia, nausea, vomiting, diarrhea  or change in bowel habits or change in bladder habits, change in stools or change in urine, dysuria, hematuria,  rash, arthralgias, visual complaints, headache, numbness, weakness or ataxia or problems with walking or coordination,  change in mood or  memory.        Current Meds  Medication Sig  . aspirin 81 MG tablet Take 81 mg by mouth daily as needed.   [name] dextromethorphan-guaiFENesin (MUCINEX DM) 30-600 MG 12hr tablet Take 1 tablet by mouth 2 (two) times daily.  . meloxicam (MOBIC) 7.5 MG tablet TAKE 1 TABLET (7.5 MG TOTAL) BY MOUTH 2 (TWO) TIMES DAILY. (Patient taking differently: Take 7.5 mg by mouth 2 (two) times daily as needed. )  . tamsulosin (FLOMAX) 0.4 MG CAPS capsule Take 0.4 mg by mouth.                     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 Nl external ear canals without cough reflex   NECK :  without JVD/Nodes/TM/ nl carotid upstrokes bilaterally   LUNGS: no acc muscle use,  Nl contour chest which is clear to A and P bilaterally without cough on insp or exp maneuvers   CV:  RRR  no s3 or murmur or increase in P2, and no edema   ABD:  Obese/ soft and nontender with nl inspiratory excursion in the supine position. No bruits or organomegaly appreciated, bowel sounds nl  MS:  Nl gait/ ext warm without deformities, calf tenderness, cyanosis or clubbing No obvious joint restrictions   SKIN: warm and dry without lesions    NEURO:  alert, approp, nl sensorium with  no motor or  cerebellar deficits apparent.      Chemistry      Component Value Date/Time   NA 139 05/25/2019 1029   K 4.4 05/25/2019 1029   CL 105 05/25/2019 1029   CO2 29 05/25/2019 1029   BUN 13 05/25/2019 1029   CREATININE 0.85 05/25/2019 1029   CREATININE 0.85 12/16/2018 1609      Component Value Date/Time   CALCIUM 9.3 05/25/2019 1029   ALKPHOS 51 12/11/2018 0515   AST 18 12/11/2018 0515   ALT 17 12/11/2018 0515   BILITOT 0.5 12/11/2018 0515       Fasting glucose 05/25/2019  = 92  Lab Results  Component Value Date   HGBA1C 6.3 05/25/2019                         Assessment & Plan:

## 2019-05-26 ENCOUNTER — Ambulatory Visit: Payer: BC Managed Care – PPO | Admitting: Internal Medicine

## 2019-05-26 ENCOUNTER — Encounter: Payer: Self-pay | Admitting: Internal Medicine

## 2019-05-26 NOTE — Assessment & Plan Note (Signed)
-   PFT's  05/06/2018  FEV1 1.99 (60 % ) ratio 85  p 5 % improvement from saba p nothing prior to study with DLCO  60/60c % corrects to 112 % for alv volume    Counseled re importance of smoking cessation but did not meet time criteria for separate billing

## 2019-05-26 NOTE — Assessment & Plan Note (Addendum)
Target wt < 208 to get BMI under 30    - Referred to nutrition 09/06/2012    Body mass index is 40.42 kg/m.  -  trending back up  Lab Results  Component Value Date   TSH 2.168 12/08/2018     Contributing to gerd risk/ doe/reviewed the need and the process to achieve and maintain neg calorie balance  - see avs for instructions unique to this ov     Needs cpx by end of year   I had an extended discussion with the patient reviewing all relevant studies completed to date and  lasting 15 to 20 minutes of a 25 minute visit    See device teaching which extended face to face time for this visit.  Each maintenance medication was reviewed in detail including emphasizing most importantly the difference between maintenance and prns and under what circumstances the prns are to be triggered using an action plan format that is not reflected in the computer generated alphabetically organized AVS which I have not found useful in most complex patients, especially with respiratory illnesses  Please see AVS for specific instructions unique to this visit that I personally wrote and verbalized to the the pt in detail and then reviewed with pt  by my nurse highlighting any  changes in therapy recommended at today's visit to their plan of care.

## 2019-05-26 NOTE — Assessment & Plan Note (Signed)
Resolved with nl hgba1c but clearly at risk esp with acute illness in future   Pt informed of the seriousness of COVID 19 infection as a direct risk to their health  and safey and to those of their loved ones and should continue to wear facemask in public and minimize exposure to public locations but especially avoid any area or activity where non-close contacts are not observing distancing or wearing an appropriate face mask.

## 2019-06-01 ENCOUNTER — Telehealth: Payer: Self-pay | Admitting: Internal Medicine

## 2019-06-01 NOTE — Telephone Encounter (Signed)
Returned call to patient.  States his ears felt a little stopped up this morning.  He had seen T. Parrett, NP in the past to have them cleaned out.  Checked with Parke Poisson who works with T. Royal Piedra and advised currently we are not doing this in the office due to equipment problem and no immediate plan to resume this.  Suggested patient call PCP who may be able to assist or an urgent care facility to inquire.  Patient acknowledged understanding.  Nothing further needed.

## 2019-06-04 ENCOUNTER — Other Ambulatory Visit: Payer: Self-pay | Admitting: Internal Medicine

## 2019-08-25 ENCOUNTER — Ambulatory Visit (INDEPENDENT_AMBULATORY_CARE_PROVIDER_SITE_OTHER): Payer: BC Managed Care – PPO | Admitting: Internal Medicine

## 2019-08-25 ENCOUNTER — Other Ambulatory Visit: Payer: Self-pay

## 2019-08-25 ENCOUNTER — Encounter: Payer: Self-pay | Admitting: Internal Medicine

## 2019-08-25 DIAGNOSIS — Z23 Encounter for immunization: Secondary | ICD-10-CM | POA: Diagnosis not present

## 2019-08-25 DIAGNOSIS — Z0000 Encounter for general adult medical examination without abnormal findings: Secondary | ICD-10-CM

## 2019-08-25 DIAGNOSIS — R739 Hyperglycemia, unspecified: Secondary | ICD-10-CM

## 2019-08-25 DIAGNOSIS — F17210 Nicotine dependence, cigarettes, uncomplicated: Secondary | ICD-10-CM

## 2019-08-25 DIAGNOSIS — G4733 Obstructive sleep apnea (adult) (pediatric): Secondary | ICD-10-CM

## 2019-08-25 LAB — HEPATIC FUNCTION PANEL
ALT: 26 U/L (ref 0–53)
AST: 20 U/L (ref 0–37)
Albumin: 4.2 g/dL (ref 3.5–5.2)
Alkaline Phosphatase: 86 U/L (ref 39–117)
Bilirubin, Direct: 0.1 mg/dL (ref 0.0–0.3)
Total Bilirubin: 0.4 mg/dL (ref 0.2–1.2)
Total Protein: 7.4 g/dL (ref 6.0–8.3)

## 2019-08-25 LAB — LIPID PANEL
Cholesterol: 163 mg/dL (ref 0–200)
HDL: 26.4 mg/dL — ABNORMAL LOW (ref >39.00)
LDL Cholesterol: 108 mg/dL — ABNORMAL HIGH (ref 0–99)
NonHDL: 136.95
Total CHOL/HDL Ratio: 6
Triglycerides: 147 mg/dL (ref 0.0–149.0)
VLDL: 29.4 mg/dL (ref 0.0–40.0)

## 2019-08-25 LAB — CBC WITH DIFFERENTIAL/PLATELET
Basophils Absolute: 0.1 10*3/uL (ref 0.0–0.1)
Basophils Relative: 0.5 % (ref 0.0–3.0)
Eosinophils Absolute: 0.3 10*3/uL (ref 0.0–0.7)
Eosinophils Relative: 2.4 % (ref 0.0–5.0)
HCT: 42.7 % (ref 39.0–52.0)
Hemoglobin: 13.8 g/dL (ref 13.0–17.0)
Lymphocytes Relative: 18.5 % (ref 12.0–46.0)
Lymphs Abs: 2.1 10*3/uL (ref 0.7–4.0)
MCHC: 32.2 g/dL (ref 30.0–36.0)
MCV: 84.4 fl (ref 78.0–100.0)
Monocytes Absolute: 0.8 10*3/uL (ref 0.1–1.0)
Monocytes Relative: 7.2 % (ref 3.0–12.0)
Neutro Abs: 8.1 10*3/uL — ABNORMAL HIGH (ref 1.4–7.7)
Neutrophils Relative %: 71.4 % (ref 43.0–77.0)
Platelets: 292 10*3/uL (ref 150.0–400.0)
RBC: 5.06 Mil/uL (ref 4.22–5.81)
RDW: 15 % (ref 11.5–15.5)
WBC: 11.3 10*3/uL — ABNORMAL HIGH (ref 4.0–10.5)

## 2019-08-25 LAB — HEMOGLOBIN A1C: Hgb A1c MFr Bld: 6.7 % — ABNORMAL HIGH (ref 4.6–6.5)

## 2019-08-25 LAB — BASIC METABOLIC PANEL
BUN: 10 mg/dL (ref 6–23)
CO2: 29 mEq/L (ref 19–32)
Calcium: 9.4 mg/dL (ref 8.4–10.5)
Chloride: 103 mEq/L (ref 96–112)
Creatinine, Ser: 0.83 mg/dL (ref 0.40–1.50)
GFR: 114.46 mL/min (ref >60.00)
Glucose, Bld: 99 mg/dL (ref 70–99)
Potassium: 4.3 mEq/L (ref 3.5–5.1)
Sodium: 138 mEq/L (ref 135–145)

## 2019-08-25 LAB — TSH: TSH: 1.52 u[IU]/mL (ref 0.35–4.50)

## 2019-08-25 NOTE — Patient Instructions (Addendum)
Follow up yearly with your urologist (his name will be on your flomax bottle)   Weight control is simply a matter of calorie balance which needs to be tilted in your favor by eating less and exercising more.  To get the most out of exercise, you need to be continuously aware that you are short of breath, but never out of breath, for 30 minutes daily. As you improve, it will actually be easier for you to do the same amount of exercise  in  30 minutes so always push to the level where you are short of breath.  If this does not result in gradual weight reduction then I strongly recommend you see a nutritionist with a food diary x 2 weeks so that we can work out a negative calorie balance which is universally effective in steady weight loss programs.  Think of your calorie balance like you do your bank account where in this case you want the balance to go down so you must take in less calories than you burn up.  It's just that simple:  Hard to do, but easy to understand.  Good luck!    Please remember to go to the lab department   for your tests - we will call you with the results when they are available.   Make appt to see Dr Ander Slade re your cpap and [name]  re:  Low dose CT chest Chest       Please schedule a follow up visit in 6  months but call sooner if needed

## 2019-08-25 NOTE — Progress Notes (Signed)
Subjective:    Patient ID: [name] [name], male    DOB: 05/21/1961   MRN: [id number]  Brief patient profile:  42 yobm with morbid obesity active smoker with documented sleep apnea for which he has seen Dr. Halford Chessman with no improvement in his weight over the despite previous extensive counseling regarding calorie balance issues     History of Present Illness   11/16/2017  Acute ov/Azyiah Bo re:  DJD/ new acute cough x 10 days  Chief Complaint  Patient presents with  . Acute Visit    Appt scheduled to get refill on meloxicam for hip pain. He states he has had a cold for the past wk. He c/o runny nose, cough and HA. His cough is mainly non prod but he will occ produce some clear sputum. He has an albuterol inhaler but it's expired and he has not really felt like he needs it.   noct cough /wheeze  mucus clear since acute  onset of uri x one week with rhinorrhea, has used saba in past prn but rx ran out/ rarely needs despite ongoing smoking  rec For cough/ congestion > mucinex dm up to 1200 mg every 12 hours as needed  zpak  For wheeze/ short of breath >> albuterol inhaler up to 2 puff every 4 hours as needed  If not improving > Prednisone 10 mg take  4 each am x 2 days,   2 each am x 2 days,  1 each am x 2 days and stop    03/24/2018  f/u ov/Waller Marcussen re:  Cpx/ cough never > 50% better, never filled rx for  albuterol /prednisone  Chief Complaint  Patient presents with  . Annual Exam    Pt is fasting. He is c/o cough and nasal congestion for the past wk. His cough is non prod. He has had some green nasal d/c.   Dyspnea:   Not limited by breathing from desired activities   Cough: worse immediately when   w/in 5 min rhs arely takes mucinex dm  Sleep: was ok until 2 weeks prior to OV   SABA use:   Never purchased   zpak rx by [facility] 03/18/18 some better  Nasal obst / congestion/ usually clear  X sev years rec For cough > mucinex dm up to 1200 mg every 12 hours as needed  Ear wax remover is over the  counter  Add pepcid 20 mg after supper until next office visit GERD . Augmentin 875 mg take one pill twice daily  X 10 days - take at breakfast and supper with large glass of water.  It would help reduce the usual side effects (diarrhea and yeast infections) if you ate cultured yogurt at lunch.  Please see patient coordinator before you leave today  to schedule sinus CT in 2 weeks no sooner Please remember to go to the lab and x-ray department downstairs in the basement  for your tests - we will call you with the results when they are available.   Please schedule a follow up office visit in 6 weeks, call sooner if needed with all medications /inhalers/ solutions in hand so we can verify exactly what you are taking. This includes all medications from all doctors and over the counters  - PFTs on return  - Prevnar 13 on return    05/06/2018  f/u ov/Terriann Difonzo re: obesity / smoking  Chief Complaint  Patient presents with  . Follow-up    PFT's done today. Cough has resolved.  Dyspnea:  Not limited by breathing from desired activities   Cough: resolved  Sleeping: ok flat rec Ear wax remover over the counter then see [name]  if not better   Prevnar 13 today  Although I don't endorse regular use of e cigs/ many pts find them helpful; however, I emphasized they should be considered a "one-way bridge" off all tobacco products.    07/15/18 [name] eval for osa rec History of obstructive sleep apnea Worsening symptoms, increasing weight gain, nonrestorative sleep Will order a split-night study Encouraged about weight loss efforts    05/25/2019  f/u ov/Staria Birkhead re:  MO/ borderline dm / still smoking  Chief Complaint  Patient presents with    F/u ov     Pt is fasting. Still has occ cough.   Dyspnea:  Walking stopped since got hot  Cough: none Sleeping: on cpap flat ok  rec The key is to stop smoking completely before smoking completely stops you! Weight control is simply a matter of calorie  balance    08/25/2019  f/u ov/Tyress Loden re: MO/ borderline DM / still smoking Chief Complaint  Patient presents with  . Annual Exam    Pt is not fasting. He is concerned about weight gain.   Dyspnea:  Not walking since summer/L hip pain can be limiting  but usually resolves p one mobic after supper  Cough:  no Sleeping: on cpap per [name]/ doing better but has not been seen him in f/u and not tolerating  cpap at levels rec SABA use: no 02: none    No obvious day to day or daytime variability or assoc excess/ purulent sputum or mucus plugs or hemoptysis or cp or chest tightness, subjective wheeze or overt sinus or hb symptoms.   Sleeping ok  without nocturnal  or early am exacerbation  of respiratory  c/o's or need for noct saba. Also denies any obvious fluctuation of symptoms with weather or environmental changes or other aggravating or alleviating factors except as outlined above   No unusual exposure hx or h/o childhood pna/ asthma or knowledge of premature birth.  Current Allergies, Complete Past Medical History, Past Surgical History, Family History, and Social History were reviewed in [employer] record.  ROS  The following are not active complaints unless bolded Hoarseness, sore throat, dysphagia, dental problems, itching, sneezing,  nasal congestion or discharge of excess mucus or purulent secretions, ear ache,   fever, chills, sweats, unintended wt loss or wt gain, classically pleuritic or exertional cp,  orthopnea pnd or arm/hand swelling  or leg swelling, presyncope, palpitations, abdominal pain, anorexia, nausea, vomiting, diarrhea  or change in bowel habits or change in bladder habits, change in stools or change in urine, dysuria, hematuria,  rash, arthralgias, visual complaints, headache, numbness, weakness or ataxia or problems with walking or coordination,  change in mood or  memory.        Current Meds  Medication Sig  . Ascorbic Acid (VITAMIN C) 100  MG tablet Take 100 mg by mouth daily.  [name] aspirin 81 MG tablet Take 81 mg by mouth daily as needed.   . cholecalciferol (VITAMIN D3) 25 MCG (1000 UT) tablet Take 1,000 Units by mouth daily.  . cyanocobalamin 1000 MCG tablet Take 1,000 mcg by mouth daily.  [name] dextromethorphan-guaiFENesin (MUCINEX DM) 30-600 MG 12hr tablet Take 1 tablet by mouth 2 (two) times daily.  . meloxicam (MOBIC) 7.5 MG tablet Take 1 tablet (7.5 mg total) by mouth 2 (two) times  daily as needed.  . tamsulosin (FLOMAX) 0.4 MG CAPS capsule Take 0.4 mg by mouth.  . zinc gluconate 50 MG tablet Take 50 mg by mouth daily.               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  [place]   cpap 2012 on his own    - Sleep titration ordered 09/06/2012 due to excess fatigue MORBID OBESITY (ICD-278.01)  - Target wt = 208 for BMI < 30  Hyperlipidemia male, h/o hbp and borderline dm, pos fm hx= target LDL < 130  HEALTH MAINTENANCE ....................................................................  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    NECK :  without JVD/Nodes/TM/ nl carotid upstrokes bilaterally   LUNGS: no acc muscle use,  Nl contour chest which is clear to A and P bilaterally without cough on insp or exp maneuvers   CV:  RRR  no s3 or murmur or increase in P2, and no edema   ABD:  soft and nontender with nl inspiratory excursion in the supine position. No bruits or organomegaly appreciated, bowel sounds nl  MS:  Nl gait/ ext warm without deformities, calf tenderness, cyanosis or clubbing No obvious joint restrictions   SKIN: warm and dry without lesions    NEURO:  alert, approp, nl sensorium with  no motor or cerebellar deficits apparent.        Labs ordered/ reviewed:      Chemistry      Component Value Date/Time   NA 138 08/25/2019 0930   K 4.3 08/25/2019 0930   CL 103 08/25/2019 0930   CO2 29 08/25/2019 0930   BUN 10 08/25/2019 0930   CREATININE 0.83 08/25/2019 0930   CREATININE 0.85 12/16/2018 1609      Component Value Date/Time   CALCIUM 9.4 08/25/2019 0930   ALKPHOS 86 08/25/2019 0930   AST 20 08/25/2019 0930   ALT 26 08/25/2019 0930   BILITOT 0.4 08/25/2019 0930        Lab Results  Component Value Date   WBC 11.3 (H) 08/25/2019   HGB 13.8 08/25/2019   HCT 42.7 08/25/2019   MCV 84.4 08/25/2019   PLT 292.0 08/25/2019  Lab Results  Component Value Date   TSH 1.52 08/25/2019       Lab Results  Component Value Date   CHOL 163 08/25/2019   HDL 26.40 (L) 08/25/2019   LDLCALC 108 (H) 08/25/2019   LDLDIRECT 136.0 09/02/2016   TRIG 147.0 08/25/2019   CHOLHDL 6 08/25/2019         Lab Results  Component Value Date   HGBA1C 6.7 (H) 08/25/2019   HGBA1C 6.3 05/25/2019        Assessment & Plan:

## 2019-08-27 ENCOUNTER — Encounter: Payer: Self-pay | Admitting: Internal Medicine

## 2019-08-27 NOTE — Assessment & Plan Note (Signed)
Target wt < 208 to get BMI under 30    - Referred to nutrition 09/06/2012   Body mass index is 41 kg/m.  -  trending up Lab Results  Component Value Date   TSH 1.52 08/25/2019     Contributing to gerd risk/ doe/reviewed the need and the process to achieve and maintain neg calorie balance

## 2019-08-27 NOTE — Assessment & Plan Note (Signed)
Up to date

## 2019-08-27 NOTE — Assessment & Plan Note (Signed)
hgb a1c trending up / wt loss is critical

## 2019-08-27 NOTE — Assessment & Plan Note (Signed)
-   PFT's  05/06/2018  FEV1 1.99 (60 % ) ratio 85  p 5 % improvement from saba p nothing prior to study with DLCO  60/60c % corrects to 112 % for alv volume     Counseled re importance of smoking cessation but did not meet time criteria for separate billing     Low-dose CT lung cancer screening is recommended for patients who are 44-59 years of age with a 30+ pack-year history of smoking, and who are currently smoking or quit <=15 years ago.   Referred to Eric Form NP for shared decision making

## 2019-08-27 NOTE — Assessment & Plan Note (Addendum)
-   Stopped   cpap 2012 on his own    - Sleep titration ordered 09/06/2012 due to excess fatigue -    Sleep study 01/11/2019:  rec  Trial of CPAP therapy on 19 cm H2O with a Large size Resmed Full Face Mask AirFit F20 mask and heated humidification    Not tolerating pressure rec > f/u [name]   Etiology and pathophysiology of osa including relationship to obesity reviewed in detail

## 2019-08-30 ENCOUNTER — Institutional Professional Consult (permissible substitution): Payer: BC Managed Care – PPO | Admitting: Pulmonary Disease

## 2019-11-23 ENCOUNTER — Ambulatory Visit: Payer: BC Managed Care – PPO | Attending: Internal Medicine

## 2019-11-23 DIAGNOSIS — Z20822 Contact with and (suspected) exposure to covid-19: Secondary | ICD-10-CM | POA: Insufficient documentation

## 2019-11-25 LAB — NOVEL CORONAVIRUS, NAA: SARS-CoV-2, NAA: NOT DETECTED

## 2019-12-28 ENCOUNTER — Ambulatory Visit: Payer: Self-pay

## 2020-01-01 ENCOUNTER — Ambulatory Visit: Payer: Self-pay | Attending: Family

## 2020-01-01 DIAGNOSIS — Z23 Encounter for immunization: Secondary | ICD-10-CM | POA: Insufficient documentation

## 2020-01-01 NOTE — Progress Notes (Signed)
   Covid-19 Vaccination Clinic  Name:  [name] [name]    MRN: [id number] DOB: 23-Dec-1960  01/01/2020  [name] was observed post Covid-19 immunization for 15 minutes without incidence. He was provided with Vaccine Information Sheet and instruction to access the V-Safe system.   [name] was instructed to call 911 with any severe reactions post vaccine: [name] Difficulty breathing  . Swelling of your face and throat  . A fast heartbeat  . A bad rash all over your body  . Dizziness and weakness    Immunizations Administered    Name Date Dose VIS Date Route   Moderna COVID-19 Vaccine 01/01/2020  3:27 PM 0.5 mL 10/10/2019 Intramuscular   Manufacturer: Moderna   Lot: [id number]   [id number]

## 2020-02-13 ENCOUNTER — Ambulatory Visit: Payer: Self-pay | Attending: Family

## 2020-02-13 DIAGNOSIS — Z23 Encounter for immunization: Secondary | ICD-10-CM

## 2020-02-13 NOTE — Progress Notes (Signed)
   Covid-19 Vaccination Clinic  Name:  Garfield Pautsch    MRN: [id number] DOB: 1961-03-23  02/13/2020  Mr. Salameh was observed post Covid-19 immunization for 15 minutes without incident. He was provided with Vaccine Information Sheet and instruction to access the V-Safe system.   Mr. Suthers was instructed to call 911 with any severe reactions post vaccine: [name] Difficulty breathing  . Swelling of face and throat  . A fast heartbeat  . A bad rash all over body  . Dizziness and weakness   Immunizations Administered    Name Date Dose VIS Date Route   Moderna COVID-19 Vaccine 02/13/2020  2:51 PM 0.5 mL 10/10/2019 Intramuscular   Manufacturer: Moderna   Lot: [id number]   [id number]

## 2020-02-23 ENCOUNTER — Ambulatory Visit: Payer: BC Managed Care – PPO | Admitting: Internal Medicine

## 2020-03-07 ENCOUNTER — Encounter: Payer: Self-pay | Admitting: Pulmonary Disease

## 2020-03-07 ENCOUNTER — Other Ambulatory Visit: Payer: Self-pay

## 2020-03-07 ENCOUNTER — Ambulatory Visit: Payer: BC Managed Care – PPO | Admitting: Pulmonary Disease

## 2020-03-07 VITALS — BP 120/82 | HR 83 | Temp 97.3°F | Ht 71.0 in | Wt 291.3 lb

## 2020-03-07 DIAGNOSIS — Z720 Tobacco use: Secondary | ICD-10-CM

## 2020-03-07 DIAGNOSIS — H9203 Otalgia, bilateral: Secondary | ICD-10-CM | POA: Diagnosis not present

## 2020-03-07 DIAGNOSIS — G4733 Obstructive sleep apnea (adult) (pediatric): Secondary | ICD-10-CM

## 2020-03-07 DIAGNOSIS — H6123 Impacted cerumen, bilateral: Secondary | ICD-10-CM | POA: Diagnosis not present

## 2020-03-07 MED ORDER — AMOXICILLIN-POT CLAVULANATE 875-125 MG PO TABS: 1.0000 | ORAL_TABLET | Freq: Two times a day (BID) | ORAL | 0 refills | Status: DC

## 2020-03-07 MED ORDER — PREDNISONE 10 MG PO TABS: ORAL_TABLET | ORAL | 0 refills | Status: DC

## 2020-03-07 NOTE — Assessment & Plan Note (Signed)
Plan:  Continue CPAP  Schedule follow up with Dr. Jenetta Downer

## 2020-03-07 NOTE — Progress Notes (Signed)
@Patient  ID: [name] [name], male    DOB: [birth year], 60 y.o.   MRN: [id number]  Chief Complaint  Patient presents with  . Follow-up    Both ears have been hurting for the past 2 weeks. Decreased hearing. Has had issues with heavy wax build up. Has not noticed drainage from ears.     Referring provider: Tanda Rockers, MD  HPI:  59 year old male current smoker followed in our office for upper airway cough syndrome, reactive airways disease, obstructive sleep apnea, cough  PMH: Hypercholesterolemia, sciatica, multinodular goiter Smoker/ Smoking History: Current smoker Maintenance: None Pt of: Dr. Melvyn Novas  03/07/2020  - Visit   59 year old male current smoker last seen in our office in October/2020 followed by Dr. Jenetta Downer in our office for sleep.  Patient is followed by Dr. Melvyn Novas for primary care.  He does continue to smoke.  He is not interested in stopping smoking at this time.  He reports that he has had 2 weeks of ear pain.  This started in his right ear.  And now has progressed to both ears.  He also has noticed occasional ear drainage.  He reports that he is used over-the-counter and home remedies such as warm olive oil in his earlobe.  He is established with ear nose and throat Dr. Janace Hoard.  He has not notified them regarding his symptoms.  He last saw them in 2020 when he had his ears cleaned.  Patient denies fever.  Denies increased cough or congestion.  Denies increased nasal drainage or color change.  Patient uses his CPAP every night.  He needs to be scheduled for follow-up with Dr. Jenetta Downer regarding this.  Questionaires / Pulmonary Flowsheets:   Epworth:  Results of the Epworth flowsheet 07/15/2018  Sitting and reading 2  Watching TV 2  Sitting, inactive in a public place (e.g. a theatre or a meeting) 1  As a passenger in a car for an hour without a break 3  Lying down to rest in the afternoon when circumstances permit 3  Sitting and talking to someone 0  Sitting quietly after a  lunch without alcohol 2  In a car, while stopped for a few minutes in traffic 0  Total score 13    Tests:   12/16/2018-chest x-ray-bibasilar opacities could reflect atelectasis or infiltrates, improved since prior study, improved small left effusion, mild bronchitic changes  04/07/2018-CT maxillofacial-moderate right sphenoid sinus mucosal thickening, no fluid  12/08/2018-echocardiogram-LV ejection fraction 60 to 65%  05/06/2018-pulmonary function test-FVC 2.35 (56% predicted), postbronchodilator ratio 85, postbronchodilator FEV1 1.99 (60% predicted), no bronchodilator response, DLCO 20.53 (60% predicted)  FENO:  No results found for: NITRICOXIDE  PFT: PFT Results Latest Ref Rng & Units 05/06/2018  FVC-Pre L 2.35  FVC-Predicted Pre % 56  FVC-Post L 2.34  FVC-Predicted Post % 55  Pre FEV1/FVC % % 80  Post FEV1/FCV % % 85  FEV1-Pre L 1.89  FEV1-Predicted Pre % 57  FEV1-Post L 1.99  DLCO UNC% % 60  DLCO COR %Predicted % 112  TLC L 4.16  TLC % Predicted % 57  RV % Predicted % 81    WALK:  SIX MIN WALK 12/16/2018  Supplimental Oxygen during Test? (L/min) No  Tech Comments: Patient was able to walk at his regular walking pace. Patient did have to stop and sit down during second lap. Stated he was feeling tired, but was not short of breath.    Imaging: No results found.  Lab Results:  CBC  Component Value Date/Time   WBC 11.3 (H) 08/25/2019 0930   RBC 5.06 08/25/2019 0930   HGB 13.8 08/25/2019 0930   HCT 42.7 08/25/2019 0930   PLT 292.0 08/25/2019 0930   MCV 84.4 08/25/2019 0930   MCH 27.0 12/16/2018 1609   MCHC 32.2 08/25/2019 0930   RDW 15.0 08/25/2019 0930   LYMPHSABS 2.1 08/25/2019 0930   MONOABS 0.8 08/25/2019 0930   EOSABS 0.3 08/25/2019 0930   BASOSABS 0.1 08/25/2019 0930    BMET    Component Value Date/Time   NA 138 08/25/2019 0930   K 4.3 08/25/2019 0930   CL 103 08/25/2019 0930   CO2 29 08/25/2019 0930   GLUCOSE 99 08/25/2019 0930   BUN 10  08/25/2019 0930   CREATININE 0.83 08/25/2019 0930   CREATININE 0.85 12/16/2018 1609   CALCIUM 9.4 08/25/2019 0930   GFRNONAA >60 12/11/2018 0515   GFRAA >60 12/11/2018 0515    BNP    Component Value Date/Time   BNP 199.6 (H) 12/07/2018 1615    ProBNP    Component Value Date/Time   PROBNP 13.0 12/21/2007 1144    Specialty Problems      Pulmonary Problems   OBSTRUCTIVE SLEEP APNEA    Followed in Pulmonary clinic/ [name]    - Stopped   cpap 2012 on his own    - Sleep titration ordered 09/06/2012 due to excess fatigue -    Sleep study 01/11/2019:  rec  Trial of CPAP therapy on 19 cm H2O with a Large size Resmed Full Face Mask AirFit F20 mask and heated humidification > f/u Dr Ander Slade       Cough    Followed as Primary Care Patient/ [facility]/ [facility]       Acute bronchitis   Upper airway cough syndrome    Sinus CT 04/07/2018 Moderate right sphenoid sinus mucosal thickening. No fluid.      Acute respiratory failure with hypoxia (HCC)    12/16/2018   Walked RA  2 laps @  approx 281ft each @ avg pace  stopped due to  Fatigue, no sob or desats    As of 12/16/2018 rec 2lpm hs until re-eval by Sleep medicine but no need daytime        CAP (community acquired pneumonia)    Pos for Influenza A on admit 12/07/18 c/b hypoxemic RF,  rx tamiflu, rocephin, zmax  - cxr 12/16/2018 marked improvement / no desats walking       Influenza A   Reactive airway disease with wheezing with acute exacerbation      No Known Allergies  Immunization History  Administered Date(s) Administered  . Influenza Whole 07/11/2003  . Influenza,inj,Quad PF,6+ Mos 08/25/2019  . Moderna SARS-COVID-2 Vaccination 01/01/2020, 02/13/2020  . Pneumococcal Conjugate-13 05/25/2019  . Pneumococcal Polysaccharide-23 04/25/2009, 09/02/2010  . Td 07/10/2001  . Tdap 08/25/2011    Past Medical History:  Diagnosis Date  . Arthritis   . BPH (benign prostatic hyperplasia)   . Epididymitis    . Hyperlipidemia   . Morbid obesity ([place])   . OSA (obstructive sleep apnea)   . Upper airway cough syndrome     Tobacco History: Social History   Tobacco Use  Smoking Status Current Every Day Smoker  . Packs/day: 0.50  . Years: 25.00  . Pack years: 12.50  . Types: Cigarettes  Smokeless Tobacco Never Used   Ready to quit: No Counseling given: Yes  Smoking assessment and cessation counseling  Patient currently smoking: 0.5  ppd I have advised the patient to quit/stop smoking as soon as possible due to high risk for multiple medical problems.  It will also be very difficult for [place] to manage patient's  respiratory symptoms and status if we continue to expose her lungs to a known irritant.  We do not advise e-cigarettes as a form of stopping smoking.  Patient is not willing to quit smoking.  I have advised the patient that we can assist and have options of nicotine replacement therapy, provided smoking cessation education today, provided smoking cessation counseling, and provided cessation resources.  Follow-up next office visit office visit for assessment of smoking cessation.    Smoking cessation counseling advised for: 3 min    Outpatient Encounter Medications as of 03/07/2020  Medication Sig  . Ascorbic Acid (VITAMIN C) 100 MG tablet Take 100 mg by mouth daily.  [name] aspirin 81 MG tablet Take 81 mg by mouth daily as needed.   . cholecalciferol (VITAMIN D3) 25 MCG (1000 UT) tablet Take 1,000 Units by mouth daily.  . cyanocobalamin 1000 MCG tablet Take 1,000 mcg by mouth daily.  . meloxicam (MOBIC) 7.5 MG tablet Take 1 tablet (7.5 mg total) by mouth 2 (two) times daily as needed.  . tamsulosin (FLOMAX) 0.4 MG CAPS capsule Take 0.4 mg by mouth.  . zinc gluconate 50 MG tablet Take 50 mg by mouth daily.  [name] amoxicillin-clavulanate (AUGMENTIN) 875-125 MG tablet Take 1 tablet by mouth 2 (two) times daily.  . predniSONE (DELTASONE) 10 MG tablet Take 2 tablets (20mg  total) daily for  the next 5 days. Take in the AM with food.  . [DISCONTINUED] dextromethorphan-guaiFENesin (MUCINEX DM) 30-600 MG 12hr tablet Take 1 tablet by mouth 2 (two) times daily.   No facility-administered encounter medications on file as of 03/07/2020.     Review of Systems  Review of Systems  Constitutional: Negative for activity change, chills, fatigue, fever and unexpected weight change.  HENT: Positive for congestion, ear discharge and ear pain. Negative for postnasal drip, rhinorrhea, sinus pressure, sinus pain and sore throat.   Eyes: Negative.   Respiratory: Negative for cough, shortness of breath and wheezing.   Cardiovascular: Negative for chest pain and palpitations.  Gastrointestinal: Negative for constipation, diarrhea, nausea and vomiting.  Endocrine: Negative.   Genitourinary: Negative.   Musculoskeletal: Negative.   Skin: Negative.   Neurological: Negative for dizziness and headaches.  Psychiatric/Behavioral: Negative.  Negative for dysphoric mood. The patient is not nervous/anxious.   All other systems reviewed and are negative.    Physical Exam  BP 120/82   Pulse 83   Temp (!) 97.3 F (36.3 C) (Temporal)   Ht 5\' 11"  (1.803 m)   Wt 291 lb 4.8 oz (132.1 kg)   SpO2 100% Comment: on RA  BMI 40.63 kg/m   Wt Readings from Last 5 Encounters:  03/07/20 291 lb 4.8 oz (132.1 kg)  08/25/19 294 lb (133.4 kg)  05/25/19 289 lb 12.8 oz (131.5 kg)  01/08/19 279 lb (126.6 kg)  12/16/18 279 lb 6.4 oz (126.7 kg)    BMI Readings from Last 5 Encounters:  03/07/20 40.63 kg/m  08/25/19 41.00 kg/m  05/25/19 40.42 kg/m  01/08/19 38.91 kg/m  12/16/18 38.97 kg/m     Physical Exam Vitals and nursing note reviewed.  Constitutional:      General: He is not in acute distress.    Appearance: Normal appearance. He is obese.  HENT:     Head: Normocephalic and atraumatic.  Right Ear: Hearing and external ear normal. There is impacted cerumen.     Left Ear: Hearing and  external ear normal. There is impacted cerumen.     Nose: Nose normal. No mucosal edema or rhinorrhea.     Right Turbinates: Not enlarged.     Left Turbinates: Not enlarged.     Mouth/Throat:     Mouth: Mucous membranes are dry.     Pharynx: Oropharynx is clear. No oropharyngeal exudate.     Comments: Mallampati 4 Eyes:     Pupils: Pupils are equal, round, and reactive to light.  Cardiovascular:     Rate and Rhythm: Normal rate and regular rhythm.     Pulses: Normal pulses.     Heart sounds: Normal heart sounds. No murmur.  Pulmonary:     Effort: Pulmonary effort is normal.     Breath sounds: Normal breath sounds. No decreased breath sounds, wheezing or rales.  Musculoskeletal:     Cervical back: Normal range of motion.     Right lower leg: No edema.     Left lower leg: No edema.  Lymphadenopathy:     Cervical: No cervical adenopathy.  Skin:    General: Skin is warm and dry.     Capillary Refill: Capillary refill takes less than 2 seconds.     Findings: No erythema or rash.  Neurological:     General: No focal deficit present.     Mental Status: He is alert and oriented to person, place, and time.     Motor: No weakness.     Coordination: Coordination normal.     Gait: Gait is intact. Gait normal.  Psychiatric:        Mood and Affect: Mood normal.        Behavior: Behavior normal. Behavior is cooperative.        Thought Content: Thought content normal.        Judgment: Judgment normal.       Assessment & Plan:   Excessive ear wax, bilateral Unable to visualize either TMs bilaterally Excessive earwax buildup  Plan: Please schedule appointment with ear nose and throat for ear cleaning  OBSTRUCTIVE SLEEP APNEA Plan:  Continue CPAP  Schedule follow up with Dr. Jenetta Downer   Ear pain, bilateral Unable to visualize TMs Excessive earwax buildup Unable to rule out infection and evaluate tympanic membrane Given length of symptoms greater than 2 weeks we will empirically  treat with antibiotics and a short course of steroids Current smoker  Plan: Augmentin today Prednisone today Counseled patient on needing to stop smoking Informed patient he needs to schedule follow-up with ear nose and throat for further evaluation  Tobacco abuse Current smoker Explained to patient in office that he got be more prone to respiratory infections as well as ENT infections due to his current smoker status Patient is not interested in stopping smoking at this time Offered clinical pharmacy team appointment, patient declined  Plan: We recommend that you stop smoking If patient becomes interested in smoking cessation will be more than happy to see him as well as coordinate a smoking cessation appointment with the clinical pharmacy team    Return in about 2 months (around 05/07/2020), or if symptoms worsen or fail to improve, for Follow up with Dr. Melvyn Novas.   Lauraine Rinne, NP 03/07/2020   This appointment required 25 minutes of patient care (this includes precharting, chart review, review of results, face-to-face care, etc.).

## 2020-03-07 NOTE — Patient Instructions (Addendum)
You were seen today by Lauraine Rinne, NP  for:   1. Ear pain, bilateral  - amoxicillin-clavulanate (AUGMENTIN) 875-125 MG tablet; Take 1 tablet by mouth 2 (two) times daily.  Dispense: 14 tablet; Refill: 0 - predniSONE (DELTASONE) 10 MG tablet; Take 2 tablets (20mg  total) daily for the next 5 days. Take in the AM with food.  Dispense: 10 tablet; Refill: 0  If symptoms or not improving please schedule an appointment with ear nose and throat.  Your previous ear nose and throat provider Dr. Janace Hoard has retired.  They will get you established with a new provider at their office  2. Smoker   We recommend that you stop smoking.  >>>You need to set a quit date >>>If you have friends or family who smoke, let them know you are trying to quit and not to smoke around you or in your living environment  Smoking Cessation Resources:  1 800 QUIT NOW  >>> Patient to call this resource and utilize it to help support her quit smoking >>> Keep up your hard work with stopping smoking  You can also contact the [facility] >>>For smoking cessation classes call [phone]  We do not recommend using e-cigarettes as a form of stopping smoking  You can sign up for smoking cessation support texts and information:  >>>https://smokefree.gov/smokefreetxt    We recommend today:   Meds ordered this encounter  Medications  . amoxicillin-clavulanate (AUGMENTIN) 875-125 MG tablet    Sig: Take 1 tablet by mouth 2 (two) times daily.    Dispense:  14 tablet    Refill:  0  . predniSONE (DELTASONE) 10 MG tablet    Sig: Take 2 tablets (20mg  total) daily for the next 5 days. Take in the AM with food.    Dispense:  10 tablet    Refill:  0    Follow Up:    Return in about 4 weeks (around 04/04/2020), or if symptoms worsen or fail to improve, for Follow up with Dr. Melvyn Novas.  Patient also needs to follow-up with Dr. Ander Slade sometime over the next 2 to 8 weeks       Please do your part to reduce the  spread of COVID-19:      Reduce your risk of any infection  and COVID19 by using the similar precautions used for avoiding the common cold or flu:  [name] Wash your hands often with soap and warm water for at least 20 seconds.  If soap and water are not readily available, use an alcohol-based hand sanitizer with at least 60% alcohol.  . If coughing or sneezing, cover your mouth and nose by coughing or sneezing into the elbow areas of your shirt or coat, into a tissue or into your sleeve (not your hands). Langley Gauss A MASK when in public  . Avoid shaking hands with others and consider head nods or verbal greetings only. . Avoid touching your eyes, nose, or mouth with unwashed hands.  . Avoid close contact with people who are sick. . Avoid places or events with large numbers of people in one location, like concerts or sporting events. . If you have some symptoms but not all symptoms, continue to monitor at home and seek medical attention if your symptoms worsen. . If you are having a medical emergency, call 911.   [place] / e-Visit: [email or web address]         [facility]: [phone]  [facility]: [phone]                   [facility]: [phone]     It is flu season:   >>> Best ways to protect herself from the flu: Receive the yearly flu vaccine, practice good hand hygiene washing with soap and also using hand sanitizer when available, eat a nutritious meals, get adequate rest, hydrate appropriately   Please contact the office if your symptoms worsen or you have concerns that you are not improving.   Thank you for choosing [facility] for your healthcare, and for allowing [place] to partner with you on your healthcare journey. I am thankful to be able to provide care to you today.   Wyn Quaker FNP-C

## 2020-03-07 NOTE — Assessment & Plan Note (Signed)
Unable to visualize TMs Excessive earwax buildup Unable to rule out infection and evaluate tympanic membrane Given length of symptoms greater than 2 weeks we will empirically treat with antibiotics and a short course of steroids Current smoker  Plan: Augmentin today Prednisone today Counseled patient on needing to stop smoking Informed patient he needs to schedule follow-up with ear nose and throat for further evaluation

## 2020-03-07 NOTE — Assessment & Plan Note (Signed)
Current smoker Explained to patient in office that he got be more prone to respiratory infections as well as ENT infections due to his current smoker status Patient is not interested in stopping smoking at this time Offered clinical pharmacy team appointment, patient declined  Plan: We recommend that you stop smoking If patient becomes interested in smoking cessation will be more than happy to see him as well as coordinate a smoking cessation appointment with the clinical pharmacy team

## 2020-03-07 NOTE — Assessment & Plan Note (Signed)
Unable to visualize either TMs bilaterally Excessive earwax buildup  Plan: Please schedule appointment with ear nose and throat for ear cleaning

## 2020-03-21 ENCOUNTER — Ambulatory Visit: Payer: BC Managed Care – PPO | Admitting: Pulmonary Disease

## 2020-06-21 ENCOUNTER — Other Ambulatory Visit: Payer: Self-pay | Admitting: Internal Medicine

## 2020-08-20 ENCOUNTER — Other Ambulatory Visit: Payer: Self-pay

## 2020-08-20 ENCOUNTER — Ambulatory Visit: Payer: BC Managed Care – PPO | Admitting: Internal Medicine

## 2020-08-20 ENCOUNTER — Encounter: Payer: Self-pay | Admitting: Internal Medicine

## 2020-08-20 VITALS — BP 116/70 | HR 70 | Temp 98.5°F | Ht 71.0 in | Wt 294.8 lb

## 2020-08-20 DIAGNOSIS — F17210 Nicotine dependence, cigarettes, uncomplicated: Secondary | ICD-10-CM

## 2020-08-20 DIAGNOSIS — R739 Hyperglycemia, unspecified: Secondary | ICD-10-CM | POA: Diagnosis not present

## 2020-08-20 DIAGNOSIS — Z720 Tobacco use: Secondary | ICD-10-CM | POA: Diagnosis not present

## 2020-08-20 DIAGNOSIS — Z0000 Encounter for general adult medical examination without abnormal findings: Secondary | ICD-10-CM

## 2020-08-20 DIAGNOSIS — Z23 Encounter for immunization: Secondary | ICD-10-CM | POA: Diagnosis not present

## 2020-08-20 DIAGNOSIS — G4733 Obstructive sleep apnea (adult) (pediatric): Secondary | ICD-10-CM

## 2020-08-20 LAB — HEPATIC FUNCTION PANEL
ALT: 31 U/L (ref 0–53)
AST: 20 U/L (ref 0–37)
Albumin: 4 g/dL (ref 3.5–5.2)
Alkaline Phosphatase: 82 U/L (ref 39–117)
Bilirubin, Direct: 0.1 mg/dL (ref 0.0–0.3)
Total Bilirubin: 0.3 mg/dL (ref 0.2–1.2)
Total Protein: 7.4 g/dL (ref 6.0–8.3)

## 2020-08-20 LAB — CBC WITH DIFFERENTIAL/PLATELET
Basophils Absolute: 0.1 10*3/uL (ref 0.0–0.1)
Basophils Relative: 0.5 % (ref 0.0–3.0)
Eosinophils Absolute: 0.2 10*3/uL (ref 0.0–0.7)
Eosinophils Relative: 2.1 % (ref 0.0–5.0)
HCT: 43.3 % (ref 39.0–52.0)
Hemoglobin: 14.1 g/dL (ref 13.0–17.0)
Lymphocytes Relative: 19.4 % (ref 12.0–46.0)
Lymphs Abs: 2.1 10*3/uL (ref 0.7–4.0)
MCHC: 32.6 g/dL (ref 30.0–36.0)
MCV: 84.8 fl (ref 78.0–100.0)
Monocytes Absolute: 0.9 10*3/uL (ref 0.1–1.0)
Monocytes Relative: 8.2 % (ref 3.0–12.0)
Neutro Abs: 7.4 10*3/uL (ref 1.4–7.7)
Neutrophils Relative %: 69.8 % (ref 43.0–77.0)
Platelets: 284 10*3/uL (ref 150.0–400.0)
RBC: 5.11 Mil/uL (ref 4.22–5.81)
RDW: 15.4 % (ref 11.5–15.5)
WBC: 10.7 10*3/uL — ABNORMAL HIGH (ref 4.0–10.5)

## 2020-08-20 LAB — BASIC METABOLIC PANEL
BUN: 15 mg/dL (ref 6–23)
CO2: 28 mEq/L (ref 19–32)
Calcium: 9.3 mg/dL (ref 8.4–10.5)
Chloride: 104 mEq/L (ref 96–112)
Creatinine, Ser: 0.97 mg/dL (ref 0.40–1.50)
GFR: 84.08 mL/min (ref >60.00)
Glucose, Bld: 91 mg/dL (ref 70–99)
Potassium: 4.4 mEq/L (ref 3.5–5.1)
Sodium: 139 mEq/L (ref 135–145)

## 2020-08-20 LAB — LIPID PANEL
Cholesterol: 167 mg/dL (ref 0–200)
HDL: 31.6 mg/dL — ABNORMAL LOW (ref >39.00)
LDL Cholesterol: 104 mg/dL — ABNORMAL HIGH (ref 0–99)
NonHDL: 135.6
Total CHOL/HDL Ratio: 5
Triglycerides: 157 mg/dL — ABNORMAL HIGH (ref 0.0–149.0)
VLDL: 31.4 mg/dL (ref 0.0–40.0)

## 2020-08-20 LAB — TSH: TSH: 1.84 u[IU]/mL (ref 0.35–4.50)

## 2020-08-20 LAB — HEMOGLOBIN A1C: Hgb A1c MFr Bld: 6.6 % — ABNORMAL HIGH (ref 4.6–6.5)

## 2020-08-20 NOTE — Patient Instructions (Addendum)
For arthritis/joint pain > take mobic  7.5 up to twice daily with meals but if you start needing it a lot more than you normally do please contact [facility] asap for follow up   I am referring you to Eric Form NP low dose CT shared decision   We will make you an appt to see a nutritionist with wt loss >  You will need a food diary x 2   The key is to stop smoking completely before smoking completely stops you! - Suggested e-cigs as an optional  "one way bridge"  Off all tobacco products  If you can't stop smoking from the patch      Please remember to go to the lab department   for your tests - we will call you with the results when they are available.     Please schedule a follow up visit in 12  months but call sooner if needed

## 2020-08-20 NOTE — Assessment & Plan Note (Signed)
utd in all areas  Needs more attention to neg cal bal, regular aerobic ex / discussed

## 2020-08-20 NOTE — Assessment & Plan Note (Signed)
Active smoker - PFT's  05/06/2018  FEV1 1.99 (60 % ) ratio 85  p 5 % improvement from saba p nothing prior to study with DLCO  60/60c % corrects to 112 % for alv volume   - 08/25/2019 referred for LDSCT  Counseled re importance of smoking cessation but did not meet time criteria for separate billing   Low-dose CT lung cancer screening is recommended for patients who are 11-60 years of age with a 30+ pack-year history of smoking, and who are currently smoking or quit <=15 years ago.   >>> eligible > referred for shared decision making

## 2020-08-20 NOTE — Assessment & Plan Note (Signed)
-   Stopped   cpap 2012 on his own    - Sleep titration ordered 09/06/2012 due to excess fatigue -    Sleep study 01/11/2019:  rec  Trial of CPAP therapy on 19 cm H2O with a Large size Resmed Full Face Mask AirFit F20 mask and heated humidification > f/u Dr Ander Slade  Says sleeping well on cpap with excess sleepiness > f/u [name] as directed

## 2020-08-20 NOTE — Progress Notes (Signed)
Subjective:    Patient ID: [name] [name], male    DOB: 05/21/1961   MRN: [id number]  Brief patient profile:  59 yobm with morbid obesity active smoker with documented sleep apnea for which he has seen Dr. Halford Chessman with no improvement in his weight over the despite previous extensive counseling regarding calorie balance issues     History of Present Illness   11/16/2017  Acute ov/[name] [name] re:  DJD/ new acute cough x 10 days  Chief Complaint  Patient presents with  . Acute Visit    Appt scheduled to get refill on meloxicam for hip pain. He states he has had a cold for the past wk. He c/o runny nose, cough and HA. His cough is mainly non prod but he will occ produce some clear sputum. He has an albuterol inhaler but it's expired and he has not really felt like he needs it.   noct cough /wheeze  mucus clear since acute  onset of uri x one week with rhinorrhea, has used saba in past prn but rx ran out/ rarely needs despite ongoing smoking  rec For cough/ congestion > mucinex dm up to 1200 mg every 12 hours as needed  zpak  For wheeze/ short of breath >> albuterol inhaler up to 2 puff every 4 hours as needed  If not improving > Prednisone 10 mg take  4 each am x 2 days,   2 each am x 2 days,  1 each am x 2 days and stop    03/24/2018  f/u ov/[name] [name] re:  Cpx/ cough never > 50% better, never filled rx for  albuterol /prednisone  Chief Complaint  Patient presents with  . Annual Exam    Pt is fasting. He is c/o cough and nasal congestion for the past wk. His cough is non prod. He has had some green nasal d/c.   Dyspnea:   Not limited by breathing from desired activities   Cough: worse immediately when   w/in 5 min rhs arely takes mucinex dm  Sleep: was ok until 2 weeks prior to OV   SABA use:   Never purchased   zpak rx by [facility] 03/18/18 some better  Nasal obst / congestion/ usually clear  X sev years rec For cough > mucinex dm up to 1200 mg every 12 hours as needed  Ear wax remover is over the  counter  Add pepcid 20 mg after supper until next office visit GERD . Augmentin 875 mg take one pill twice daily  X 10 days - take at breakfast and supper with large glass of water.  It would help reduce the usual side effects (diarrhea and yeast infections) if you ate cultured yogurt at lunch.  Please see patient coordinator before you leave today  to schedule sinus CT in 2 weeks no sooner Please remember to go to the lab and x-ray department downstairs in the basement  for your tests - we will call you with the results when they are available.   Please schedule a follow up office visit in 6 weeks, call sooner if needed with all medications /inhalers/ solutions in hand so we can verify exactly what you are taking. This includes all medications from all doctors and over the counters  - PFTs on return  - Prevnar 13 on return    05/06/2018  f/u ov/[name] [name] re: obesity / smoking  Chief Complaint  Patient presents with  . Follow-up    PFT's done today. Cough has resolved.  Dyspnea:  Not limited by breathing from desired activities   Cough: resolved  Sleeping: ok flat rec Ear wax remover over the counter then see [name] [name]  if not better   Prevnar 13 today  Although I don't endorse regular use of e cigs/ many pts find them helpful; however, I emphasized they should be considered a "one-way bridge" off all tobacco products.    07/15/18 [name] [name] eval for osa rec History of obstructive sleep apnea Worsening symptoms, increasing weight gain, nonrestorative sleep Will order a split-night study Encouraged about weight loss efforts    05/25/2019  f/u ov/[name] [name] re:  MO/ borderline dm / still smoking  Chief Complaint  Patient presents with    F/u ov     Pt is fasting. Still has occ cough.   Dyspnea:  Walking stopped since got hot  Cough: none Sleeping: on cpap flat ok  rec The key is to stop smoking completely before smoking completely stops you! Weight control is simply a matter of calorie  balance    08/25/2019  f/u ov/[name] [name] re: MO/ borderline DM / still smoking Chief Complaint  Patient presents with  . Annual Exam    Pt is not fasting. He is concerned about weight gain.   Dyspnea:  Not walking since summer/L hip pain can be limiting  but usually resolves p one mobic after supper  Cough:  no Sleeping: on cpap per [name] [name]/ doing better but has not been seen him in f/u and not tolerating  cpap at levels rec SABA use: no 02: none  rec Follow up yearly with your urologist (his name will be on your flomax bottle) Weight control is simply a matter of calorie balance which needs to be tilted in your favor by eating less and exercising more.    Think of your calorie balance like you do your bank account where in this case you want the balance to go down so you must take in less calories than you burn up.  It's just that simple:  Hard to do, but easy to understand.  Good luck!  Please remember to go to the lab department   for your tests - we will call you with the results when they are available. Make appt to see Dr Ander Slade re your cpap and [name]  re:  Low dose CT chest Chest  Please schedule a follow up visit in 6  months but call sooner if needed      08/20/2020  f/u ov/[name] [name] re: MO/ borderline DM Chief Complaint  Patient presents with  . Follow-up    Denies any breathing problems  Dyspnea:  Denies even walking up to 25 min, flat Cough: none Sleeping: on cpap SABA use: none 02: none  Mobic maybe one or twice a week    No obvious day to day or daytime variability or assoc excess/ purulent sputum or mucus plugs or hemoptysis or cp or chest tightness, subjective wheeze or overt sinus or hb symptoms.   Sleeping as above  without nocturnal  or early am exacerbation  of respiratory  c/o's or need for noct saba. Also denies any obvious fluctuation of symptoms with weather or environmental changes or other aggravating or alleviating factors except as outlined above    No unusual exposure hx or h/o childhood pna/ asthma or knowledge of premature birth.  Current Allergies, Complete Past Medical History, Past Surgical History, Family History, and Social History were reviewed in [employer] record.  ROS  The following are not active complaints unless bolded Hoarseness, sore throat, dysphagia, dental problems, itching, sneezing,  nasal congestion or discharge of excess mucus or purulent secretions, ear ache,   fever, chills, sweats, unintended wt loss or wt gain, classically pleuritic or exertional cp,  orthopnea pnd or arm/hand swelling  or leg swelling, presyncope, palpitations, abdominal pain, anorexia, nausea, vomiting, diarrhea  or change in bowel habits or change in bladder habits, change in stools or change in urine, dysuria, hematuria,  rash, arthralgias, visual complaints, headache, numbness, weakness or ataxia or problems with walking or coordination,  change in mood or  memory.        Current Meds  Medication Sig  . Ascorbic Acid (VITAMIN C) 100 MG tablet Take 100 mg by mouth daily.  [name] aspirin 81 MG tablet Take 81 mg by mouth daily as needed.   . cholecalciferol (VITAMIN D3) 25 MCG (1000 UT) tablet Take 1,000 Units by mouth daily.  . meloxicam (MOBIC) 7.5 MG tablet TAKE 1 TABLET (7.5 MG TOTAL) BY MOUTH 2 (TWO) TIMES DAILY AS NEEDED.  [name] tamsulosin (FLOMAX) 0.4 MG CAPS capsule Take 0.4 mg by mouth.  . zinc gluconate 50 MG tablet Take 50 mg by mouth daily.               Past Medical History:  HYPERTENSION (ICD-V17.4)  Hx of EPIDIDYMITIS (ICD-604.90) > Left orchiectomy age 31  BPH..................................................................................................[name]  [facility] OBSTRUCTIVE SLEEP APNEA (ICD-327.23)..................................  [place]   cpap 2012 on his own    - Sleep titration ordered 09/06/2012 due to excess fatigue MORBID OBESITY (ICD-278.01)  - Target wt = 208  for BMI < 30  Hyperlipidemia male, h/o hbp and borderline dm, pos fm hx= target LDL < 130  HEALTH MAINTENANCE ....................................................................  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    NECK :  without JVD/Nodes/TM/ nl carotid upstrokes bilaterally   LUNGS: no acc muscle use,  Nl contour chest which is clear to A and P bilaterally without cough on insp or exp maneuvers   CV:  RRR  no s3 or murmur or increase in P2, and no  edema   ABD:  Obese soft and nontender with nl inspiratory excursion in the supine position. No bruits or organomegaly appreciated, bowel sounds nl  MS:  Nl gait/ ext warm without deformities, calf tenderness, cyanosis or clubbing No obvious joint restrictions   SKIN: warm and dry without lesions    NEURO:  alert, approp, nl sensorium with  no motor or cerebellar deficits apparent.         Labs ordered/ reviewed:      Chemistry      Component Value  Date/Time   NA 139 08/20/2020 0934   K 4.4 08/20/2020 0934   CL 104 08/20/2020 0934   CO2 28 08/20/2020 0934   BUN 15 08/20/2020 0934   CREATININE 0.97 08/20/2020 0934   CREATININE 0.85 12/16/2018 1609      Component Value Date/Time   CALCIUM 9.3 08/20/2020 0934   ALKPHOS 82 08/20/2020 0934   AST 20 08/20/2020 0934   ALT 31 08/20/2020 0934   BILITOT 0.3 08/20/2020 0934         Lab Results  Component Value Date   WBC 10.7 (H) 08/20/2020   HGB 14.1 08/20/2020   HCT 43.3 08/20/2020   MCV 84.8 08/20/2020   PLT 284.0 08/20/2020        Lab Results  Component Value Date   TSH 1.84 08/20/2020                 Assessment & Plan:

## 2020-08-20 NOTE — Assessment & Plan Note (Signed)
-   Referred to nutrition  08/20/2020   Body mass index is 41.12 kg/m.  -  trending no change Lab Results  Component Value Date   TSH 1.84 08/20/2020     Contributing to gerd risk/ doe/reviewed the need and the process to achieve and maintain neg calorie balance  By ex/ diet > referred to nutrition

## 2020-08-20 NOTE — Assessment & Plan Note (Signed)
Lab Results  Component Value Date   HGBA1C 6.6 (H) 08/20/2020   HGBA1C 6.7 (H) 08/25/2019   HGBA1C 6.3 05/25/2019     Still too high but at least not getting worse and FBS only 91  > rec nutrition eval next

## 2020-08-27 ENCOUNTER — Ambulatory Visit: Payer: BC Managed Care – PPO | Admitting: Internal Medicine

## 2020-10-01 ENCOUNTER — Telehealth: Payer: Self-pay | Admitting: Internal Medicine

## 2020-10-01 NOTE — Telephone Encounter (Signed)
FYI for Dr Melvyn Novas: Pt reports a 30 year smoking history at no more than 1/2 ppd/ PT aware he does not qualify for lung cancer screening. Referral cancelled.

## 2020-11-13 ENCOUNTER — Other Ambulatory Visit: Payer: BC Managed Care – PPO

## 2020-12-23 ENCOUNTER — Telehealth: Payer: Self-pay | Admitting: Internal Medicine

## 2020-12-23 NOTE — Telephone Encounter (Signed)
Looks like we already did this 08/20/20 for mobid obesity complicated by OSA  But I doubt he went but ok to try again

## 2020-12-23 NOTE — Telephone Encounter (Signed)
Spoke with pt . He would like to get a referral to a nutritionist for weight loss.  Please advise if ok to place referral.

## 2020-12-23 NOTE — Telephone Encounter (Signed)
Spoke with pt and advised that nutrition referral has been placed. Pt verbalized understanding. Nothing further needed.

## 2020-12-23 NOTE — Telephone Encounter (Signed)
LMTC x `1 for pt 

## 2020-12-24 NOTE — Telephone Encounter (Signed)
Called and spoke with patient to let him know that we were going to fix referral and place a referral to medical weight management with Laverle Patter. Patient expressed understanding. New referral has been placed and old one canceled. Nothing further needed at this time.

## 2020-12-24 NOTE — Telephone Encounter (Signed)
Called and spoke pt. Pt states he was just referred to medical nutrition therapy on 2.14.22 by Dr. Melvyn Novas. However, the pt is requesting a referral to medical weight management with Laverle Patter. Pt states this is a [employer] program.   Dr. Melvyn Novas please advise if you are ok with changing the referral. Thanks.

## 2020-12-24 NOTE — Telephone Encounter (Signed)
Fine with me to change appt

## 2021-02-26 ENCOUNTER — Other Ambulatory Visit: Payer: Self-pay | Admitting: Internal Medicine

## 2021-02-26 MED ORDER — MELOXICAM 7.5 MG PO TABS: 7.5000 mg | ORAL_TABLET | Freq: Two times a day (BID) | ORAL | 0 refills | Status: DC | PRN

## 2021-03-25 ENCOUNTER — Ambulatory Visit (INDEPENDENT_AMBULATORY_CARE_PROVIDER_SITE_OTHER): Payer: BC Managed Care – PPO | Admitting: Family Medicine

## 2021-03-27 ENCOUNTER — Encounter (INDEPENDENT_AMBULATORY_CARE_PROVIDER_SITE_OTHER): Payer: Self-pay | Admitting: Family Medicine

## 2021-03-27 ENCOUNTER — Other Ambulatory Visit: Payer: Self-pay

## 2021-03-27 ENCOUNTER — Ambulatory Visit (INDEPENDENT_AMBULATORY_CARE_PROVIDER_SITE_OTHER): Payer: BC Managed Care – PPO | Admitting: Family Medicine

## 2021-03-27 VITALS — BP 114/66 | HR 65 | Temp 97.8°F | Ht 70.0 in | Wt 293.0 lb

## 2021-03-27 DIAGNOSIS — R5383 Other fatigue: Secondary | ICD-10-CM

## 2021-03-27 DIAGNOSIS — E7800 Pure hypercholesterolemia, unspecified: Secondary | ICD-10-CM

## 2021-03-27 DIAGNOSIS — Z0289 Encounter for other administrative examinations: Secondary | ICD-10-CM

## 2021-03-27 DIAGNOSIS — R739 Hyperglycemia, unspecified: Secondary | ICD-10-CM

## 2021-03-27 DIAGNOSIS — Z9189 Other specified personal risk factors, not elsewhere classified: Secondary | ICD-10-CM | POA: Diagnosis not present

## 2021-03-27 DIAGNOSIS — R0602 Shortness of breath: Secondary | ICD-10-CM | POA: Diagnosis not present

## 2021-03-27 DIAGNOSIS — Z1331 Encounter for screening for depression: Secondary | ICD-10-CM | POA: Diagnosis not present

## 2021-03-27 DIAGNOSIS — E559 Vitamin D deficiency, unspecified: Secondary | ICD-10-CM

## 2021-03-27 DIAGNOSIS — E7849 Other hyperlipidemia: Secondary | ICD-10-CM | POA: Diagnosis not present

## 2021-03-27 DIAGNOSIS — Z6841 Body Mass Index (BMI) 40.0 and over, adult: Secondary | ICD-10-CM

## 2021-03-28 LAB — CBC WITH DIFFERENTIAL
Basophils Absolute: 0.1 10*3/uL (ref 0.0–0.2)
Basos: 1 %
EOS (ABSOLUTE): 0.3 10*3/uL (ref 0.0–0.4)
Eos: 3 %
Hematocrit: 45.7 % (ref 37.5–51.0)
Hemoglobin: 14.6 g/dL (ref 13.0–17.7)
Immature Grans (Abs): 0.1 10*3/uL (ref 0.0–0.1)
Immature Granulocytes: 1 %
Lymphocytes Absolute: 2.1 10*3/uL (ref 0.7–3.1)
Lymphs: 22 %
MCH: 27 pg (ref 26.6–33.0)
MCHC: 31.9 g/dL (ref 31.5–35.7)
MCV: 85 fL (ref 79–97)
Monocytes Absolute: 0.8 10*3/uL (ref 0.1–0.9)
Monocytes: 8 %
Neutrophils Absolute: 6.6 10*3/uL (ref 1.4–7.0)
Neutrophils: 65 %
RBC: 5.4 x10E6/uL (ref 4.14–5.80)
RDW: 14.4 % (ref 11.6–15.4)
WBC: 9.9 10*3/uL (ref 3.4–10.8)

## 2021-03-28 LAB — TSH: TSH: 1.24 u[IU]/mL (ref 0.450–4.500)

## 2021-03-28 LAB — LIPID PANEL WITH LDL/HDL RATIO
Cholesterol, Total: 183 mg/dL (ref 100–199)
HDL: 31 mg/dL — ABNORMAL LOW (ref >39)
LDL Chol Calc (NIH): 127 mg/dL — ABNORMAL HIGH (ref 0–99)
LDL/HDL Ratio: 4.1 ratio — ABNORMAL HIGH (ref 0.0–3.6)
Triglycerides: 140 mg/dL (ref 0–149)
VLDL Cholesterol Cal: 25 mg/dL (ref 5–40)

## 2021-03-28 LAB — COMPREHENSIVE METABOLIC PANEL
ALT: 33 IU/L (ref 0–44)
AST: 21 IU/L (ref 0–40)
Albumin/Globulin Ratio: 1.6 (ref 1.2–2.2)
Albumin: 4.2 g/dL (ref 3.8–4.8)
Alkaline Phosphatase: 102 IU/L (ref 44–121)
BUN/Creatinine Ratio: 13 (ref 10–24)
BUN: 11 mg/dL (ref 8–27)
Bilirubin Total: 0.3 mg/dL (ref 0.0–1.2)
CO2: 26 mmol/L (ref 20–29)
Calcium: 9.8 mg/dL (ref 8.6–10.2)
Chloride: 105 mmol/L (ref 96–106)
Creatinine, Ser: 0.84 mg/dL (ref 0.76–1.27)
Globulin, Total: 2.7 g/dL (ref 1.5–4.5)
Glucose: 89 mg/dL (ref 65–99)
Potassium: 5.1 mmol/L (ref 3.5–5.2)
Sodium: 143 mmol/L (ref 134–144)
Total Protein: 6.9 g/dL (ref 6.0–8.5)
eGFR: 99 mL/min/{1.73_m2} (ref >59)

## 2021-03-28 LAB — HEMOGLOBIN A1C
Est. average glucose Bld gHb Est-mCnc: 134 mg/dL
Hgb A1c MFr Bld: 6.3 % — ABNORMAL HIGH (ref 4.8–5.6)

## 2021-03-28 LAB — T3: T3, Total: 153 ng/dL (ref 71–180)

## 2021-03-28 LAB — VITAMIN D 25 HYDROXY (VIT D DEFICIENCY, FRACTURES): Vit D, 25-Hydroxy: 19 ng/mL — ABNORMAL LOW (ref 30.0–100.0)

## 2021-03-28 LAB — VITAMIN B12: Vitamin B-12: 1132 pg/mL (ref 232–1245)

## 2021-03-28 LAB — FOLATE: Folate: 5.6 ng/mL (ref >3.0)

## 2021-03-28 LAB — INSULIN, RANDOM: INSULIN: 22.4 u[IU]/mL (ref 2.6–24.9)

## 2021-03-28 LAB — T4: T4, Total: 6.3 ug/dL (ref 4.5–12.0)

## 2021-03-31 NOTE — Progress Notes (Signed)
Chief Complaint:   OBESITY [name] [name] (MR# [id number]) is a 60 y.o. male who presents for evaluation and treatment of obesity and related comorbidities. Current BMI is Body mass index is 42.04 kg/m. [name] [name] has been struggling with his weight for many years and has been unsuccessful in either losing weight, maintaining weight loss, or reaching his healthy weight goal.  [name] [name] is currently in the action stage of change and ready to dedicate time achieving and maintaining a healthier weight. [name] [name] is interested in becoming our patient and working on intensive lifestyle modifications including (but not limited to) diet and exercise for weight loss.  [name] [name]'s habits were reviewed today and are as follows: His family eats meals together, he thinks his family will eat healthier with him, his desired weight loss is 53 lbs, he started gaining weight after 39, his heaviest weight ever was 295 pounds, he is frequently drinking liquids with calories and he frequently eats larger portions than normal.  Depression Screen [facility]'s Food and Mood (modified PHQ-9) score was 9.  Depression screen [facility] 2/9 03/27/2021  Decreased Interest 0  Down, Depressed, Hopeless 1  PHQ - 2 Score 1  Altered sleeping 0  Tired, decreased energy 1  Change in appetite 0  Feeling bad or failure about yourself  0  Trouble concentrating 0  Moving slowly or fidgety/restless 0  Suicidal thoughts 0  PHQ-9 Score 2  Difficult doing work/chores Not difficult at all   Subjective:   1. Other fatigue [name] [name] admits to daytime somnolence and admits to waking up still tired. [name] [name] has a history of symptoms of daytime fatigue. [name] [name] generally gets 9 hours of sleep per night, and states that he has generally restful sleep. Snoring is not present. Apneic episodes are not present. Epworth Sleepiness Score is 2.  2. SOB (shortness of breath) on exertion [name] [name] notes increasing shortness of breath with  exercising and seems to be worsening over time with weight gain. He notes getting out of breath sooner with activity than he used to. This has not gotten worse recently. Dewell denies shortness of breath at rest or orthopnea.  3. Hyperglycemia [name] [name] has a history of elevated glucose and A1c readings, but no evidence of a diabetes mellitus diagnosis in his records. He has been working on diet and trying to lose weight.  4. Other hyperlipidemia [name] [name] has a history of elevated cholesterol. He is not on statin, and he is working on diet and weight loss.  5. Vitamin D deficiency [name] [name] has a history of low Vit D. He has not recent labs, and he notes fatigue.  6. At risk for heart disease [name] [name] is at a higher than average risk for cardiovascular disease due to obesity.   Assessment/Plan:   1. Other fatigue [name] [name] does feel that his weight is causing his energy to be lower than it should be. Fatigue may be related to obesity, depression or many other causes. Labs will be ordered, and in the meanwhile, Airen will focus on self care including making healthy food choices, increasing physical activity and focusing on stress reduction.  - Vitamin B12 - TSH - Folate - T3 - CBC With Differential - T4 - EKG 12-Lead  2. SOB (shortness of breath) on exertion [name] [name] does feel that he gets out of breath more easily that he used to when he exercises. [name] [name]'s shortness of breath appears to be obesity related and exercise induced. He has agreed to work on weight loss and gradually increase  exercise to treat his exercise induced shortness of breath. Will continue to monitor closely.  3. Hyperglycemia Fasting labs will be obtained today, and results with be discussed with [name] [name] in 2 weeks at his follow up visit. In the meanwhile Ramaj will start his Category 3 plan and will work on weight loss efforts.  - Insulin, random - Comprehensive metabolic panel - Hemoglobin A1c  4.  Other hyperlipidemia Cardiovascular risk and specific lipid/LDL goals reviewed. We discussed several lifestyle modifications today. We will check labs today. [name] [name] will start his Category 3 plan, and will continue to work on diet, exercise and weight loss efforts. Orders and follow up as documented in patient record.   - Lipid Panel With LDL/HDL Ratio  5. Vitamin D deficiency Low Vitamin D level contributes to fatigue and are associated with obesity, breast, and colon cancer. We will check labs today. [name] [name] will follow-up for routine testing of Vitamin D, at least 2-3 times per year to avoid over-replacement.  - VITAMIN D 25 Hydroxy (Vit-D Deficiency, Fractures)  6. Screening for depression [name] [name] had a positive depression screening. Depression is commonly associated with obesity and often results in emotional eating behaviors. We will monitor this closely and work on CBT to help improve the non-hunger eating patterns. Referral to Psychology may be required if no improvement is seen as he continues in our clinic.  7. At risk for heart disease [name] [name] was given approximately 30 minutes of coronary artery disease prevention counseling today. He is 60 y.o. male and has risk factors for heart disease including obesity. We discussed intensive lifestyle modifications today with an emphasis on specific weight loss instructions and strategies.   Repetitive spaced learning was employed today to elicit superior memory formation and behavioral change.  8. Obesity with current BMI 42.1 [name] [name] is currently in the action stage of change and his goal is to continue with weight loss efforts. I recommend [name] [name] begin the structured treatment plan as follows:  He has agreed to the Category 3 Plan.  Exercise goals: No exercise has been prescribed for now, while we concentrate on nutritional changes  Behavioral modification strategies: increasing lean protein intake and increasing vegetables.  He  was informed of the importance of frequent follow-up visits to maximize his success with intensive lifestyle modifications for his multiple health conditions. He was informed we would discuss his lab results at his next visit unless there is a critical issue that needs to be addressed sooner. [name] [name] agreed to keep his next visit at the agreed upon time to discuss these results.  Objective:   Blood pressure 114/66, pulse 65, temperature 97.8 F (36.6 C), height 5\' 10"  (1.778 m), weight 293 lb (132.9 kg), SpO2 96 %. Body mass index is 42.04 kg/m.  EKG: Normal sinus rhythm, rate 67 BPM.  Indirect Calorimeter completed today shows a VO2 of 267 and a REE of 1856.  His calculated basal metabolic rate is 7062 thus his basal metabolic rate is worse than expected.  General: Cooperative, alert, well developed, in no acute distress. HEENT: Conjunctivae and lids unremarkable. Cardiovascular: Regular rhythm.  Lungs: Normal work of breathing. Neurologic: No focal deficits.   Lab Results  Component Value Date   CREATININE 0.84 03/27/2021   BUN 11 03/27/2021   NA 143 03/27/2021   K 5.1 03/27/2021   CL 105 03/27/2021   CO2 26 03/27/2021   Lab Results  Component Value Date   ALT 33 03/27/2021   AST 21 03/27/2021   ALKPHOS 102  03/27/2021   BILITOT 0.3 03/27/2021   Lab Results  Component Value Date   HGBA1C 6.3 (H) 03/27/2021   HGBA1C 6.6 (H) 08/20/2020   HGBA1C 6.7 (H) 08/25/2019   HGBA1C 6.3 05/25/2019   Lab Results  Component Value Date   INSULIN 22.4 03/27/2021   Lab Results  Component Value Date   TSH 1.240 03/27/2021   Lab Results  Component Value Date   CHOL 183 03/27/2021   HDL 31 (L) 03/27/2021   LDLCALC 127 (H) 03/27/2021   LDLDIRECT 136.0 09/02/2016   TRIG 140 03/27/2021   CHOLHDL 5 08/20/2020   Lab Results  Component Value Date   WBC 9.9 03/27/2021   HGB 14.6 03/27/2021   HCT 45.7 03/27/2021   MCV 85 03/27/2021   PLT 284.0 08/20/2020   No results found for:  IRON, TIBC, FERRITIN  Attestation Statements:   Reviewed by clinician on day of visit: allergies, medications, problem list, medical history, surgical history, family history, social history, and previous encounter notes.   I, Trixie Dredge, am acting as transcriptionist for Dennard Nip, MD.  I have reviewed the above documentation for accuracy and completeness, and I agree with the above. - Dennard Nip, MD

## 2021-04-01 DIAGNOSIS — E7849 Other hyperlipidemia: Secondary | ICD-10-CM | POA: Insufficient documentation

## 2021-04-01 DIAGNOSIS — E559 Vitamin D deficiency, unspecified: Secondary | ICD-10-CM | POA: Insufficient documentation

## 2021-04-01 DIAGNOSIS — R5383 Other fatigue: Secondary | ICD-10-CM | POA: Insufficient documentation

## 2021-04-04 ENCOUNTER — Other Ambulatory Visit: Payer: Self-pay | Admitting: Internal Medicine

## 2021-04-04 NOTE — Telephone Encounter (Signed)
Please see the following message below:   Rx #: [id number]   Pharmacy comment: Alternative Requested:INSURANCE REQUIRES HIGHER [facility] 1 A DAY.   What would you like to do?

## 2021-04-08 ENCOUNTER — Telehealth: Payer: Self-pay | Admitting: Internal Medicine

## 2021-04-08 ENCOUNTER — Ambulatory Visit (INDEPENDENT_AMBULATORY_CARE_PROVIDER_SITE_OTHER): Payer: BC Managed Care – PPO | Admitting: Family Medicine

## 2021-04-08 MED ORDER — MELOXICAM 7.5 MG PO TABS: 7.5000 mg | ORAL_TABLET | Freq: Every day | ORAL | 0 refills | Status: DC

## 2021-04-08 NOTE — Telephone Encounter (Signed)
Ok x one Needs ov before next refill or ortho eval as did not intend this to be a daily rx indefinitely

## 2021-04-08 NOTE — Telephone Encounter (Signed)
Called and spoke with Patient. Dr. Gustavus Bryant recommendations given. Understanding stated.  Mobic 7.5mg  refill sent to requested [facility]. Nothing further at this time.

## 2021-04-08 NOTE — Telephone Encounter (Signed)
I called and spoke with patient who is requesting a refill on Mobic, 7.5mg . patient stated he has been taking one daily and ran out on Thursday and feels like he will need more. I reminded patient from last OV with Dr. Melvyn Novas that he mentioned f/u with ortho if pain becomes worse and patient verbalized understanding. Will route to Dr. Melvyn Novas.  Dr. Melvyn Novas, please advise on Mobic refill. Pharmacy is [facility] on [address]. Thanks!

## 2021-04-10 ENCOUNTER — Other Ambulatory Visit: Payer: Self-pay

## 2021-04-10 ENCOUNTER — Encounter (INDEPENDENT_AMBULATORY_CARE_PROVIDER_SITE_OTHER): Payer: Self-pay | Admitting: Family Medicine

## 2021-04-10 ENCOUNTER — Ambulatory Visit (INDEPENDENT_AMBULATORY_CARE_PROVIDER_SITE_OTHER): Payer: BC Managed Care – PPO | Admitting: Family Medicine

## 2021-04-10 VITALS — BP 110/69 | HR 67 | Temp 98.6°F | Ht 70.0 in | Wt 290.0 lb

## 2021-04-10 DIAGNOSIS — E782 Mixed hyperlipidemia: Secondary | ICD-10-CM | POA: Diagnosis not present

## 2021-04-10 DIAGNOSIS — Z9189 Other specified personal risk factors, not elsewhere classified: Secondary | ICD-10-CM

## 2021-04-10 DIAGNOSIS — E119 Type 2 diabetes mellitus without complications: Secondary | ICD-10-CM | POA: Diagnosis not present

## 2021-04-10 DIAGNOSIS — Z6841 Body Mass Index (BMI) 40.0 and over, adult: Secondary | ICD-10-CM

## 2021-04-10 DIAGNOSIS — E559 Vitamin D deficiency, unspecified: Secondary | ICD-10-CM | POA: Diagnosis not present

## 2021-04-10 MED ORDER — METFORMIN HCL 500 MG PO TABS: 500.0000 mg | ORAL_TABLET | Freq: Every day | ORAL | 0 refills | Status: DC

## 2021-04-10 MED ORDER — VITAMIN D (ERGOCALCIFEROL) 1.25 MG (50000 UNIT) PO CAPS: 50000.0000 [IU] | ORAL_CAPSULE | ORAL | 0 refills | Status: DC

## 2021-04-16 NOTE — Progress Notes (Signed)
Chief Complaint:   OBESITY [name] [name] is here to discuss his progress with his obesity treatment plan along with follow-up of his obesity related diagnoses. [name] [name] is on the Category 3 Plan and states he is following his eating plan approximately 90% of the time. Ariana states he is doing 0 minutes 0 times per week.  Today's visit was #: 2 Starting weight: 293 lbs Starting date: 03/27/2021 Today's weight: 290 lbs Today's date: 04/10/2021 Total lbs lost to date: 3 Total lbs lost since last in-office visit: 3  Interim History: [name] [name] has done well with weight loss over the last 2 weeks. He notes his hunger was mostly controlled, but he ws hungry at the appropriate times. He has tried to decrease eating out and worked on meal planning.  Subjective:   1. Mixed hyperlipidemia [name] levels are worsening. His HDL is low, and LDL is elevated. He is not on a statin. I discussed labs with the patient today.  2. Vitamin D deficiency [name] [name] is not on Vit D, and he notes fatigue. I discussed labs with the patient today.  3. Controlled type 2 diabetes mellitus without complication, without long-term current use of insulin ([name]) [name] has a new diagnosis of diabetes mellitus. His current A1c is 6.3, but he has a 6.6 in 08/2020 and a 6.7 in 08/2019. He has a strong family history of diabetes mellitus and has a lot of questions about diabetes mellitus. I discussed labs with the patient today.  4. At risk for deficient knowledge of diabetes mellitus [name] is at risk for deficient knowledge of diabetes mellitus.  Assessment/Plan:   1. Mixed hyperlipidemia Cardiovascular risk and specific lipid/LDL goals reviewed. We discussed several lifestyle modifications today. [name] will continue to work on diet, exercise and weight loss efforts. We will recheck labs in 3 months. Orders and follow up as documented in patient record.   2. Vitamin D deficiency Low Vitamin D level contributes  to fatigue and are associated with obesity, breast, and colon cancer. [name] agreed to start prescription Vitamin D 50,000 IU every week with no refills. He will follow-up for routine testing of Vitamin D, at least 2-3 times per year to avoid over-replacement.  - Vitamin D, Ergocalciferol, (DRISDOL) 1.25 MG (50000 UNIT) CAPS capsule; Take 1 capsule (50,000 Units total) by mouth every 7 (seven) days.  Dispense: 4 capsule; Refill: 0  3. Controlled type 2 diabetes mellitus without complication, without long-term current use of insulin ([place]) [name] [name] agreed to start metformin 500 mg q AM with no refills. He was educated on diabetes mellitus and risks, medications, diet, and weight loss. he is not required to test his glucose levels at this time.Good blood sugar control is important to decrease the likelihood of diabetic complications such as nephropathy, neuropathy, limb loss, blindness, coronary artery disease, and death. Intensive lifestyle modification including diet, exercise and weight loss are the first line of treatment for diabetes.   - metFORMIN (GLUCOPHAGE) 500 MG tablet; Take 1 tablet (500 mg total) by mouth daily with breakfast.  Dispense: 30 tablet; Refill: 0  4. At risk for deficient knowledge of diabetes mellitus [name] [name] was given approximately 30 minutes of diabetes education and counseling today. We discussed intensive lifestyle modifications today with an emphasis on weight loss as well as increasing exercise and decreasing simple carbohydrates in his diet. We also reviewed medication options with an emphasis on risk versus benefit of those discussed.   5. Obesity with current BMI 41.7 [name] [name] is currently in  the action stage of change. As such, his goal is to continue with weight loss efforts. He has agreed to the Category 3 Plan.   Breakfast options at [place] was given.  Behavioral modification strategies: increasing lean protein intake and no skipping meals.  [name] [name]  has agreed to follow-up with our clinic in 2 to 3 weeks. He was informed of the importance of frequent follow-up visits to maximize his success with intensive lifestyle modifications for his multiple health conditions.   Objective:   Blood pressure 110/69, pulse 67, temperature 98.6 F (37 C), height 5\' 10"  (1.778 m), weight 290 lb (131.5 kg), SpO2 98 %. Body mass index is 41.61 kg/m.  General: Cooperative, alert, well developed, in no acute distress. HEENT: Conjunctivae and lids unremarkable. Cardiovascular: Regular rhythm.  Lungs: Normal work of breathing. Neurologic: No focal deficits.   Lab Results  Component Value Date   CREATININE 0.84 03/27/2021   BUN 11 03/27/2021   NA 143 03/27/2021   K 5.1 03/27/2021   CL 105 03/27/2021   CO2 26 03/27/2021   Lab Results  Component Value Date   ALT 33 03/27/2021   AST 21 03/27/2021   ALKPHOS 102 03/27/2021   BILITOT 0.3 03/27/2021   Lab Results  Component Value Date   HGBA1C 6.3 (H) 03/27/2021   HGBA1C 6.6 (H) 08/20/2020   HGBA1C 6.7 (H) 08/25/2019   HGBA1C 6.3 05/25/2019   Lab Results  Component Value Date   INSULIN 22.4 03/27/2021   Lab Results  Component Value Date   TSH 1.240 03/27/2021   Lab Results  Component Value Date   CHOL 183 03/27/2021   HDL 31 (L) 03/27/2021   LDLCALC 127 (H) 03/27/2021   LDLDIRECT 136.0 09/02/2016   TRIG 140 03/27/2021   CHOLHDL 5 08/20/2020   Lab Results  Component Value Date   WBC 9.9 03/27/2021   HGB 14.6 03/27/2021   HCT 45.7 03/27/2021   MCV 85 03/27/2021   PLT 284.0 08/20/2020   No results found for: IRON, TIBC, FERRITIN  Attestation Statements:   Reviewed by clinician on day of visit: allergies, medications, problem list, medical history, surgical history, family history, social history, and previous encounter notes.   I, Trixie Dredge, am acting as transcriptionist for Dennard Nip, MD.  I have reviewed the above documentation for accuracy and completeness, and I  agree with the above. -  Dennard Nip, MD

## 2021-05-01 ENCOUNTER — Ambulatory Visit (INDEPENDENT_AMBULATORY_CARE_PROVIDER_SITE_OTHER): Payer: BC Managed Care – PPO | Admitting: Family Medicine

## 2021-05-01 ENCOUNTER — Encounter (INDEPENDENT_AMBULATORY_CARE_PROVIDER_SITE_OTHER): Payer: Self-pay | Admitting: Family Medicine

## 2021-05-01 ENCOUNTER — Other Ambulatory Visit: Payer: Self-pay

## 2021-05-01 VITALS — BP 128/75 | HR 72 | Temp 98.1°F | Ht 70.0 in | Wt 287.0 lb

## 2021-05-01 DIAGNOSIS — E66813 Obesity, class 3: Secondary | ICD-10-CM

## 2021-05-01 DIAGNOSIS — Z6841 Body Mass Index (BMI) 40.0 and over, adult: Secondary | ICD-10-CM

## 2021-05-01 DIAGNOSIS — E1169 Type 2 diabetes mellitus with other specified complication: Secondary | ICD-10-CM | POA: Diagnosis not present

## 2021-05-01 DIAGNOSIS — Z9189 Other specified personal risk factors, not elsewhere classified: Secondary | ICD-10-CM | POA: Diagnosis not present

## 2021-05-07 ENCOUNTER — Other Ambulatory Visit: Payer: Self-pay | Admitting: Internal Medicine

## 2021-05-07 NOTE — Progress Notes (Signed)
Chief Complaint:   OBESITY [name] [name] is here to discuss his progress with his obesity treatment plan along with follow-up of his obesity related diagnoses. [name] is on the Category 3 Plan and states he is following his eating plan approximately 70+ % of the time. [name] states he is doing 0 minutes 0 times per week.  Today's visit was #: 3 Starting weight: 293 lbs Starting date: 03/27/2021 Today's weight: 287 lbs Today's date: 05/01/2021 Total lbs lost to date: 6 Total lbs lost since last in-office visit: 3  Interim History: [name] continues to do well with weight loss. He will be going on vacation and he is open to discussing vacation eating strategies. He is sleeping well.  Subjective:   1. Type 2 diabetes mellitus with other specified complication, without long-term current use of insulin ([facility]) [name] started metformin and he was doing fine for the first 2 days, but then he had nausea, vomiting, and diarrhea. He stopped his metformin.  2. At risk for dehydration [name] is at risk for dehydration due to heat.  Assessment/Plan:   1. Type 2 diabetes mellitus with other specified complication, without long-term current use of insulin ([place]) Rivers agreed to decrease metformin to 1/2 tablet PO qhs. Good blood sugar control is important to decrease the likelihood of diabetic complications such as nephropathy, neuropathy, limb loss, blindness, coronary artery disease, and death. Intensive lifestyle modification including diet, exercise and weight loss are the first line of treatment for diabetes.   2. At risk for dehydration [name] was given approximately 15 minutes dehydration prevention counseling today. [name] is at risk for dehydration due to weight loss and current medication(s). He was encouraged to hydrate and monitor fluid status to avoid dehydration as well as weight loss plateaus.   3. Obesity with current BMI 41.2 [name] is currently in the action stage  of change. As such, his goal is to continue with weight loss efforts. He has agreed to the Category 3 Plan.   Behavioral modification strategies: travel eating strategies and celebration eating strategies.  [name] has agreed to follow-up with our clinic in 2 to 3 weeks. He was informed of the importance of frequent follow-up visits to maximize his success with intensive lifestyle modifications for his multiple health conditions.   Objective:   Blood pressure 128/75, pulse 72, temperature 98.1 F (36.7 C), height 5\' 10"  (1.778 m), weight 287 lb (130.2 kg), SpO2 96 %. Body mass index is 41.18 kg/m.  General: Cooperative, alert, well developed, in no acute distress. HEENT: Conjunctivae and lids unremarkable. Cardiovascular: Regular rhythm.  Lungs: Normal work of breathing. Neurologic: No focal deficits.   Lab Results  Component Value Date   CREATININE 0.84 03/27/2021   BUN 11 03/27/2021   NA 143 03/27/2021   K 5.1 03/27/2021   CL 105 03/27/2021   CO2 26 03/27/2021   Lab Results  Component Value Date   ALT 33 03/27/2021   AST 21 03/27/2021   ALKPHOS 102 03/27/2021   BILITOT 0.3 03/27/2021   Lab Results  Component Value Date   HGBA1C 6.3 (H) 03/27/2021   HGBA1C 6.6 (H) 08/20/2020   HGBA1C 6.7 (H) 08/25/2019   HGBA1C 6.3 05/25/2019   Lab Results  Component Value Date   INSULIN 22.4 03/27/2021   Lab Results  Component Value Date   TSH 1.240 03/27/2021   Lab Results  Component Value Date   CHOL 183 03/27/2021   HDL 31 (L) 03/27/2021   LDLCALC 127 (H) 03/27/2021  LDLDIRECT 136.0 09/02/2016   TRIG 140 03/27/2021   CHOLHDL 5 08/20/2020   Lab Results  Component Value Date   VD25OH 19.0 (L) 03/27/2021   Lab Results  Component Value Date   WBC 9.9 03/27/2021   HGB 14.6 03/27/2021   HCT 45.7 03/27/2021   MCV 85 03/27/2021   PLT 284.0 08/20/2020   No results found for: IRON, TIBC, FERRITIN  Attestation Statements:   Reviewed by clinician on day of visit:  allergies, medications, problem list, medical history, surgical history, family history, social history, and previous encounter notes.   I, Trixie Dredge, am acting as transcriptionist for Dennard Nip, MD.  I have reviewed the above documentation for accuracy and completeness, and I agree with the above. -  Dennard Nip, MD

## 2021-05-15 ENCOUNTER — Ambulatory Visit (INDEPENDENT_AMBULATORY_CARE_PROVIDER_SITE_OTHER): Payer: BC Managed Care – PPO | Admitting: Family Medicine

## 2021-05-15 ENCOUNTER — Other Ambulatory Visit: Payer: Self-pay

## 2021-05-15 ENCOUNTER — Encounter (INDEPENDENT_AMBULATORY_CARE_PROVIDER_SITE_OTHER): Payer: Self-pay | Admitting: Family Medicine

## 2021-05-15 VITALS — BP 114/68 | HR 68 | Temp 98.1°F | Ht 70.0 in | Wt 286.0 lb

## 2021-05-15 DIAGNOSIS — Z6841 Body Mass Index (BMI) 40.0 and over, adult: Secondary | ICD-10-CM

## 2021-05-15 DIAGNOSIS — E1169 Type 2 diabetes mellitus with other specified complication: Secondary | ICD-10-CM | POA: Diagnosis not present

## 2021-05-15 DIAGNOSIS — Z9189 Other specified personal risk factors, not elsewhere classified: Secondary | ICD-10-CM

## 2021-05-15 DIAGNOSIS — E559 Vitamin D deficiency, unspecified: Secondary | ICD-10-CM | POA: Diagnosis not present

## 2021-05-15 MED ORDER — VITAMIN D (ERGOCALCIFEROL) 1.25 MG (50000 UNIT) PO CAPS: 50000.0000 [IU] | ORAL_CAPSULE | ORAL | 0 refills | Status: DC

## 2021-05-16 ENCOUNTER — Telehealth: Payer: Self-pay | Admitting: Internal Medicine

## 2021-05-16 NOTE — Telephone Encounter (Signed)
Call returned to patient, confirmed DOB. Made aware per the last note MW is recommending an in office visit because he appears he did not intend the meloxicam to be a long term solution. Made aware we can make an appt or refer him to a orthpedic. Voiced understanding. Appt made with MW. Patient states he has about 2 weeks left of the meloxicam. I made him aware if once the 2 week suppy is up he cannot make it to the appt to give [place] a call. Voiced understanding.   Nothing further needed at this time.

## 2021-05-22 NOTE — Progress Notes (Signed)
Chief Complaint:   OBESITY [name] [name] is here to discuss his progress with his obesity treatment plan along with follow-up of his obesity related diagnoses. [name] [name] is on the Category 3 Plan and states he is following his eating plan approximately 75-90% of the time. [name] [name] states he is doing 0 minutes 0 times per week.  Today's visit was #: 4 Starting weight: 293 lbs Starting date: 03/27/2021 Today's weight: 286 lbs Today's date: 05/15/2021 Total lbs lost to date: 7 Total lbs lost since last in-office visit: 1  Interim History: [name] [name] continues to do well with weight loss even while on vacation. He sometimes struggles to follow the plan at lunch due to meal planning challenges.  Subjective:   1. Vitamin D deficiency [name] [name] is stable on Vit D, and he denies nausea, vomiting, or muscle weakness.  2. Type 2 diabetes mellitus with other specified complication, without long-term current use of insulin ([name] [name]) [name] [name] is doing well with weight loss, and he is trying to decrease sugary foods. He doesn't note any hypoglycemia. His last A1c was 6.3. he plans on starting metformin soon.  3. At risk for dehydration [name] [name] is at risk for dehydration due to increased heat.  Assessment/Plan:   1. Vitamin D deficiency Low Vitamin D level contributes to fatigue and are associated with obesity, breast, and colon cancer. We will refill prescription Vitamin D for 1 month. Ruston will follow-up for routine testing of Vitamin D, at least 2-3 times per year to avoid over-replacement.  - Vitamin D, Ergocalciferol, (DRISDOL) 1.25 MG (50000 UNIT) CAPS capsule; Take 1 capsule (50,000 Units total) by mouth every 7 (seven) days.  Dispense: 4 capsule; Refill: 0  2. Type 2 diabetes mellitus with other specified complication, without long-term current use of insulin ([name] [name]) [name] [name] agreed to start metformin and will continue with diet and exercise. We will continue to follow closely. Good blood sugar  control is important to decrease the likelihood of diabetic complications such as nephropathy, neuropathy, limb loss, blindness, coronary artery disease, and death. Intensive lifestyle modification including diet, exercise and weight loss are the first line of treatment for diabetes.   3. At risk for dehydration [name] [name] was given approximately 15 minutes dehydration prevention counseling today. [name] [name] is at risk for dehydration due to weight loss and current medication(s). He was encouraged to hydrate and monitor fluid status to avoid dehydration as well as weight loss plateaus.   4. Obesity with current BMI 41.1 [name] [name] is currently in the action stage of change. As such, his goal is to continue with weight loss efforts. He has agreed to the Category 3 Plan.   Behavioral modification strategies: increasing lean protein intake and no skipping meals.  [name] [name] has agreed to follow-up with our clinic in 2 to 3 weeks. He was informed of the importance of frequent follow-up visits to maximize his success with intensive lifestyle modifications for his multiple health conditions.   Objective:   Blood pressure 114/68, pulse 68, temperature 98.1 F (36.7 C), height 5\' 10"  (1.778 m), weight 286 lb (129.7 kg), SpO2 96 %. Body mass index is 41.04 kg/m.  General: Cooperative, alert, well developed, in no acute distress. HEENT: Conjunctivae and lids unremarkable. Cardiovascular: Regular rhythm.  Lungs: Normal work of breathing. Neurologic: No focal deficits.   Lab Results  Component Value Date   CREATININE 0.84 03/27/2021   BUN 11 03/27/2021   NA 143 03/27/2021   K 5.1 03/27/2021   CL 105 03/27/2021   CO2 26  03/27/2021   Lab Results  Component Value Date   ALT 33 03/27/2021   AST 21 03/27/2021   ALKPHOS 102 03/27/2021   BILITOT 0.3 03/27/2021   Lab Results  Component Value Date   HGBA1C 6.3 (H) 03/27/2021   HGBA1C 6.6 (H) 08/20/2020   HGBA1C 6.7 (H) 08/25/2019   HGBA1C 6.3  05/25/2019   Lab Results  Component Value Date   INSULIN 22.4 03/27/2021   Lab Results  Component Value Date   TSH 1.240 03/27/2021   Lab Results  Component Value Date   CHOL 183 03/27/2021   HDL 31 (L) 03/27/2021   LDLCALC 127 (H) 03/27/2021   LDLDIRECT 136.0 09/02/2016   TRIG 140 03/27/2021   CHOLHDL 5 08/20/2020   Lab Results  Component Value Date   VD25OH 19.0 (L) 03/27/2021   Lab Results  Component Value Date   WBC 9.9 03/27/2021   HGB 14.6 03/27/2021   HCT 45.7 03/27/2021   MCV 85 03/27/2021   PLT 284.0 08/20/2020   No results found for: IRON, TIBC, FERRITIN  Attestation Statements:   Reviewed by clinician on day of visit: allergies, medications, problem list, medical history, surgical history, family history, social history, and previous encounter notes.   I, Trixie Dredge, am acting as transcriptionist for Dennard Nip, MD.  I have reviewed the above documentation for accuracy and completeness, and I agree with the above. -  Dennard Nip, MD

## 2021-05-30 ENCOUNTER — Telehealth: Payer: Self-pay | Admitting: Internal Medicine

## 2021-05-30 NOTE — Telephone Encounter (Signed)
Called patient but he did not answer. Left message for patient to call back.  °

## 2021-06-02 ENCOUNTER — Other Ambulatory Visit: Payer: Self-pay

## 2021-06-02 ENCOUNTER — Encounter (INDEPENDENT_AMBULATORY_CARE_PROVIDER_SITE_OTHER): Payer: Self-pay | Admitting: Family Medicine

## 2021-06-02 ENCOUNTER — Ambulatory Visit (INDEPENDENT_AMBULATORY_CARE_PROVIDER_SITE_OTHER): Payer: BC Managed Care – PPO | Admitting: Family Medicine

## 2021-06-02 ENCOUNTER — Other Ambulatory Visit (INDEPENDENT_AMBULATORY_CARE_PROVIDER_SITE_OTHER): Payer: Self-pay | Admitting: Family Medicine

## 2021-06-02 ENCOUNTER — Other Ambulatory Visit: Payer: Self-pay | Admitting: Internal Medicine

## 2021-06-02 ENCOUNTER — Telehealth (INDEPENDENT_AMBULATORY_CARE_PROVIDER_SITE_OTHER): Payer: Self-pay

## 2021-06-02 VITALS — BP 122/80 | HR 72 | Temp 98.3°F | Ht 70.0 in | Wt 285.0 lb

## 2021-06-02 DIAGNOSIS — Z9189 Other specified personal risk factors, not elsewhere classified: Secondary | ICD-10-CM | POA: Diagnosis not present

## 2021-06-02 DIAGNOSIS — E1169 Type 2 diabetes mellitus with other specified complication: Secondary | ICD-10-CM | POA: Diagnosis not present

## 2021-06-02 DIAGNOSIS — E559 Vitamin D deficiency, unspecified: Secondary | ICD-10-CM

## 2021-06-02 DIAGNOSIS — Z6841 Body Mass Index (BMI) 40.0 and over, adult: Secondary | ICD-10-CM

## 2021-06-02 MED ORDER — METFORMIN HCL 500 MG PO TABS: 500.0000 mg | ORAL_TABLET | Freq: Every morning | ORAL | 0 refills | Status: DC

## 2021-06-02 MED ORDER — VITAMIN D (ERGOCALCIFEROL) 1.25 MG (50000 UNIT) PO CAPS: 50000.0000 [IU] | ORAL_CAPSULE | ORAL | 0 refills | Status: DC

## 2021-06-02 NOTE — Telephone Encounter (Signed)
Dr.Beasley 

## 2021-06-02 NOTE — Telephone Encounter (Signed)
Pt requesting Mobic, would you like to refill this?

## 2021-06-03 NOTE — Telephone Encounter (Signed)
Called and spoke to pt. Pt states he is needing a refill of Meloxicam and has been out recently and the tylenol isnt helping as well. Pt has a very active job. Appt rescheduled form 8/2 with MW to 7/27. Pt verbalized understanding and denied any further questions or concerns at this time.

## 2021-06-04 ENCOUNTER — Other Ambulatory Visit: Payer: Self-pay

## 2021-06-04 ENCOUNTER — Ambulatory Visit: Payer: BC Managed Care – PPO | Admitting: Internal Medicine

## 2021-06-04 ENCOUNTER — Encounter: Payer: Self-pay | Admitting: Internal Medicine

## 2021-06-04 DIAGNOSIS — R739 Hyperglycemia, unspecified: Secondary | ICD-10-CM

## 2021-06-04 DIAGNOSIS — F17210 Nicotine dependence, cigarettes, uncomplicated: Secondary | ICD-10-CM | POA: Diagnosis not present

## 2021-06-04 MED ORDER — MELOXICAM 7.5 MG PO TABS: ORAL_TABLET | ORAL | 5 refills | Status: DC

## 2021-06-04 NOTE — Assessment & Plan Note (Signed)
Active smoker - PFT's  05/06/2018  FEV1 1.99 (60 % ) ratio 85  p 5 % improvement from saba p nothing prior to study with DLCO  60/60c % corrects to 112 % for alv volume   - 08/25/2019 referred for [facility]  Counseled re importance of smoking cessation but did not meet time criteria for separate billing           Each maintenance medication was reviewed in detail including emphasizing most importantly the difference between maintenance and prns and under what circumstances the prns are to be triggered using an action plan format where appropriate.  Total time for H and P, chart review, counseling, reviewing and generating customized AVS unique to this office visit / same day charting = 28 min

## 2021-06-04 NOTE — Assessment & Plan Note (Signed)
Lab Results  Component Value Date   HGBA1C 6.3 (H) 03/27/2021   HGBA1C 6.6 (H) 08/20/2020   HGBA1C 6.7 (H) 08/25/2019     >>> referred to Int Medicine for PCP

## 2021-06-04 NOTE — Assessment & Plan Note (Signed)
Lab Results  Component Value Date   CREATININE 0.84 03/27/2021   CREATININE 0.97 08/20/2020   CREATININE 0.83 08/25/2019     Ok to refill mobic  7.5 mg one daily prn with f/u by PCP/ ortho prn  Planned.

## 2021-06-04 NOTE — Progress Notes (Signed)
Subjective:    Patient ID: [name] [name], male    DOB: 03-04-1961   MRN: [id number]  Brief patient profile:  34 yobm with morbid obesity active smoker with documented sleep apnea for which he has seen Dr. Halford Chessman with no improvement in his weight over the despite previous extensive counseling regarding calorie balance issues     History of Present Illness   11/16/2017  Acute ov/Marx Doig re:  DJD/ new acute cough x 10 days  Chief Complaint  Patient presents with   Acute Visit    Appt scheduled to get refill on meloxicam for hip pain. He states he has had a cold for the past wk. He c/o runny nose, cough and HA. His cough is mainly non prod but he will occ produce some clear sputum. He has an albuterol inhaler but it's expired and he has not really felt like he needs it.   noct cough /wheeze  mucus clear since acute  onset of uri x one week with rhinorrhea, has used saba in past prn but rx ran out/ rarely needs despite ongoing smoking  rec For cough/ congestion > mucinex dm up to 1200 mg every 12 hours as needed  zpak  For wheeze/ short of breath >> albuterol inhaler up to 2 puff every 4 hours as needed  If not improving > Prednisone 10 mg take  4 each am x 2 days,   2 each am x 2 days,  1 each am x 2 days and stop    03/24/2018  f/u ov/[name] re:  Cpx/ cough never > 50% better, never filled rx for  albuterol /prednisone  Chief Complaint  Patient presents with   Annual Exam    Pt is fasting. He is c/o cough and nasal congestion for the past wk. His cough is non prod. He has had some green nasal d/c.   Dyspnea:   Not limited by breathing from desired activities   Cough: worse immediately when   w/in 5 min rhs arely takes mucinex dm  Sleep: was ok until 2 weeks prior to OV   SABA use:   Never purchased   zpak rx by [facility] 03/18/18 some better  Nasal obst / congestion/ usually clear  X sev years rec For cough > mucinex dm up to 1200 mg every 12 hours as needed  Ear wax remover is over the  counter  Add pepcid 20 mg after supper until next office visit GERD . Augmentin 875 mg take one pill twice daily  X 10 days - take at breakfast and supper with large glass of water.  It would help reduce the usual side effects (diarrhea and yeast infections) if you ate cultured yogurt at lunch.  Please see patient coordinator before you leave today  to schedule sinus CT in 2 weeks no sooner Please remember to go to the lab and x-ray department downstairs in the basement  for your tests - we will call you with the results when they are available.   Please schedule a follow up office visit in 6 weeks, call sooner if needed with all medications /inhalers/ solutions in hand so we can verify exactly what you are taking. This includes all medications from all doctors and over the counters  - PFTs on return  - Prevnar 13 on return    05/06/2018  f/u ov/[name] re: obesity / smoking  Chief Complaint  Patient presents with   Follow-up    PFT's done today. Cough has resolved.  Dyspnea:  Not limited by breathing from desired activities   Cough: resolved  Sleeping: ok flat rec Ear wax remover over the counter then see [name]  if not better   Prevnar 13 today  Although I don't endorse regular use of e cigs/ many pts find them helpful; however, I emphasized they should be considered a "one-way bridge" off all tobacco products.    07/15/18 [name] eval for osa rec History of obstructive sleep apnea Worsening symptoms, increasing weight gain, nonrestorative sleep Will order a split-night study Encouraged about weight loss efforts    05/25/2019  f/u ov/[name] re:  MO/ borderline dm / still smoking  Chief Complaint  Patient presents with    F/u ov     Pt is fasting. Still has occ cough.   Dyspnea:  Walking stopped since got hot  Cough: none Sleeping: on cpap flat ok  rec The key is to stop smoking completely before smoking completely stops you! Weight control is simply a matter of calorie  balance    08/25/2019  f/u ov/[name] re: MO/ borderline DM / still smoking Chief Complaint  Patient presents with   Annual Exam    Pt is not fasting. He is concerned about weight gain.   Dyspnea:  Not walking since summer/L hip pain can be limiting  but usually resolves p one mobic after supper  Cough:  no Sleeping: on cpap per [name]/ doing better but has not been seen him in f/u and not tolerating  cpap at levels rec SABA use: no 02: none  rec Follow up yearly with your urologist (his name will be on your flomax bottle) Weight control is simply a matter of calorie balance which needs to be tilted in your favor by eating less and exercising more.    Think of your calorie balance like you do your bank account where in this case you want the balance to go down so you must take in less calories than you burn up.  It's just that simple:  Hard to do, but easy to understand.  Good luck!  Please remember to go to the lab department   for your tests - we will call you with the results when they are available. Make appt to see Dr Ander Slade re your cpap and [name]  re:  Low dose CT chest Chest  Please schedule a follow up visit in 6  months but call sooner if needed      08/20/2020  f/u ov/[name] re: MO/ borderline DM Chief Complaint  Patient presents with   Follow-up    Denies any breathing problems  Dyspnea:  Denies even walking up to 25 min, flat Cough: none Sleeping: on cpap SABA use: none 02: none  Mobic maybe one or twice a week   Rec For arthritis/joint pain > take mobic  7.5 up to twice daily with meals but if you start needing it a lot more than you normally do please contact Raliegh Ip asap for follow up  I am referring you to Eric Form NP low dose CT shared decision  We will make you an appt to see a nutritionist with wt loss >  You will need a food diary x 2  The key is to stop smoking completely before smoking completely stops you! - Suggested e-cigs as an optional   "one way bridge"  Off all tobacco products  If you can't stop smoking from the patch  06/04/2021  f/u ov/[name] re:  MO/ djd, borderline dm  Chief Complaint  Patient presents with   Acute Visit    Bilateral hip and knee pain- worse x 1 month since out of meloxicam.    Dyspnea:  Not limited by breathing from desired activities   Cough: none  Sleeping: cpap per [name] SABA use: none  02: none  Covid status:  x 3 vax    No obvious day to day or daytime variability or assoc excess/ purulent sputum or mucus plugs or hemoptysis or cp or chest tightness, subjective wheeze or overt sinus or hb symptoms.   Sleeping  without nocturnal  or early am exacerbation  of respiratory  c/o's or need for noct saba. Also denies any obvious fluctuation of symptoms with weather or environmental changes or other aggravating or alleviating factors except as outlined above   No unusual exposure hx or h/o childhood pna/ asthma or knowledge of premature birth.  Current Allergies, Complete Past Medical History, Past Surgical History, Family History, and Social History were reviewed in [employer] record.  ROS  The following are not active complaints unless bolded Hoarseness, sore throat, dysphagia, dental problems, itching, sneezing,  nasal congestion or discharge of excess mucus or purulent secretions, ear ache,   fever, chills, sweats, unintended wt loss or wt gain, classically pleuritic or exertional cp,  orthopnea pnd or arm/hand swelling  or leg swelling, presyncope, palpitations, abdominal pain, anorexia, nausea, vomiting, diarrhea  or change in bowel habits or change in bladder habits, change in stools or change in urine, dysuria, hematuria,  rash, arthralgias, visual complaints, headache, numbness, weakness or ataxia or problems with walking or coordination,  change in mood or  memory.        Current Meds  Medication Sig   aspirin 81 MG tablet Take 81 mg by mouth daily  as needed.    metFORMIN (GLUCOPHAGE) 500 MG tablet Take 1 tablet (500 mg total) by mouth every morning. Take 500 mg (1 tab po in am and 1/2 tablet po at bedtime)   tamsulosin (FLOMAX) 0.4 MG CAPS capsule Take 0.4 mg by mouth.   Vitamin D, Ergocalciferol, (DRISDOL) 1.25 MG (50000 UNIT) CAPS capsule Take 1 capsule (50,000 Units total) by mouth every 7 (seven) days.              Past Medical History:  HYPERTENSION (ICD-V17.4)  Hx of EPIDIDYMITIS (ICD-604.90) > Left orchiectomy age 41  BPH..................................................................................................[name]  [facility] OBSTRUCTIVE SLEEP APNEA (ICD-327.23)..................................  [place]   cpap 2012 on his own    - Sleep titration ordered 09/06/2012 due to excess fatigue MORBID OBESITY (ICD-278.01)  - Target wt = 208 for BMI < 30  Hyperlipidemia male, h/o hbp and borderline dm, pos fm hx= target LDL < 130  HEALTH MAINTENANCE ....................................................................  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    NECK :  without JVD/Nodes/TM/ nl carotid upstrokes bilaterally   LUNGS: no acc muscle use,  Nl contour chest which is clear to A and P bilaterally without cough on insp or exp maneuvers   CV:  RRR  no s3 or murmur or increase in P2, and no edema   ABD:  obese soft and nontender with nl inspiratory excursion in the supine position. No bruits or organomegaly appreciated, bowel sounds nl  MS:  Nl gait/ ext warm without deformities, calf tenderness, cyanosis or clubbing No obvious joint restrictions   SKIN: warm and dry without lesions    NEURO:  alert, approp, nl sensorium with  no motor or cerebellar deficits apparent.            Assessment & Plan:

## 2021-06-04 NOTE — Patient Instructions (Signed)
Ok to continue meloxicam 7.5 mg with meals daily as needed   I have referred you for Primary care [place]   Pulmonary follow up is as needed

## 2021-06-06 NOTE — Progress Notes (Signed)
Chief Complaint:   OBESITY [name] [name] is here to discuss his progress with his obesity treatment plan along with follow-up of his obesity related diagnoses. [name] [name] is on the Category 3 Plan and states he is following his eating plan approximately 90-95% of the time. [name] [name] states he is doing 0 minutes 0 times per week.  Today's visit was #: 5 Starting weight: 293 lbs Starting date: 03/27/2021 Today's weight: 285 lbs Today's date: 06/02/2021 Total lbs lost to date: 8 Total lbs lost since last in-office visit: 1  Interim History: [name] [name] is retaining some water weight today. He did eat more simple carbohydrates this weekend. He is following some of his plan but with small deviations.  Subjective:   1. Type 2 diabetes mellitus with other specified complication, without long-term current use of insulin ([name] [name]) [name] [name] had an elevated A1c at 6.7 in 2020. He is tolerating the metformin but he misses a dose approximately 2 times per week.  2. Vitamin D deficiency [name] [name] is stable on Vit D, but his level is not yet at goal.  3. At risk for heart disease [name] [name] is at a higher than average risk for cardiovascular disease due to obesity.   Assessment/Plan:   1. Type 2 diabetes mellitus with other specified complication, without long-term current use of insulin ([name] [name]) We will refill metformin for 1 month, and [name] [name] will continue diet and exercise. Good blood sugar control is important to decrease the likelihood of diabetic complications such as nephropathy, neuropathy, limb loss, blindness, coronary artery disease, and death. Intensive lifestyle modification including diet, exercise and weight loss are the first line of treatment for diabetes.   - metFORMIN (GLUCOPHAGE) 500 MG tablet; Take 1 tablet (500 mg total) by mouth every morning. Take 500 mg (1 tab po in am and 1/2 tablet po at bedtime)  Dispense: 60 tablet; Refill: 0  2. Vitamin D deficiency Low Vitamin D level  contributes to fatigue and are associated with obesity, breast, and colon cancer. We will refill prescription Vitamin D for 1 month. [name] [name] will follow-up for routine testing of Vitamin D, at least 2-3 times per year to avoid over-replacement.  - Vitamin D, Ergocalciferol, (DRISDOL) 1.25 MG (50000 UNIT) CAPS capsule; Take 1 capsule (50,000 Units total) by mouth every 7 (seven) days.  Dispense: 4 capsule; Refill: 0  3. At risk for heart disease [name] [name] was given approximately 15 minutes of coronary artery disease prevention counseling today. He is 60 y.o. male and has risk factors for heart disease including obesity. We discussed intensive lifestyle modifications today with an emphasis on specific weight loss instructions and strategies.   Repetitive spaced learning was employed today to elicit superior memory formation and behavioral change.  4. Obesity with current BMI 41.0 [name] [name] is currently in the action stage of change. As such, his goal is to continue with weight loss efforts. He has agreed to the Category 3 Plan.   Behavioral modification strategies: increasing lean protein intake, increasing water intake, and meal planning and cooking strategies.  [name] [name] has agreed to follow-up with our clinic in 2 to 3 weeks. He was informed of the importance of frequent follow-up visits to maximize his success with intensive lifestyle modifications for his multiple health conditions.   Objective:   Blood pressure 122/80, pulse 72, temperature 98.3 F (36.8 C), height '5\' 10"'$  (1.778 m), weight 285 lb (129.3 kg), SpO2 98 %. Body mass index is 40.89 kg/m.  General: Cooperative, alert, well developed, in no acute distress.  HEENT: Conjunctivae and lids unremarkable. Cardiovascular: Regular rhythm.  Lungs: Normal work of breathing. Neurologic: No focal deficits.   Lab Results  Component Value Date   CREATININE 0.84 03/27/2021   BUN 11 03/27/2021   NA 143 03/27/2021   K 5.1 03/27/2021    CL 105 03/27/2021   CO2 26 03/27/2021   Lab Results  Component Value Date   ALT 33 03/27/2021   AST 21 03/27/2021   ALKPHOS 102 03/27/2021   BILITOT 0.3 03/27/2021   Lab Results  Component Value Date   HGBA1C 6.3 (H) 03/27/2021   HGBA1C 6.6 (H) 08/20/2020   HGBA1C 6.7 (H) 08/25/2019   HGBA1C 6.3 05/25/2019   Lab Results  Component Value Date   INSULIN 22.4 03/27/2021   Lab Results  Component Value Date   TSH 1.240 03/27/2021   Lab Results  Component Value Date   CHOL 183 03/27/2021   HDL 31 (L) 03/27/2021   LDLCALC 127 (H) 03/27/2021   LDLDIRECT 136.0 09/02/2016   TRIG 140 03/27/2021   CHOLHDL 5 08/20/2020   Lab Results  Component Value Date   VD25OH 19.0 (L) 03/27/2021   Lab Results  Component Value Date   WBC 9.9 03/27/2021   HGB 14.6 03/27/2021   HCT 45.7 03/27/2021   MCV 85 03/27/2021   PLT 284.0 08/20/2020   No results found for: IRON, TIBC, FERRITIN  Attestation Statements:   Reviewed by clinician on day of visit: allergies, medications, problem list, medical history, surgical history, family history, social history, and previous encounter notes.   I, Trixie Dredge, am acting as transcriptionist for Dennard Nip, MD.  I have reviewed the above documentation for accuracy and completeness, and I agree with the above. -  Dennard Nip, MD

## 2021-06-10 ENCOUNTER — Ambulatory Visit: Payer: Self-pay | Admitting: Internal Medicine

## 2021-06-23 ENCOUNTER — Ambulatory Visit (INDEPENDENT_AMBULATORY_CARE_PROVIDER_SITE_OTHER): Payer: BC Managed Care – PPO | Admitting: Family Medicine

## 2021-06-23 ENCOUNTER — Encounter (INDEPENDENT_AMBULATORY_CARE_PROVIDER_SITE_OTHER): Payer: Self-pay | Admitting: Family Medicine

## 2021-06-23 ENCOUNTER — Other Ambulatory Visit: Payer: Self-pay

## 2021-06-23 VITALS — BP 121/69 | HR 79 | Temp 98.4°F | Ht 71.0 in | Wt 285.0 lb

## 2021-06-23 DIAGNOSIS — E1169 Type 2 diabetes mellitus with other specified complication: Secondary | ICD-10-CM | POA: Diagnosis not present

## 2021-06-23 DIAGNOSIS — E559 Vitamin D deficiency, unspecified: Secondary | ICD-10-CM | POA: Diagnosis not present

## 2021-06-23 DIAGNOSIS — Z6841 Body Mass Index (BMI) 40.0 and over, adult: Secondary | ICD-10-CM | POA: Diagnosis not present

## 2021-06-23 DIAGNOSIS — Z9189 Other specified personal risk factors, not elsewhere classified: Secondary | ICD-10-CM

## 2021-06-23 MED ORDER — VITAMIN D (ERGOCALCIFEROL) 1.25 MG (50000 UNIT) PO CAPS: 50000.0000 [IU] | ORAL_CAPSULE | ORAL | 0 refills | Status: DC

## 2021-06-23 MED ORDER — METFORMIN HCL 500 MG PO TABS: 500.0000 mg | ORAL_TABLET | Freq: Every morning | ORAL | 0 refills | Status: DC

## 2021-06-24 NOTE — Progress Notes (Signed)
Chief Complaint:   OBESITY [name] [name] is here to discuss his progress with his obesity treatment plan along with follow-up of his obesity related diagnoses. [name] [name] is on the Category 3 Plan and states he is following his eating plan approximately 85-90% of the time. Yahya states he is doing 0 minutes 0 times per week.  Today's visit was #: 6 Starting weight: 293 lbs Starting date: 03/27/2021 Today's weight: 285 lbs Today's date: 06/23/2021 Total lbs lost to date: 8 Total lbs lost since last in-office visit: 0  Interim History: [name] [name] has done well with maintaining his weight. He is starting to deviate from his plan a bit more and he is doing a bit more grab and go. He is doing more celebration eating but he has avoided weight gain.  Subjective:   1. Type 2 diabetes mellitus with other specified complication, without long-term current use of insulin (HCC) [name] [name] is working on diet and metformin. He has had more challenges. He denies signs of hypoglycemia.  2. Vitamin D deficiency [name] [name] is stable on Vit D, but his level is not yet at goal.  3. At risk for impaired metabolic function [name] [name] is at increased risk for impaired metabolic function due to decreased protein.  Assessment/Plan:   1. Type 2 diabetes mellitus with other specified complication, without long-term current use of insulin (HCC) [name] [name] will continue metformin and we will refill for 1 month. Good blood sugar control is important to decrease the likelihood of diabetic complications such as nephropathy, neuropathy, limb loss, blindness, coronary artery disease, and death. Intensive lifestyle modification including diet, exercise and weight loss are the first line of treatment for diabetes.   - metFORMIN (GLUCOPHAGE) 500 MG tablet; Take 1 tablet (500 mg total) by mouth every morning. Take 500 mg (1 tab po in am and 1/2 tablet po at bedtime)  Dispense: 60 tablet; Refill: 0  2. Vitamin D deficiency Low  Vitamin D level contributes to fatigue and are associated with obesity, breast, and colon cancer. We will refill prescription Vitamin D for 1 month. [name] [name] will follow-up for routine testing of Vitamin D, at least 2-3 times per year to avoid over-replacement.  - Vitamin D, Ergocalciferol, (DRISDOL) 1.25 MG (50000 UNIT) CAPS capsule; Take 1 capsule (50,000 Units total) by mouth every 7 (seven) days.  Dispense: 4 capsule; Refill: 0  3. At risk for impaired metabolic function [name] [name] was given approximately 15 minutes of impaired  metabolic function prevention counseling today. We discussed intensive lifestyle modifications today with an emphasis on specific nutrition and exercise instructions and strategies.   Repetitive spaced learning was employed today to elicit superior memory formation and behavioral change.  4. Obesity with current BMI 39.9 [name] [name] is currently in the action stage of change. As such, his goal is to continue with weight loss efforts. He has agreed to the Category 3 Plan.   Exercise goals: Walk for 20 minutes 3 times per week.  Behavioral modification strategies: meal planning and cooking strategies.  [name] [name] has agreed to follow-up with our clinic in 2 to 3 weeks. He was informed of the importance of frequent follow-up visits to maximize his success with intensive lifestyle modifications for his multiple health conditions.   Objective:   Blood pressure 121/69, pulse 79, temperature 98.4 F (36.9 C), height '5\' 11"'$  (1.803 m), weight 285 lb (129.3 kg), SpO2 96 %. Body mass index is 39.75 kg/m.  General: Cooperative, alert, well developed, in no acute distress. HEENT: Conjunctivae and lids  unremarkable. Cardiovascular: Regular rhythm.  Lungs: Normal work of breathing. Neurologic: No focal deficits.   Lab Results  Component Value Date   CREATININE 0.84 03/27/2021   BUN 11 03/27/2021   NA 143 03/27/2021   K 5.1 03/27/2021   CL 105 03/27/2021   CO2 26  03/27/2021   Lab Results  Component Value Date   ALT 33 03/27/2021   AST 21 03/27/2021   ALKPHOS 102 03/27/2021   BILITOT 0.3 03/27/2021   Lab Results  Component Value Date   HGBA1C 6.3 (H) 03/27/2021   HGBA1C 6.6 (H) 08/20/2020   HGBA1C 6.7 (H) 08/25/2019   HGBA1C 6.3 05/25/2019   Lab Results  Component Value Date   INSULIN 22.4 03/27/2021   Lab Results  Component Value Date   TSH 1.240 03/27/2021   Lab Results  Component Value Date   CHOL 183 03/27/2021   HDL 31 (L) 03/27/2021   LDLCALC 127 (H) 03/27/2021   LDLDIRECT 136.0 09/02/2016   TRIG 140 03/27/2021   CHOLHDL 5 08/20/2020   Lab Results  Component Value Date   VD25OH 19.0 (L) 03/27/2021   Lab Results  Component Value Date   WBC 9.9 03/27/2021   HGB 14.6 03/27/2021   HCT 45.7 03/27/2021   MCV 85 03/27/2021   PLT 284.0 08/20/2020   No results found for: IRON, TIBC, FERRITIN  Attestation Statements:   Reviewed by clinician on day of visit: allergies, medications, problem list, medical history, surgical history, family history, social history, and previous encounter notes.   I, Trixie Dredge, am acting as transcriptionist for Dennard Nip, MD.  I have reviewed the above documentation for accuracy and completeness, and I agree with the above. -  Dennard Nip, MD

## 2021-06-29 ENCOUNTER — Other Ambulatory Visit: Payer: Self-pay

## 2021-06-29 ENCOUNTER — Ambulatory Visit
Admission: EM | Admit: 2021-06-29 | Discharge: 2021-06-29 | Disposition: A | Discharge status: Home / Self Care | Payer: BC Managed Care – PPO | Attending: Urgent Care | Admitting: Urgent Care

## 2021-06-29 ENCOUNTER — Encounter: Payer: Self-pay | Admitting: Emergency Medicine

## 2021-06-29 DIAGNOSIS — R0981 Nasal congestion: Secondary | ICD-10-CM

## 2021-06-29 DIAGNOSIS — J069 Acute upper respiratory infection, unspecified: Secondary | ICD-10-CM | POA: Diagnosis not present

## 2021-06-29 DIAGNOSIS — F17200 Nicotine dependence, unspecified, uncomplicated: Secondary | ICD-10-CM

## 2021-06-29 MED ORDER — CETIRIZINE HCL 10 MG PO TABS: 10.0000 mg | ORAL_TABLET | Freq: Every day | ORAL | 0 refills | Status: DC

## 2021-06-29 MED ORDER — PROMETHAZINE-DM 6.25-15 MG/5ML PO SYRP: 5.0000 mL | ORAL_SOLUTION | Freq: Every evening | ORAL | 0 refills | Status: DC | PRN

## 2021-06-29 MED ORDER — IPRATROPIUM BROMIDE 0.03 % NA SOLN: 2.0000 | Freq: Two times a day (BID) | NASAL | 0 refills | Status: DC

## 2021-06-29 MED ORDER — PSEUDOEPHEDRINE HCL 60 MG PO TABS: 60.0000 mg | ORAL_TABLET | Freq: Three times a day (TID) | ORAL | 0 refills | Status: DC | PRN

## 2021-06-29 NOTE — ED Triage Notes (Signed)
Pt sts URI sx with congestion x 3 days with neg covid testing; pt sts wife with similar sx

## 2021-06-29 NOTE — ED Provider Notes (Signed)
[name]   MRN: [id number] DOB: [birth year]  Subjective:   [name] [name] is a 60 y.o. male presenting for 3-day history of acute onset sinus congestion, postnasal drainage, mild cough.  Has 1 sick contact with his wife.  She tested negative for COVID-19.  Patient also tested himself and was negative at home.  His wife actually went to a [facility] though.  He denies fever, body aches, ear pain, chest pain, shortness of breath, wheezing.  Patient is a smoker, does 1/2ppd.  Had a headache that has a history of reactive airway disease.  No current facility-administered medications for this encounter.  Current Outpatient Medications:    aspirin 81 MG tablet, Take 81 mg by mouth daily as needed. , Disp: , Rfl:    meloxicam (MOBIC) 7.5 MG tablet, One daily with meals as needed for joint pain, Disp: 30 tablet, Rfl: 5   metFORMIN (GLUCOPHAGE) 500 MG tablet, Take 1 tablet (500 mg total) by mouth every morning. Take 500 mg (1 tab po in am and 1/2 tablet po at bedtime), Disp: 60 tablet, Rfl: 0   tamsulosin (FLOMAX) 0.4 MG CAPS capsule, Take 0.4 mg by mouth., Disp: , Rfl:    Vitamin D, Ergocalciferol, (DRISDOL) 1.25 MG (50000 UNIT) CAPS capsule, Take 1 capsule (50,000 Units total) by mouth every 7 (seven) days., Disp: 4 capsule, Rfl: 0   No Known Allergies  Past Medical History:  Diagnosis Date   Arthritis    Back pain    BPH (benign prostatic hyperplasia)    Epididymitis    Hyperlipidemia    Joint pain    Morbid obesity (HCC)    OSA (obstructive sleep apnea)    Sleep apnea    Upper airway cough syndrome      Past Surgical History:  Procedure Laterality Date   TESTICLE REMOVAL  age 1    Family History  Problem Relation Age of Onset   Coronary artery disease Mother 44   Diabetes Mother    Heart disease Mother    Hyperlipidemia Mother    Heart failure Mother    Diabetes Father    Heart disease Father    Diabetes Brother        older brother   Cancer Neg Hx      Social History   Tobacco Use   Smoking status: Every Day    Packs/day: 0.50    Years: 25.00    Pack years: 12.50    Types: Cigarettes   Smokeless tobacco: Never  Vaping Use   Vaping Use: Never used  Substance Use Topics   Alcohol use: Yes    Alcohol/week: 3.0 - 4.0 standard drinks    Types: 3 - 4 Standard drinks or equivalent per week   Drug use: No    ROS   Objective:   Vitals: BP 120/80 (BP Location: Left Arm)   Pulse 69   Temp 98.4 F (36.9 C) (Oral)   Resp 18   SpO2 95%   Physical Exam Constitutional:      General: He is not in acute distress.    Appearance: Normal appearance. He is well-developed and normal weight. He is not ill-appearing, toxic-appearing or diaphoretic.  HENT:     Head: Normocephalic and atraumatic.     Right Ear: Tympanic membrane, ear canal and external ear normal. There is no impacted cerumen.     Left Ear: Tympanic membrane, ear canal and external ear normal. There is no impacted cerumen.  Nose: Congestion present. No rhinorrhea.     Mouth/Throat:     Mouth: Mucous membranes are moist.     Pharynx: No oropharyngeal exudate or posterior oropharyngeal erythema.  Eyes:     General: No scleral icterus.       Right eye: No discharge.        Left eye: No discharge.     Extraocular Movements: Extraocular movements intact.     Conjunctiva/sclera: Conjunctivae normal.     Pupils: Pupils are equal, round, and reactive to light.  Cardiovascular:     Rate and Rhythm: Normal rate and regular rhythm.     Heart sounds: Normal heart sounds. No murmur heard.   No friction rub. No gallop.  Pulmonary:     Effort: Pulmonary effort is normal. No respiratory distress.     Breath sounds: Normal breath sounds. No stridor. No wheezing, rhonchi or rales.  Musculoskeletal:     Cervical back: Normal range of motion and neck supple. No rigidity. No muscular tenderness.  Neurological:     General: No focal deficit present.     Mental Status: He is  alert and oriented to person, place, and time.  Psychiatric:        Mood and Affect: Mood normal.        Behavior: Behavior normal.        Thought Content: Thought content normal.        Judgment: Judgment normal.    Assessment and Plan :   PDMP not reviewed this encounter.  1. Viral upper respiratory infection   2. Sinus congestion   3. Smoker     Suspect viral URI, viral syndrome; physical exam findings reassuring and vital signs stable for discharge. Advised supportive care, offered symptomatic relief.  Offered patient a chest x-ray given his history but he refused as he does not want to go to a different facility to get this and we will have a rad tech in our clinic today.  Ultimately, he does have a clear cardiopulmonary exam, reassuring hemodynamically stable vital signs.  Deferred COVID-19 testing given multiple negative test between him and his wife.  Counseled patient on potential for adverse effects with medications prescribed/recommended today, ER and return-to-clinic precautions discussed, patient verbalized understanding.     [name] [name], [place] 06/29/21 1251

## 2021-06-29 NOTE — Discharge Instructions (Addendum)
We will manage this as a viral illness. For sore throat or cough try using a honey-based tea. Use 3 teaspoons of honey with juice squeezed from half lemon. Place shaved pieces of ginger into 1/2-1 cup of water and warm over stove top. Then mix the ingredients and repeat every 4 hours as needed. Please take Tylenol '500mg'$ -'650mg'$  every 6 hours for throat pain, fevers, aches and pains. Hydrate very well with at least 2 liters of water. Eat light meals such as soups (chicken and noodles, vegetable, chicken and wild rice).  Do not eat foods that you are allergic to.  Taking an antihistamine like Zyrtec, Allegra or Claritin can help against postnasal drainage, sinus congestion.  You can take this together with pseudoephedrine (Sudafed) at a dose of 60 mg 3 times a day or twice daily as needed for the same kind of nasal drip, congestion.  However, limit your use of pseudoephedrine if you have high blood pressure or avoid altogether if you have abnormal heart rhythms, heart condition.

## 2021-07-06 ENCOUNTER — Ambulatory Visit: Payer: Self-pay

## 2021-07-06 ENCOUNTER — Ambulatory Visit
Admission: EM | Admit: 2021-07-06 | Discharge: 2021-07-06 | Disposition: A | Discharge status: Home / Self Care | Payer: BC Managed Care – PPO | Attending: Internal Medicine | Admitting: Internal Medicine

## 2021-07-06 ENCOUNTER — Encounter: Payer: Self-pay | Admitting: Emergency Medicine

## 2021-07-06 DIAGNOSIS — J069 Acute upper respiratory infection, unspecified: Secondary | ICD-10-CM | POA: Diagnosis not present

## 2021-07-06 DIAGNOSIS — R053 Chronic cough: Secondary | ICD-10-CM | POA: Diagnosis not present

## 2021-07-06 MED ORDER — PREDNISONE 10 MG (21) PO TBPK: ORAL_TABLET | Freq: Every day | ORAL | 0 refills | Status: DC

## 2021-07-06 MED ORDER — AMOXICILLIN-POT CLAVULANATE 875-125 MG PO TABS: 1.0000 | ORAL_TABLET | Freq: Two times a day (BID) | ORAL | 0 refills | Status: DC

## 2021-07-06 MED ORDER — AZITHROMYCIN 500 MG PO TABS: 500.0000 mg | ORAL_TABLET | Freq: Every day | ORAL | 0 refills | Status: AC

## 2021-07-06 NOTE — ED Provider Notes (Signed)
[facility]    CSN: [id number] Arrival date & time: 07/06/21  [id number]      History   Chief Complaint Chief Complaint  Patient presents with   Cough    HPI [name] [name] is a 60 y.o. male.   Patient presents with nasal congestion and persistent cough that has been present for approximately 10 days.  Patient was seen on 06/29/2021 when symptoms first started and was prescribed Sudafed and cough medication with minimal improvement in symptoms.  Patient states that he does "feel better" but the cough is persistent and he is worried about pneumonia.  Cough is productive with yellow mucus.  Denies any chest pain or shortness of breath.  Declined COVID testing at previous visit but is now requesting COVID test.  Wife has similar symptoms but has had negative COVID-19 test.  Patient denies any chronic lung conditions but does have documented reactive airway disease.   Cough  Past Medical History:  Diagnosis Date   Arthritis    Back pain    BPH (benign prostatic hyperplasia)    Epididymitis    Hyperlipidemia    Joint pain    Morbid obesity ([place])    OSA (obstructive sleep apnea)    Sleep apnea    Upper airway cough syndrome     Patient Active Problem List   Diagnosis Date Noted   Other fatigue 04/01/2021   Other hyperlipidemia 04/01/2021   Vitamin D deficiency 04/01/2021   Ear pain, bilateral 03/07/2020   Hyperglycemia 05/25/2019   CAP (community acquired pneumonia) 12/07/2018   Elevated troponin 12/07/2018   Acute respiratory failure with hypoxia ([place]) 12/07/2018   Tobacco abuse 12/07/2018   Influenza A 12/07/2018   Reactive airway disease with wheezing with acute exacerbation 12/07/2018   Pyuria 12/07/2018   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   Upper airway cough syndrome 03/24/2018   Healthcare maintenance 09/02/2016   Skin tag 09/02/2016   Hordeolum 08/15/2015   Olecranon bursitis of right elbow 05/18/2014   Other malaise and fatigue  02/22/2014   Acute bronchitis 02/28/2013   Multinodular goiter 09/21/2012   Sciatica 09/06/2012   Cough 06/06/2012   Cigarette smoker 08/19/2010   BENIGN PROSTATIC HYPERTROPHY, HX OF 08/19/2010   HYPERCHOLESTEROLEMIA 04/25/2009   Excessive ear wax, bilateral 12/22/2007   Class 3 severe obesity with serious comorbidity and body mass index (BMI) of 40.0 to 44.9 in adult ([place]) 09/06/2007   OBSTRUCTIVE SLEEP APNEA 09/06/2007    Past Surgical History:  Procedure Laterality Date   TESTICLE REMOVAL  age 44       Home Medications    Prior to Admission medications   Medication Sig Start Date End Date Taking? Authorizing Provider  amoxicillin-clavulanate (AUGMENTIN) 875-125 MG tablet Take 1 tablet by mouth every 12 (twelve) hours. 07/06/21  Yes Odis Luster, FNP  azithromycin (ZITHROMAX) 500 MG tablet Take 1 tablet (500 mg total) by mouth daily for 5 days. 07/06/21 07/11/21 Yes Odis Luster, FNP  predniSONE (STERAPRED UNI-PAK 21 TAB) 10 MG (21) TBPK tablet Take by mouth daily. Take 6 tabs by mouth daily  for 2 days, then 5 tabs for 2 days, then 4 tabs for 2 days, then 3 tabs for 2 days, 2 tabs for 2 days, then 1 tab by mouth daily for 2 days 07/06/21  Yes Odis Luster, FNP  aspirin 81 MG tablet Take 81 mg by mouth daily as needed.     [provider]  cetirizine (ZYRTEC ALLERGY) 10  MG tablet Take 1 tablet (10 mg total) by mouth daily. 06/29/21   Jaynee Eagles, PA-C  ipratropium (ATROVENT) 0.03 % nasal spray Place 2 sprays into both nostrils 2 (two) times daily. 06/29/21   Jaynee Eagles, PA-C  meloxicam (MOBIC) 7.5 MG tablet One daily with meals as needed for joint pain 06/04/21   Tanda Rockers, MD  metFORMIN (GLUCOPHAGE) 500 MG tablet Take 1 tablet (500 mg total) by mouth every morning. Take 500 mg (1 tab po in am and 1/2 tablet po at bedtime) 06/23/21   Dennard Nip D, MD  promethazine-dextromethorphan (PROMETHAZINE-DM) 6.25-15 MG/5ML syrup Take 5 mLs by mouth at bedtime as needed  for cough. 06/29/21   Jaynee Eagles, PA-C  pseudoephedrine (SUDAFED) 60 MG tablet Take 1 tablet (60 mg total) by mouth every 8 (eight) hours as needed for congestion. 06/29/21   Jaynee Eagles, PA-C  tamsulosin (FLOMAX) 0.4 MG CAPS capsule Take 0.4 mg by mouth.    [provider]  Vitamin D, Ergocalciferol, (DRISDOL) 1.25 MG (50000 UNIT) CAPS capsule Take 1 capsule (50,000 Units total) by mouth every 7 (seven) days. 06/23/21   Starlyn Skeans, MD    Family History Family History  Problem Relation Age of Onset   Coronary artery disease Mother 75   Diabetes Mother    Heart disease Mother    Hyperlipidemia Mother    Heart failure Mother    Diabetes Father    Heart disease Father    Diabetes Brother        older brother   Cancer Neg Hx     Social History Social History   Tobacco Use   Smoking status: Every Day    Packs/day: 0.50    Years: 25.00    Pack years: 12.50    Types: Cigarettes   Smokeless tobacco: Never  Vaping Use   Vaping Use: Never used  Substance Use Topics   Alcohol use: Yes    Alcohol/week: 3.0 - 4.0 standard drinks    Types: 3 - 4 Standard drinks or equivalent per week   Drug use: No     Allergies   Patient has no known allergies.   Review of Systems Review of Systems Per HPI  Physical Exam Triage Vital Signs ED Triage Vitals  Enc Vitals Group     BP 07/06/21 1030 115/77     Pulse Rate 07/06/21 1030 71     Resp 07/06/21 1030 16     Temp 07/06/21 1030 97.9 F (36.6 C)     Temp Source 07/06/21 1030 Oral     SpO2 07/06/21 1030 92 %     Weight --      Height --      Head Circumference --      Peak Flow --      Pain Score 07/06/21 1031 0     Pain Loc --      Pain Edu? --      Excl. in [place]? --    No data found.  Updated Vital Signs BP 115/77   Pulse 71   Temp 97.9 F (36.6 C) (Oral)   Resp 16   SpO2 92%   Visual Acuity Right Eye Distance:   Left Eye Distance:   Bilateral Distance:    Right Eye Near:   Left Eye Near:     Bilateral Near:     Physical Exam Constitutional:      General: He is not in acute distress.    Appearance: Normal  appearance. He is not toxic-appearing or diaphoretic.  HENT:     Head: Normocephalic and atraumatic.     Right Ear: Tympanic membrane and ear canal normal.     Left Ear: Tympanic membrane and ear canal normal.     Nose: Congestion present.     Mouth/Throat:     Mouth: Mucous membranes are moist.     Pharynx: No posterior oropharyngeal erythema.  Eyes:     Extraocular Movements: Extraocular movements intact.     Conjunctiva/sclera: Conjunctivae normal.     Pupils: Pupils are equal, round, and reactive to light.  Cardiovascular:     Rate and Rhythm: Normal rate and regular rhythm.     Pulses: Normal pulses.     Heart sounds: Normal heart sounds.  Pulmonary:     Effort: Pulmonary effort is normal. No respiratory distress.     Breath sounds: Normal breath sounds. No wheezing, rhonchi or rales.  Abdominal:     General: Abdomen is flat. Bowel sounds are normal.     Palpations: Abdomen is soft.  Musculoskeletal:        General: Normal range of motion.     Cervical back: Normal range of motion.  Skin:    General: Skin is warm and dry.  Neurological:     General: No focal deficit present.     Mental Status: He is alert and oriented to person, place, and time. Mental status is at baseline.  Psychiatric:        Mood and Affect: Mood normal.        Behavior: Behavior normal.     UC Treatments / Results  Labs (all labs ordered are listed, but only abnormal results are displayed) Labs Reviewed  NOVEL CORONAVIRUS, NAA    EKG   Radiology No results found.  Procedures Procedures (including critical care time)  Medications Ordered in UC Medications - No data to display  Initial Impression / Assessment and Plan / UC Course  I have reviewed the triage vital signs and the nursing notes.  Pertinent labs & imaging results that were available during my care of  the patient were reviewed by me and considered in my medical decision making (see chart for details).     Patient has oxygen saturation ranging from 91 to 92% on room air.  Advised patient that he needs to go to the hospital for further evaluation and management, but patient refused.  Risks associated with not going to the hospital were discussed with patient.  Imaging of chest is necessary, but there is no x-ray tech or radiology available today.  Suggested outpatient imaging to patient but patient declined this.  Due to patient refusing to go to the hospital, no imaging available, and worrisome oxygen saturation, will cover for pneumonia and atypical organisms with Augmentin and azithromycin.  Also prescribed prednisone steroid taper to decrease inflammation in chest.  Patient is not in any respiratory distress and does not have shortness of breath at this time.  Advised patient to monitor pulse oximetry at home at least twice daily.  Patient states that he does have a pulse oximeter at home.  Advised patient to go to the hospital if oximetry is consistently below 93% or shortness of breath develops.Discussed strict return precautions. Patient verbalized understanding and is agreeable with plan.  Final Clinical Impressions(s) / UC Diagnoses   Final diagnoses:  Persistent cough  Acute upper respiratory infection     Discharge Instructions      You have been  prescribed 2 antibiotics and a steroid to help with symptoms.  Please monitor your oxygen daily and go to the hospital if oxygen is consistently below 93% or if you develop shortness of breath.     ED Prescriptions     Medication Sig Dispense Auth. Provider   amoxicillin-clavulanate (AUGMENTIN) 875-125 MG tablet Take 1 tablet by mouth every 12 (twelve) hours. 14 tablet Odis Luster, FNP   azithromycin (ZITHROMAX) 500 MG tablet Take 1 tablet (500 mg total) by mouth daily for 5 days. 5 tablet Odis Luster, FNP   predniSONE  (STERAPRED UNI-PAK 21 TAB) 10 MG (21) TBPK tablet Take by mouth daily. Take 6 tabs by mouth daily  for 2 days, then 5 tabs for 2 days, then 4 tabs for 2 days, then 3 tabs for 2 days, 2 tabs for 2 days, then 1 tab by mouth daily for 2 days 42 tablet Odis Luster, FNP      PDMP not reviewed this encounter.   Odis Luster, [place] 07/06/21 [phone]

## 2021-07-06 NOTE — Discharge Instructions (Addendum)
You have been prescribed 2 antibiotics and a steroid to help with symptoms.  Please monitor your oxygen daily and go to the hospital if oxygen is consistently below 93% or if you develop shortness of breath.

## 2021-07-06 NOTE — ED Triage Notes (Signed)
Reports cold symptoms x 1 week. Has taken prescribed medications over the last week. Now coughing with yellow mucus. Unsure of fever. O2 sat 93% on room air. Requesting covid test and asking about pneumonia.

## 2021-07-07 ENCOUNTER — Ambulatory Visit (INDEPENDENT_AMBULATORY_CARE_PROVIDER_SITE_OTHER): Payer: BC Managed Care – PPO | Admitting: Family Medicine

## 2021-07-07 LAB — SARS-COV-2, NAA 2 DAY TAT

## 2021-07-07 LAB — NOVEL CORONAVIRUS, NAA: SARS-CoV-2, NAA: NOT DETECTED

## 2021-07-21 ENCOUNTER — Ambulatory Visit (INDEPENDENT_AMBULATORY_CARE_PROVIDER_SITE_OTHER): Payer: BC Managed Care – PPO | Admitting: Family Medicine

## 2021-07-21 ENCOUNTER — Other Ambulatory Visit: Payer: Self-pay

## 2021-07-21 ENCOUNTER — Encounter (INDEPENDENT_AMBULATORY_CARE_PROVIDER_SITE_OTHER): Payer: Self-pay | Admitting: Family Medicine

## 2021-07-21 VITALS — BP 124/74 | HR 75 | Temp 98.5°F | Ht 71.0 in | Wt 289.0 lb

## 2021-07-21 DIAGNOSIS — E559 Vitamin D deficiency, unspecified: Secondary | ICD-10-CM | POA: Diagnosis not present

## 2021-07-21 DIAGNOSIS — R7303 Prediabetes: Secondary | ICD-10-CM | POA: Diagnosis not present

## 2021-07-21 DIAGNOSIS — Z6841 Body Mass Index (BMI) 40.0 and over, adult: Secondary | ICD-10-CM | POA: Diagnosis not present

## 2021-07-21 DIAGNOSIS — Z9189 Other specified personal risk factors, not elsewhere classified: Secondary | ICD-10-CM

## 2021-07-21 MED ORDER — VITAMIN D (ERGOCALCIFEROL) 1.25 MG (50000 UNIT) PO CAPS: 50000.0000 [IU] | ORAL_CAPSULE | ORAL | 0 refills | Status: DC

## 2021-07-22 NOTE — Progress Notes (Signed)
Chief Complaint:   OBESITY [name] [name] is here to discuss his progress with his obesity treatment plan along with follow-up of his obesity related diagnoses. [name] [name] is on the Category 3 Plan and states he is following his eating plan approximately 0% of the time. [name] [name] states he is doing 0 minutes 0 times per week.  Today's visit was #: 7 Starting weight: 293 lbs Starting date: 03/27/2021 Today's weight: 289 lbs Today's date: 07/21/2021 Total lbs lost to date: 4 Total lbs lost since last in-office visit: 0  Interim History: [name] [name] has had an upper respiratory infection and was on an antibiotic and prednisone, and struggled to stick with his eating plan. He has gotten back on track now.  Subjective:   1. Vitamin D deficiency [name] [name] is stable on Vit D, and he requests a refill. Last level was not yet at goal.   2. Pre-diabetes [name] [name] continues to work on his diet. He stopped his metformin during this time while he was sick, and he just started back to it.  3. At risk for impaired metabolic function [name] [name] is at increased risk for impaired metabolic function if protein decreases.  Assessment/Plan:   1. Vitamin D deficiency Low Vitamin D level contributes to fatigue and are associated with obesity, breast, and colon cancer. We will refill prescription Vitamin D for 1 month. [name] [name] will follow-up for routine testing of Vitamin D, at least 2-3 times per year to avoid over-replacement.  - Vitamin D, Ergocalciferol, (DRISDOL) 1.25 MG (50000 UNIT) CAPS capsule; Take 1 capsule (50,000 Units total) by mouth every 7 (seven) days.  Dispense: 4 capsule; Refill: 0  2. Pre-diabetes [name] [name] will continue metformin, and will continue to work on weight loss, exercise, and decreasing simple carbohydrates to help decrease the risk of diabetes.   3. At risk for impaired metabolic function [name] [name] was given approximately 15 minutes of impaired  metabolic function prevention  counseling today. We discussed intensive lifestyle modifications today with an emphasis on specific nutrition and exercise instructions and strategies.   Repetitive spaced learning was employed today to elicit superior memory formation and behavioral change.  4. Obesity with current BMI 40.3 [name] [name] is currently in the action stage of change. As such, his goal is to continue with weight loss efforts. He has agreed to the Category 3 Plan.   Behavioral modification strategies: increasing lean protein intake.  [name] [name] has agreed to follow-up with our clinic in 2 to 3 weeks. He was informed of the importance of frequent follow-up visits to maximize his success with intensive lifestyle modifications for his multiple health conditions.   Objective:   Blood pressure 124/74, pulse 75, temperature 98.5 F (36.9 C), height '5\' 11"'$  (1.803 m), weight 289 lb (131.1 kg), SpO2 98 %. Body mass index is 40.31 kg/m.  General: Cooperative, alert, well developed, in no acute distress. HEENT: Conjunctivae and lids unremarkable. Cardiovascular: Regular rhythm.  Lungs: Normal work of breathing. Neurologic: No focal deficits.   Lab Results  Component Value Date   CREATININE 0.84 03/27/2021   BUN 11 03/27/2021   NA 143 03/27/2021   K 5.1 03/27/2021   CL 105 03/27/2021   CO2 26 03/27/2021   Lab Results  Component Value Date   ALT 33 03/27/2021   AST 21 03/27/2021   ALKPHOS 102 03/27/2021   BILITOT 0.3 03/27/2021   Lab Results  Component Value Date   HGBA1C 6.3 (H) 03/27/2021   HGBA1C 6.6 (H) 08/20/2020   HGBA1C 6.7 (H) 08/25/2019  HGBA1C 6.3 05/25/2019   Lab Results  Component Value Date   INSULIN 22.4 03/27/2021   Lab Results  Component Value Date   TSH 1.240 03/27/2021   Lab Results  Component Value Date   CHOL 183 03/27/2021   HDL 31 (L) 03/27/2021   LDLCALC 127 (H) 03/27/2021   LDLDIRECT 136.0 09/02/2016   TRIG 140 03/27/2021   CHOLHDL 5 08/20/2020   Lab Results   Component Value Date   VD25OH 19.0 (L) 03/27/2021   Lab Results  Component Value Date   WBC 9.9 03/27/2021   HGB 14.6 03/27/2021   HCT 45.7 03/27/2021   MCV 85 03/27/2021   PLT 284.0 08/20/2020   No results found for: IRON, TIBC, FERRITIN  Attestation Statements:   Reviewed by clinician on day of visit: allergies, medications, problem list, medical history, surgical history, family history, social history, and previous encounter notes.   I, Trixie Dredge, am acting as transcriptionist for Dennard Nip, MD.  I have reviewed the above documentation for accuracy and completeness, and I agree with the above. -  Dennard Nip, MD

## 2021-07-30 NOTE — Telephone Encounter (Signed)
error 

## 2021-08-08 ENCOUNTER — Ambulatory Visit (INDEPENDENT_AMBULATORY_CARE_PROVIDER_SITE_OTHER): Payer: BC Managed Care – PPO | Admitting: Family Medicine

## 2021-08-20 ENCOUNTER — Ambulatory Visit: Payer: BC Managed Care – PPO | Admitting: Internal Medicine

## 2021-08-26 ENCOUNTER — Ambulatory Visit (INDEPENDENT_AMBULATORY_CARE_PROVIDER_SITE_OTHER): Payer: BC Managed Care – PPO | Admitting: Family Medicine

## 2021-08-26 ENCOUNTER — Ambulatory Visit (INDEPENDENT_AMBULATORY_CARE_PROVIDER_SITE_OTHER): Payer: BC Managed Care – PPO | Admitting: Internal Medicine

## 2021-08-26 ENCOUNTER — Encounter (INDEPENDENT_AMBULATORY_CARE_PROVIDER_SITE_OTHER): Payer: Self-pay | Admitting: Family Medicine

## 2021-08-26 ENCOUNTER — Encounter: Payer: Self-pay | Admitting: Internal Medicine

## 2021-08-26 ENCOUNTER — Other Ambulatory Visit: Payer: Self-pay

## 2021-08-26 VITALS — BP 125/77 | HR 85 | Temp 98.0°F | Ht 71.0 in | Wt 286.0 lb

## 2021-08-26 VITALS — BP 126/82 | HR 72 | Temp 98.0°F | Ht 71.0 in | Wt 290.8 lb

## 2021-08-26 DIAGNOSIS — F17210 Nicotine dependence, cigarettes, uncomplicated: Secondary | ICD-10-CM | POA: Diagnosis not present

## 2021-08-26 DIAGNOSIS — Z6841 Body Mass Index (BMI) 40.0 and over, adult: Secondary | ICD-10-CM

## 2021-08-26 DIAGNOSIS — E1169 Type 2 diabetes mellitus with other specified complication: Secondary | ICD-10-CM | POA: Diagnosis not present

## 2021-08-26 DIAGNOSIS — Z23 Encounter for immunization: Secondary | ICD-10-CM | POA: Diagnosis not present

## 2021-08-26 DIAGNOSIS — G4733 Obstructive sleep apnea (adult) (pediatric): Secondary | ICD-10-CM | POA: Diagnosis not present

## 2021-08-26 DIAGNOSIS — E559 Vitamin D deficiency, unspecified: Secondary | ICD-10-CM

## 2021-08-26 DIAGNOSIS — Z9189 Other specified personal risk factors, not elsewhere classified: Secondary | ICD-10-CM

## 2021-08-26 DIAGNOSIS — R739 Hyperglycemia, unspecified: Secondary | ICD-10-CM | POA: Diagnosis not present

## 2021-08-26 DIAGNOSIS — R058 Other specified cough: Secondary | ICD-10-CM | POA: Diagnosis not present

## 2021-08-26 DIAGNOSIS — E66813 Obesity, class 3: Secondary | ICD-10-CM

## 2021-08-26 MED ORDER — VITAMIN D (ERGOCALCIFEROL) 1.25 MG (50000 UNIT) PO CAPS: 50000.0000 [IU] | ORAL_CAPSULE | ORAL | 0 refills | Status: DC

## 2021-08-26 NOTE — Patient Instructions (Addendum)
Please discuss exercise with Dr Leafy Ro   GERD (REFLUX)  is an extremely common cause of respiratory symptoms just like yours , many times with no obvious heartburn at all.    It can be treated with medication, but also with lifestyle changes including elevation of the head of your bed (ideally with 6 -8inch blocks under the headboard of your bed),  Smoking cessation, avoidance of late meals, excessive alcohol, and avoid fatty foods, chocolate, peppermint, colas, red wine, and acidic juices such as orange juice.  NO MINT OR MENTHOL PRODUCTS SO NO COUGH DROPS  USE SUGARLESS CANDY INSTEAD (Jolley ranchers or Stover's or Life Savers) or even ice chips will also do - the key is to swallow to prevent all throat clearing. NO OIL BASED VITAMINS - use powdered substitutes.  Avoid fish oil when coughing.   If cough persists please call for pulmonary follow up as needed   I will be referring you to our lung scanning program which is run by our Nurse Practitioner  in [place] by phone - can be done wherever it is most convenient for you    Pulmonary follow up is as needed

## 2021-08-26 NOTE — Assessment & Plan Note (Signed)
Active smoker - PFT's  05/06/2018  FEV1 1.99 (60 % ) ratio 85  p 5 % improvement from saba p nothing prior to study with DLCO  60/60c % corrects to 112 % for alv volume   - 08/25/2019 referred for LDSCT > did not do > rec again 08/26/2021

## 2021-08-26 NOTE — Addendum Note (Signed)
Addended by: Konrad Felix L on: 08/26/2021 09:32 AM   Modules accepted: Orders

## 2021-08-26 NOTE — Assessment & Plan Note (Signed)
Sinus CT 04/07/2018 Moderate right sphenoid sinus mucosal thickening. No fluid.  May now have component of gerd   Diet reviewe/ bed blocks/ f/u prn if not improving         Each maintenance medication was reviewed in detail including emphasizing most importantly the difference between maintenance and prns and under what circumstances the prns are to be triggered using an action plan format where appropriate.  Total time for H and P, chart review, counseling, reviewing  cpapdevice(s) and generating customized AVS unique to this office visit / same day charting => 30 min

## 2021-08-26 NOTE — Assessment & Plan Note (Signed)
Still gaining wt on glucophage 500 mg daily, holding off exercise until talks to Dr Leafy Ro today

## 2021-08-26 NOTE — Assessment & Plan Note (Addendum)
[name] [name] f/u   - Stopped   cpap 2012 on his own    - Sleep titration ordered 09/06/2012 due to excess fatigue -    Sleep study 01/11/2019:  rec  Trial of CPAP therapy on 19 cm H2O with a Large size Resmed Full Face Mask AirFit F20 mask and heated humidification > f/u Dr Ander Slade - download 08/26/2021 AHI 1.7 and compliance 100% on autoset   Referred back to Dr Ander Slade for regular f/u   Etiology and pathophysiology of osa including relationship to obesity reviewed in detail

## 2021-08-26 NOTE — Progress Notes (Signed)
Chief Complaint:   OBESITY [name] [name] is here to discuss his progress with his obesity treatment plan along with follow-up of his obesity related diagnoses. [name] [name] is on the Category 3 Plan and states he is following his eating plan approximately 85-90% of the time. [name] [name] states he is doing 0 minutes 0 times per week.  Today's visit was #: 8 Starting weight: 293 lbs Starting date: 03/27/2021 Today's weight: 286 lbs Today's date: 08/26/2021 Total lbs lost to date: 7 Total lbs lost since last in-office visit: 3  Interim History: [name] [name] continues to do well with weight loss even with traveling and some celebration eating. He does well with his breakfast protein. He tried to portion control and decrease sugar. He hasn't done formal exercise, but he did a lot of walking.  Subjective:   1. Vitamin D deficiency [name] [name] is stable on Vit D, but his level is not yet at goal. His last level was very low.  2. Type 2 diabetes mellitus with other specified complication, without long-term current use of insulin (HCC) [name] [name] is doing better with tolerating his metformin when he takes it with food. He denies GI upset.  3. At risk for diabetes mellitus [name] [name] is at a higher than average risk for cardiovascular disease due to obesity.   Assessment/Plan:   1. Vitamin D deficiency Low Vitamin D level contributes to fatigue and are associated with obesity, breast, and colon cancer. We will refill prescription Vitamin D for 1 month, and we will recheck labs in 1 month. [name] [name] will follow-up for routine testing of Vitamin D, at least 2-3 times per year to avoid over-replacement.  2. Type 2 diabetes mellitus with other specified complication, without long-term current use of insulin ([name] [name]) [name] [name] will continue metformin and his eating plan, and we will recheck labs in 1 month. Good blood sugar control is important to decrease the likelihood of diabetic complications such as nephropathy,  neuropathy, limb loss, blindness, coronary artery disease, and death. Intensive lifestyle modification including diet, exercise and weight loss are the first line of treatment for diabetes.   3. At risk for diabetes mellitus [name] [name] was given approximately 15 minutes of diabetes education and counseling today. We discussed intensive lifestyle modifications today with an emphasis on weight loss as well as increasing exercise and decreasing simple carbohydrates in his diet. We also reviewed medication options with an emphasis on risk versus benefit of those discussed.   Repetitive spaced learning was employed today to elicit superior memory formation and behavioral change.  4. Obesity with current BMI 39.9 [name] [name] is currently in the action stage of change. As such, his goal is to continue with weight loss efforts. He has agreed to the Category 3 Plan.   Behavioral modification strategies: increasing lean protein intake and meal planning and cooking strategies.  [name] [name] has agreed to follow-up with our clinic in 4 weeks. He was informed of the importance of frequent follow-up visits to maximize his success with intensive lifestyle modifications for his multiple health conditions.   Objective:   Blood pressure 125/77, pulse 85, temperature 98 F (36.7 C), height 5\' 11"  (1.803 m), weight 286 lb (129.7 kg), SpO2 96 %. Body mass index is 39.89 kg/m.  General: Cooperative, alert, well developed, in no acute distress. HEENT: Conjunctivae and lids unremarkable. Cardiovascular: Regular rhythm.  Lungs: Normal work of breathing. Neurologic: No focal deficits.   Lab Results  Component Value Date   CREATININE 0.84 03/27/2021   BUN 11 03/27/2021  NA 143 03/27/2021   K 5.1 03/27/2021   CL 105 03/27/2021   CO2 26 03/27/2021   Lab Results  Component Value Date   ALT 33 03/27/2021   AST 21 03/27/2021   ALKPHOS 102 03/27/2021   BILITOT 0.3 03/27/2021   Lab Results  Component Value Date    HGBA1C 6.3 (H) 03/27/2021   HGBA1C 6.6 (H) 08/20/2020   HGBA1C 6.7 (H) 08/25/2019   HGBA1C 6.3 05/25/2019   Lab Results  Component Value Date   INSULIN 22.4 03/27/2021   Lab Results  Component Value Date   TSH 1.240 03/27/2021   Lab Results  Component Value Date   CHOL 183 03/27/2021   HDL 31 (L) 03/27/2021   LDLCALC 127 (H) 03/27/2021   LDLDIRECT 136.0 09/02/2016   TRIG 140 03/27/2021   CHOLHDL 5 08/20/2020   Lab Results  Component Value Date   VD25OH 19.0 (L) 03/27/2021   Lab Results  Component Value Date   WBC 9.9 03/27/2021   HGB 14.6 03/27/2021   HCT 45.7 03/27/2021   MCV 85 03/27/2021   PLT 284.0 08/20/2020   No results found for: IRON, TIBC, FERRITIN  Attestation Statements:   Reviewed by clinician on day of visit: allergies, medications, problem list, medical history, surgical history, family history, social history, and previous encounter notes.   I, Trixie Dredge, am acting as transcriptionist for Dennard Nip, MD.  I have reviewed the above documentation for accuracy and completeness, and I agree with the above. -  Dennard Nip, MD

## 2021-08-26 NOTE — Progress Notes (Signed)
Subjective:    Patient ID: [name] [name], male    DOB: 1960-11-20   MRN: [id number]  Brief patient profile:  60  yobm with morbid obesity active smoker with documented sleep apnea for which he has seen [name]. Halford [name] with no improvement in his weight over the despite previous extensive counseling regarding calorie balance issues     History of Present Illness   11/16/2017  Acute ov/[name] [name] re:  DJD/ new acute cough x 10 days  Chief Complaint  Patient presents with   Acute Visit    Appt scheduled to get refill on meloxicam for hip pain. He states he has had a cold for the past wk. He c/o runny nose, cough and HA. His cough is mainly non prod but he will occ produce some clear sputum. He has an albuterol inhaler but it's expired and he has not really felt like he needs it.   noct cough /wheeze  mucus clear since acute  onset of uri x one week with rhinorrhea, has used saba in past prn but rx ran out/ rarely needs despite ongoing smoking  rec For cough/ congestion > mucinex dm up to 1200 mg every 12 hours as needed  zpak  For wheeze/ short of breath >> albuterol inhaler up to 2 puff every 4 hours as needed  If not improving > Prednisone 10 mg take  4 each am x 2 days,   2 each am x 2 days,  1 each am x 2 days and stop    03/24/2018  f/u ov/[name] [name] re:  Cpx/ cough never > 50% better, never filled rx for  albuterol /prednisone  Chief Complaint  Patient presents with   Annual Exam    Pt is fasting. He is c/o cough and nasal congestion for the past wk. His cough is non prod. He has had some green nasal d/c.   Dyspnea:   Not limited by breathing from desired activities   Cough: worse immediately when   w/in 5 min rhs arely takes mucinex dm  Sleep: was ok until 2 weeks prior to OV   SABA use:   Never purchased   zpak rx by [facility] 03/18/18 some better  Nasal obst / congestion/ usually clear  X sev years rec For cough > mucinex dm up to 1200 mg every 12 hours as needed  Ear wax remover is over the  counter  Add pepcid 20 mg after supper until next office visit GERD . Augmentin 875 mg take one pill twice daily  X 10 days - take at breakfast and supper with large glass of water.  It would help reduce the usual side effects (diarrhea and yeast infections) if you ate cultured yogurt at lunch.  Please see patient coordinator before you leave today  to schedule sinus CT in 2 weeks no sooner Please remember to go to the lab and x-ray department downstairs in the basement  for your tests - we will call you with the results when they are available.   Please schedule a follow up office visit in 6 weeks, call sooner if needed with all medications /inhalers/ solutions in hand so we can verify exactly what you are taking. This includes all medications from all doctors and over the counters  - PFTs on return  - Prevnar 13 on return    05/06/2018  f/u ov/[name] [name] re: obesity / smoking  Chief Complaint  Patient presents with   Follow-up    PFT's done today. Cough has  resolved.  Dyspnea:  Not limited by breathing from desired activities   Cough: resolved  Sleeping: ok flat rec Ear wax remover over the counter then see [name] [name]  if not better   Prevnar 13 today  Although I don't endorse regular use of e cigs/ many pts find them helpful; however, I emphasized they should be considered a "one-way bridge" off all tobacco products.    07/15/18 [name] [name] eval for osa rec History of obstructive sleep apnea Worsening symptoms, increasing weight gain, nonrestorative sleep Will order a split-night study Encouraged about weight loss efforts    05/25/2019  f/u ov/[name] [name] re:  MO/ borderline dm / still smoking  Chief Complaint  Patient presents with    F/u ov     Pt is fasting. Still has occ cough.   Dyspnea:  Walking stopped since got hot  Cough: none Sleeping: on cpap flat ok  rec The key is to stop smoking completely before smoking completely stops you! Weight control is simply a matter of calorie  balance    08/25/2019  f/u ov/[name] [name] re: MO/ borderline DM / still smoking Chief Complaint  Patient presents with   Annual Exam    Pt is not fasting. He is concerned about weight gain.   Dyspnea:  Not walking since summer/L hip pain can be limiting  but usually resolves p one mobic after supper  Cough:  no Sleeping: on cpap per [name] [name]/ doing better but has not been seen him in f/u and not tolerating  cpap at levels rec SABA use: no 02: none  rec Follow up yearly with your urologist (his name will be on your flomax bottle) Weight control is simply a matter of calorie balance which needs to be tilted in your favor by eating less and exercising more.    Think of your calorie balance like you do your bank account where in this case you want the balance to go down so you must take in less calories than you burn up.  It's just that simple:  Hard to do, but easy to understand.  Good luck!  Please remember to go to the lab department   for your tests - we will call you with the results when they are available. Make appt to see [name] [name] re your cpap and [name] [name]  re:  Low dose CT chest Chest  Please schedule a follow up visit in 6  months but call sooner if needed      08/20/2020  f/u ov/[name] [name] re: MO/ borderline DM Chief Complaint  Patient presents with   Follow-up    Denies any breathing problems  Dyspnea:  Denies even walking up to 25 min, flat Cough: none Sleeping: on cpap SABA use: none 02: none  Mobic maybe one or twice a week   Rec For arthritis/joint pain > take mobic  7.5 up to twice daily with meals but if you start needing it a lot more than you normally do please contact [name] [name] asap for follow up  I am referring you to [name] Form [name] low dose CT shared decision  We will make you an appt to see a nutritionist with wt loss >  You will need a food diary x 2  The key is to stop smoking completely before smoking completely stops you! - Suggested e-cigs as an optional   "one way bridge"  Off all tobacco products  If you can't stop smoking from the patch  06/04/2021  f/u ov/[name] [name] re:  MO/ djd, borderline dm  Chief Complaint  Patient presents with   Acute Visit    Bilateral hip and knee pain- worse x 1 month since out of meloxicam.    Dyspnea:  Not limited by breathing from desired activities   Cough: none  Sleeping: cpap per [name] [name] SABA use: none  02: none  Covid status:  x 3 vax  Rec Ok to continue meloxicam 7.5 mg with meals daily as needed  I have referred you for Primary care [place]  Pulmonary follow up is as needed     08/26/2021  f/u ov/Alvis Edgell re: DJD/ DM/ smoker/   maint on glucophage /  3-4 x times per week take mobic 7.5 mg with bfast   Chief Complaint  Patient presents with   Follow-up    1 year follow up: occasional cough, uses cpap everyday    Dyspnea:  no reg exercise says [name] Leafy Ro told him not to start yet and it's been too hot anyway Cough: rarely / occ late night if drinks water then coughs  Sleeping: on cpap/ [name] [name]  SABA use: none  02: none  Covid status:   vax x 3    No obvious day to day or daytime variability or assoc excess/ purulent sputum or mucus plugs or hemoptysis or cp or chest tightness, subjective wheeze or overt sinus or hb symptoms.    Also denies any obvious fluctuation of symptoms with weather or environmental changes or other aggravating or alleviating factors except as outlined above   No unusual exposure hx or h/o childhood pna/ asthma or knowledge of premature birth.  Current Allergies, Complete Past Medical History, Past Surgical History, Family History, and Social History were reviewed in [employer] record.  ROS  The following are not active complaints unless bolded Hoarseness, sore throat, dysphagia, dental problems, itching, sneezing,  nasal congestion or discharge of excess mucus or purulent secretions, ear ache,   fever, chills, sweats, unintended wt loss  or wt gain, classically pleuritic or exertional cp,  orthopnea pnd or arm/hand swelling  or leg swelling, presyncope, palpitations, abdominal pain, anorexia, nausea, vomiting, diarrhea  or change in bowel habits or change in bladder habits, change in stools or change in urine, dysuria, hematuria,  rash, arthralgias, visual complaints, headache, numbness, weakness or ataxia or problems with walking or coordination,  change in mood or  memory.        Current Meds  Medication Sig   aspirin 81 MG tablet Take 81 mg by mouth daily as needed.    meloxicam (MOBIC) 7.5 MG tablet One daily with meals as needed for joint pain   metFORMIN (GLUCOPHAGE) 500 MG tablet Take 1 tablet (500 mg total) by mouth every morning. Take 500 mg  1 dao;u    tamsulosin (FLOMAX) 0.4 MG CAPS capsule Take 0.4 mg by mouth.                  Past Medical History:  HYPERTENSION (ICD-V17.4)  Hx of EPIDIDYMITIS (ICD-604.90) > Left orchiectomy age 71  BPH..................................................................................................[name]  [facility] OBSTRUCTIVE SLEEP APNEA (ICD-327.23)..................................  [place]   cpap 2012 on his own    - Sleep titration ordered 09/06/2012 due to excess fatigue MORBID OBESITY (ICD-278.01)  - Target wt = 208 for BMI < 30  Hyperlipidemia male, h/o hbp and borderline dm, pos fm hx= target LDL < 130  HEALTH MAINTENANCE ...................................................................[name]  [facility]  -  Tdap 08/25/2011  - Pneumovax April 25, 2009 and Prevnar 13  05/25/2019  - Colonsocopy 12/2010 [place] - CPX  08/20/2020     Family History:  Coronary Heart Disease mother onset at 62  Diabetes mother, father, older brother  No ca Father diet at 48 suddenly with neg autopsy   Social History:  Patient is a current smoker, considering to quit but not ready-1/2 PPD  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    NECK :  without JVD/Nodes/TM/ nl carotid upstrokes bilaterally   LUNGS: no acc muscle use,  Nl contour chest which is clear to A and P bilaterally without cough on insp or exp maneuvers   CV:  RRR  no s3 or murmur or increase in P2, and no edema   ABD: obese  soft and nontender with nl inspiratory excursion in the supine position. No bruits or organomegaly appreciated, bowel sounds nl  MS:  Nl gait/ ext warm without deformities, calf tenderness, cyanosis or clubbing No obvious joint restrictions   SKIN: warm and dry without lesions    NEURO:  alert, approp, nl sensorium with  no motor or cerebellar deficits apparent.            Assessment & Plan:

## 2021-09-23 ENCOUNTER — Ambulatory Visit: Payer: BC Managed Care – PPO | Admitting: Emergency Medicine

## 2021-09-23 ENCOUNTER — Encounter: Payer: Self-pay | Admitting: Emergency Medicine

## 2021-09-23 ENCOUNTER — Other Ambulatory Visit: Payer: Self-pay

## 2021-09-23 VITALS — BP 122/72 | HR 70 | Temp 98.4°F | Ht 71.0 in | Wt 290.0 lb

## 2021-09-23 DIAGNOSIS — F17210 Nicotine dependence, cigarettes, uncomplicated: Secondary | ICD-10-CM | POA: Diagnosis not present

## 2021-09-23 DIAGNOSIS — Z23 Encounter for immunization: Secondary | ICD-10-CM

## 2021-09-23 DIAGNOSIS — E7849 Other hyperlipidemia: Secondary | ICD-10-CM

## 2021-09-23 DIAGNOSIS — E559 Vitamin D deficiency, unspecified: Secondary | ICD-10-CM

## 2021-09-23 DIAGNOSIS — Z1211 Encounter for screening for malignant neoplasm of colon: Secondary | ICD-10-CM

## 2021-09-23 DIAGNOSIS — Z8639 Personal history of other endocrine, nutritional and metabolic disease: Secondary | ICD-10-CM

## 2021-09-23 DIAGNOSIS — G4733 Obstructive sleep apnea (adult) (pediatric): Secondary | ICD-10-CM | POA: Diagnosis not present

## 2021-09-23 DIAGNOSIS — E042 Nontoxic multinodular goiter: Secondary | ICD-10-CM | POA: Diagnosis not present

## 2021-09-23 DIAGNOSIS — Z0001 Encounter for general adult medical examination with abnormal findings: Secondary | ICD-10-CM | POA: Diagnosis not present

## 2021-09-23 DIAGNOSIS — Z7689 Persons encountering health services in other specified circumstances: Secondary | ICD-10-CM

## 2021-09-23 LAB — CBC WITH DIFFERENTIAL/PLATELET
Basophils Absolute: 0 10*3/uL (ref 0.0–0.1)
Basophils Relative: 0.5 % (ref 0.0–3.0)
Eosinophils Absolute: 0.2 10*3/uL (ref 0.0–0.7)
Eosinophils Relative: 1.8 % (ref 0.0–5.0)
HCT: 43.7 % (ref 39.0–52.0)
Hemoglobin: 14.3 g/dL (ref 13.0–17.0)
Lymphocytes Relative: 20.8 % (ref 12.0–46.0)
Lymphs Abs: 2.1 10*3/uL (ref 0.7–4.0)
MCHC: 32.6 g/dL (ref 30.0–36.0)
MCV: 84.6 fl (ref 78.0–100.0)
Monocytes Absolute: 0.7 10*3/uL (ref 0.1–1.0)
Monocytes Relative: 7.5 % (ref 3.0–12.0)
Neutro Abs: 6.9 10*3/uL (ref 1.4–7.7)
Neutrophils Relative %: 69.4 % (ref 43.0–77.0)
Platelets: 274 10*3/uL (ref 150.0–400.0)
RBC: 5.17 Mil/uL (ref 4.22–5.81)
RDW: 15.1 % (ref 11.5–15.5)
WBC: 9.9 10*3/uL (ref 4.0–10.5)

## 2021-09-23 LAB — COMPREHENSIVE METABOLIC PANEL
ALT: 28 U/L (ref 0–53)
AST: 19 U/L (ref 0–37)
Albumin: 4.2 g/dL (ref 3.5–5.2)
Alkaline Phosphatase: 85 U/L (ref 39–117)
BUN: 12 mg/dL (ref 6–23)
CO2: 28 mEq/L (ref 19–32)
Calcium: 9.6 mg/dL (ref 8.4–10.5)
Chloride: 104 mEq/L (ref 96–112)
Creatinine, Ser: 0.83 mg/dL (ref 0.40–1.50)
GFR: 94.27 mL/min (ref >60.00)
Glucose, Bld: 93 mg/dL (ref 70–99)
Potassium: 4.5 mEq/L (ref 3.5–5.1)
Sodium: 140 mEq/L (ref 135–145)
Total Bilirubin: 0.4 mg/dL (ref 0.2–1.2)
Total Protein: 7.5 g/dL (ref 6.0–8.3)

## 2021-09-23 LAB — LIPID PANEL
Cholesterol: 177 mg/dL (ref 0–200)
HDL: 35.2 mg/dL — ABNORMAL LOW (ref >39.00)
LDL Cholesterol: 120 mg/dL — ABNORMAL HIGH (ref 0–99)
NonHDL: 141.61
Total CHOL/HDL Ratio: 5
Triglycerides: 110 mg/dL (ref 0.0–149.0)
VLDL: 22 mg/dL (ref 0.0–40.0)

## 2021-09-23 LAB — VITAMIN D 25 HYDROXY (VIT D DEFICIENCY, FRACTURES): VITD: 35.74 ng/mL (ref 30.00–100.00)

## 2021-09-23 LAB — TSH: TSH: 2.02 u[IU]/mL (ref 0.35–5.50)

## 2021-09-23 LAB — HEMOGLOBIN A1C: Hgb A1c MFr Bld: 6.7 % — ABNORMAL HIGH (ref 4.6–6.5)

## 2021-09-23 NOTE — Patient Instructions (Signed)

## 2021-09-23 NOTE — Progress Notes (Signed)
[name] [name] 60 y.o.   Chief Complaint  Patient presents with   New Patient (Initial Visit)    Pt would like a physical    HISTORY OF PRESENT ILLNESS: This is a 60 y.o. male first visit to this office here to establish care and would like a physical. Has the following chronic medical problems: 1.  Morbid obesity 2.  Obstructive sleep apnea on CPAP treatment 3.  BPH scheduled for biopsy next month 4.  Chronic smoker 5.  Multinodular goiter 6.  History of vitamin D deficiency.   HPI   Prior to Admission medications   Medication Sig Start Date End Date Taking? Authorizing Provider  aspirin 81 MG tablet Take 81 mg by mouth daily as needed.    Yes [provider]  meloxicam (MOBIC) 7.5 MG tablet One daily with meals as needed for joint pain 06/04/21  Yes Tanda Rockers, MD  metFORMIN (GLUCOPHAGE) 500 MG tablet Take 1 tablet (500 mg total) by mouth every morning. Take 500 mg (1 tab po in am and 1/2 tablet po at bedtime) 06/23/21  Yes Dennard Nip D, MD  tamsulosin (FLOMAX) 0.4 MG CAPS capsule Take 0.4 mg by mouth.   Yes [provider]  Vitamin D, Ergocalciferol, (DRISDOL) 1.25 MG (50000 UNIT) CAPS capsule Take 1 capsule (50,000 Units total) by mouth every 7 (seven) days. 08/26/21  Yes Dennard Nip D, MD    No Known Allergies  Patient Active Problem List   Diagnosis Date Noted   Other hyperlipidemia 04/01/2021   Vitamin D deficiency 04/01/2021   Tobacco abuse 12/07/2018   Multinodular goiter 09/21/2012   Cigarette smoker 08/19/2010   BENIGN PROSTATIC HYPERTROPHY, HX OF 08/19/2010   HYPERCHOLESTEROLEMIA 04/25/2009   Morbid obesity ([place]) 09/06/2007   OBSTRUCTIVE SLEEP APNEA 09/06/2007    Past Medical History:  Diagnosis Date   Arthritis    Back pain    BPH (benign prostatic hyperplasia)    Epididymitis    Hyperlipidemia    Joint pain    Morbid obesity (HCC)    OSA (obstructive sleep apnea)    Sleep apnea    Upper airway cough syndrome      Past Surgical History:  Procedure Laterality Date   TESTICLE REMOVAL  age 66    Social History   Socioeconomic History   Marital status: Married    Spouse name: Not on file   Number of children: Not on file   Years of education: Not on file   Highest education level: Not on file  Occupational History   Occupation: Statistician: [employer]  Tobacco Use   Smoking status: Every Day    Packs/day: 0.50    Years: 25.00    Pack years: 12.50    Types: Cigarettes   Smokeless tobacco: Never   Tobacco comments:    1/2 pack per day 08/26/21  Vaping Use   Vaping Use: Never used  Substance and Sexual Activity   Alcohol use: Yes    Alcohol/week: 3.0 - 4.0 standard drinks    Types: 3 - 4 Standard drinks or equivalent per week   Drug use: No   Sexual activity: Never  Other Topics Concern   Not on file  Social History Narrative   Not on file   Social Determinants of Health   Financial Resource Strain: Not on file  Food Insecurity: Not on file  Transportation Needs: Not on file  Physical Activity: Not on file  Stress:  Not on file  Social Connections: Not on file  Intimate Partner Violence: Not on file    Family History  Problem Relation Age of Onset   Coronary artery disease Mother 72   Diabetes Mother    Heart disease Mother    Hyperlipidemia Mother    Heart failure Mother    Diabetes Father    Heart disease Father    Diabetes Brother        older brother   Cancer Neg Hx      Review of Systems  Constitutional: Negative.  Negative for chills and fever.  HENT: Negative.  Negative for congestion and sore throat.   Respiratory: Negative.  Negative for cough and shortness of breath.   Cardiovascular: Negative.  Negative for chest pain and palpitations.  Gastrointestinal: Negative.  Negative for abdominal pain, diarrhea, nausea and vomiting.  Genitourinary: Negative.  Negative for dysuria and hematuria.  Skin: Negative.  Negative for  rash.  Neurological: Negative.  Negative for dizziness and headaches.  All other systems reviewed and are negative.  Today's Vitals   09/23/21 0844  BP: 122/72  Pulse: 70  Temp: 98.4 F (36.9 C)  TempSrc: Oral  SpO2: 97%  Weight: 290 lb (131.5 kg)  Height: 5\' 11"  (1.803 m)   Body mass index is 40.45 kg/m.  Physical Exam Vitals reviewed.  Constitutional:      Appearance: Normal appearance. He is obese.  HENT:     Head: Normocephalic.     Right Ear: Tympanic membrane, ear canal and external ear normal.     Left Ear: Tympanic membrane, ear canal and external ear normal.     Mouth/Throat:     Mouth: Mucous membranes are moist.     Pharynx: Oropharynx is clear.  Eyes:     Extraocular Movements: Extraocular movements intact.     Conjunctiva/sclera: Conjunctivae normal.     Pupils: Pupils are equal, round, and reactive to light.  Cardiovascular:     Rate and Rhythm: Normal rate and regular rhythm.     Pulses: Normal pulses.     Heart sounds: Normal heart sounds.  Pulmonary:     Effort: Pulmonary effort is normal.     Breath sounds: Normal breath sounds.  Abdominal:     Palpations: Abdomen is soft.     Tenderness: There is no abdominal tenderness.  Musculoskeletal:        General: Normal range of motion.     Cervical back: Normal range of motion and neck supple. No tenderness.     Right lower leg: No edema.     Left lower leg: No edema.  Lymphadenopathy:     Cervical: No cervical adenopathy.  Skin:    General: Skin is warm and dry.     Capillary Refill: Capillary refill takes less than 2 seconds.  Neurological:     General: No focal deficit present.     Mental Status: He is alert and oriented to person, place, and time.  Psychiatric:        Mood and Affect: Mood normal.        Behavior: Behavior normal.     ASSESSMENT & PLAN: Problem List Items Addressed This Visit       Respiratory   OBSTRUCTIVE SLEEP APNEA     Endocrine   Multinodular goiter     Other    Morbid obesity ([place])   Relevant Orders   CBC with Differential/Platelet   Comprehensive metabolic panel   Hemoglobin A1c  Lipid panel   TSH   Cigarette smoker   Relevant Orders   Ambulatory Referral for Lung Cancer Scre   Other hyperlipidemia   Vitamin D deficiency   Other Visit Diagnoses     Encounter for general adult medical examination with abnormal findings    -  Primary   Encounter to establish care       Colon cancer screening       Relevant Orders   Ambulatory referral to Gastroenterology   Need for shingles vaccine       Relevant Orders   Varicella-zoster vaccine IM (Shingrix)   History of vitamin D deficiency       Relevant Orders   VITAMIN D 25 Hydroxy (Vit-D Deficiency, Fractures)        Modifiable risk factors discussed with patient. Anticipatory guidance according to age provided. The following topics were also discussed: Social Determinants of Health Smoking.  Cardiovascular and cancer risk associated with smoking discussed.  Smoking cessation advice given. Diet and nutrition and need to decrease amount of daily carbohydrate intake Benefits of exercise Cancer screening and need for colon cancer screening with colonoscopy.  Also will need chest CT for lung cancer screening. Vaccinations recommendations Cardiovascular risk assessment Mental health including depression and anxiety Fall and accident prevention  Patient Instructions  Health Maintenance, Male Adopting a healthy lifestyle and getting preventive care are important in promoting health and wellness. Ask your health care provider about: The right schedule for you to have regular tests and exams. Things you can do on your own to prevent diseases and keep yourself healthy. What should I know about diet, weight, and exercise? Eat a healthy diet  Eat a diet that includes plenty of vegetables, fruits, low-fat dairy products, and lean protein. Do not eat a lot of foods that are high in solid  fats, added sugars, or sodium. Maintain a healthy weight Body mass index (BMI) is a measurement that can be used to identify possible weight problems. It estimates body fat based on height and weight. Your health care provider can help determine your BMI and help you achieve or maintain a healthy weight. Get regular exercise Get regular exercise. This is one of the most important things you can do for your health. Most adults should: Exercise for at least 150 minutes each week. The exercise should increase your heart rate and make you sweat (moderate-intensity exercise). Do strengthening exercises at least twice a week. This is in addition to the moderate-intensity exercise. Spend less time sitting. Even light physical activity can be beneficial. Watch cholesterol and blood lipids Have your blood tested for lipids and cholesterol at 61 years of age, then have this test every 5 years. You may need to have your cholesterol levels checked more often if: Your lipid or cholesterol levels are high. You are older than 61 years of age. You are at high risk for heart disease. What should I know about cancer screening? Many types of cancers can be detected early and may often be prevented. Depending on your health history and family history, you may need to have cancer screening at various ages. This may include screening for: Colorectal cancer. Prostate cancer. Skin cancer. Lung cancer. What should I know about heart disease, diabetes, and high blood pressure? Blood pressure and heart disease High blood pressure causes heart disease and increases the risk of stroke. This is more likely to develop in people who have high blood pressure readings or are overweight. Talk with your health  care provider about your target blood pressure readings. Have your blood pressure checked: Every 3-5 years if you are 70-79 years of age. Every year if you are 12 years old or older. If you are between the ages of 74  and 67 and are a current or former smoker, ask your health care provider if you should have a one-time screening for abdominal aortic aneurysm (AAA). Diabetes Have regular diabetes screenings. This checks your fasting blood sugar level. Have the screening done: Once every three years after age 72 if you are at a normal weight and have a low risk for diabetes. More often and at a younger age if you are overweight or have a high risk for diabetes. What should I know about preventing infection? Hepatitis B If you have a higher risk for hepatitis B, you should be screened for this virus. Talk with your health care provider to find out if you are at risk for hepatitis B infection. Hepatitis C Blood testing is recommended for: Everyone born from [birth year] through 1966. Anyone with known risk factors for hepatitis C. Sexually transmitted infections (STIs) You should be screened each year for STIs, including gonorrhea and chlamydia, if: You are sexually active and are younger than 61 years of age. You are older than 61 years of age and your health care provider tells you that you are at risk for this type of infection. Your sexual activity has changed since you were last screened, and you are at increased risk for chlamydia or gonorrhea. Ask your health care provider if you are at risk. Ask your health care provider about whether you are at high risk for HIV. Your health care provider may recommend a prescription medicine to help prevent HIV infection. If you choose to take medicine to prevent HIV, you should first get tested for HIV. You should then be tested every 3 months for as long as you are taking the medicine. Follow these instructions at home: Alcohol use Do not drink alcohol if your health care provider tells you not to drink. If you drink alcohol: Limit how much you have to 0-2 drinks a day. Know how much alcohol is in your drink. In the [place] one drink equals one 12 oz bottle of beer (355 mL),  one 5 oz glass of wine (148 mL), or one 1 oz glass of hard liquor (44 mL). Lifestyle Do not use any products that contain nicotine or tobacco. These products include cigarettes, chewing tobacco, and vaping devices, such as e-cigarettes. If you need help quitting, ask your health care provider. Do not use street drugs. Do not share needles. Ask your health care provider for help if you need support or information about quitting drugs. General instructions Schedule regular health, dental, and eye exams. Stay current with your vaccines. Tell your health care provider if: You often feel depressed. You have ever been abused or do not feel safe at home. Summary Adopting a healthy lifestyle and getting preventive care are important in promoting health and wellness. Follow your health care provider's instructions about healthy diet, exercising, and getting tested or screened for diseases. Follow your health care provider's instructions on monitoring your cholesterol and blood pressure. This information is not intended to replace advice given to you by your health care provider. Make sure you discuss any questions you have with your health care provider. Document Revised: 03/17/2021 Document Reviewed: 03/17/2021 Elsevier Patient Education  2022 [place], MD [facility]  [facility]

## 2021-09-24 ENCOUNTER — Encounter (INDEPENDENT_AMBULATORY_CARE_PROVIDER_SITE_OTHER): Payer: Self-pay | Admitting: Family Medicine

## 2021-09-24 ENCOUNTER — Ambulatory Visit (INDEPENDENT_AMBULATORY_CARE_PROVIDER_SITE_OTHER): Payer: BC Managed Care – PPO | Admitting: Family Medicine

## 2021-09-24 VITALS — BP 116/75 | HR 78 | Temp 97.9°F | Ht 71.0 in | Wt 286.0 lb

## 2021-09-24 DIAGNOSIS — E1169 Type 2 diabetes mellitus with other specified complication: Secondary | ICD-10-CM | POA: Diagnosis not present

## 2021-09-24 DIAGNOSIS — Z6841 Body Mass Index (BMI) 40.0 and over, adult: Secondary | ICD-10-CM | POA: Diagnosis not present

## 2021-09-24 DIAGNOSIS — Z9189 Other specified personal risk factors, not elsewhere classified: Secondary | ICD-10-CM

## 2021-09-24 DIAGNOSIS — E559 Vitamin D deficiency, unspecified: Secondary | ICD-10-CM

## 2021-09-24 MED ORDER — METFORMIN HCL 500 MG PO TABS: 500.0000 mg | ORAL_TABLET | Freq: Every morning | ORAL | 0 refills | Status: DC

## 2021-09-24 MED ORDER — VITAMIN D (ERGOCALCIFEROL) 1.25 MG (50000 UNIT) PO CAPS: 50000.0000 [IU] | ORAL_CAPSULE | ORAL | 0 refills | Status: DC

## 2021-09-24 NOTE — Progress Notes (Signed)
Chief Complaint:   OBESITY [name] [name] is here to discuss his progress with his obesity treatment plan along with follow-up of his obesity related diagnoses. [name] [name] is on the Category 3 Plan and states he is following his eating plan approximately 85-90% of the time. [name] [name] states he is doing 0 minutes 0 times per week.  Today's visit was #: 9 Starting weight: 293 lbs Starting date: 03/27/2021 Today's weight: 286 lbs Today's date: 09/24/2021 Total lbs lost to date: 7 Total lbs lost since last in-office visit: 0  Interim History: [name] [name] is working on following his eating plan, but he has missed some meals and he feels his hunger is controlled but only sometimes.  Subjective:   1. Type 2 diabetes mellitus with other specified complication, without long-term current use of insulin (HCC) [name] [name] recent A1c was elevated at 6.7, even on his eating plan and metformin. He notes decreased polyphagia and some bloating at times.  2. Vitamin D deficiency [name] [name] is stable on Vit D, and he denies signs of over-replacement.  3. At risk for heart disease [name] [name] is at a higher than average risk for cardiovascular disease due to obesity.   Assessment/Plan:   1. Type 2 diabetes mellitus with other specified complication, without long-term current use of insulin (HCC) We will refill metformin for 1 month. [name] [name] is to work on decreasing simple carbohydrates and increase his protein. Will continue to follow closely. Good blood sugar control is important to decrease the likelihood of diabetic complications such as nephropathy, neuropathy, limb loss, blindness, coronary artery disease, and death. Intensive lifestyle modification including diet, exercise and weight loss are the first line of treatment for diabetes.   - metFORMIN (GLUCOPHAGE) 500 MG tablet; Take 1 tablet (500 mg total) by mouth every morning. Take 500 mg (1 tab po in am and 1/2 tablet po at bedtime)  Dispense: 30 tablet;  Refill: 0  2. Vitamin D deficiency Low Vitamin D level contributes to fatigue and are associated with obesity, breast, and colon cancer. [name] [name] will continue prescription Vitamin D 50,000 IU every week, especially with shorter days and we will refill for 1 month. He will follow-up for routine testing of Vitamin D, at least 2-3 times per year to avoid over-replacement.  - Vitamin D, Ergocalciferol, (DRISDOL) 1.25 MG (50000 UNIT) CAPS capsule; Take 1 capsule (50,000 Units total) by mouth every 7 (seven) days.  Dispense: 4 capsule; Refill: 0  3. At risk for heart disease [name] [name] was given approximately 15 minutes of coronary artery disease prevention counseling today. He is 60 y.o. male and has risk factors for heart disease including obesity. We discussed intensive lifestyle modifications today with an emphasis on specific weight loss instructions and strategies.   Repetitive spaced learning was employed today to elicit superior memory formation and behavioral change.  4. Obesity with current BMI 39.9 [name] [name] is currently in the action stage of change. As such, his goal is to continue with weight loss efforts. He has agreed to keeping a food journal and adhering to recommended goals of 1400-1700 calories and 100+ grams of protein daily.   Behavioral modification strategies: increasing lean protein intake, no skipping meals, and meal planning and cooking strategies.  [name] [name] has agreed to follow-up with our clinic in 3 to 4 weeks. He was informed of the importance of frequent follow-up visits to maximize his success with intensive lifestyle modifications for his multiple health conditions.   Objective:   Blood pressure 116/75, pulse 78, temperature 97.9 F (  36.6 C), height 5\' 11"  (1.803 m), weight 286 lb (129.7 kg), SpO2 98 %. Body mass index is 39.89 kg/m.  General: Cooperative, alert, well developed, in no acute distress. HEENT: Conjunctivae and lids unremarkable. Cardiovascular:  Regular rhythm.  Lungs: Normal work of breathing. Neurologic: No focal deficits.   Lab Results  Component Value Date   CREATININE 0.83 09/23/2021   BUN 12 09/23/2021   NA 140 09/23/2021   K 4.5 09/23/2021   CL 104 09/23/2021   CO2 28 09/23/2021   Lab Results  Component Value Date   ALT 28 09/23/2021   AST 19 09/23/2021   ALKPHOS 85 09/23/2021   BILITOT 0.4 09/23/2021   Lab Results  Component Value Date   HGBA1C 6.7 (H) 09/23/2021   HGBA1C 6.3 (H) 03/27/2021   HGBA1C 6.6 (H) 08/20/2020   HGBA1C 6.7 (H) 08/25/2019   HGBA1C 6.3 05/25/2019   Lab Results  Component Value Date   INSULIN 22.4 03/27/2021   Lab Results  Component Value Date   TSH 2.02 09/23/2021   Lab Results  Component Value Date   CHOL 177 09/23/2021   HDL 35.20 (L) 09/23/2021   LDLCALC 120 (H) 09/23/2021   LDLDIRECT 136.0 09/02/2016   TRIG 110.0 09/23/2021   CHOLHDL 5 09/23/2021   Lab Results  Component Value Date   VD25OH 35.74 09/23/2021   VD25OH 19.0 (L) 03/27/2021   Lab Results  Component Value Date   WBC 9.9 09/23/2021   HGB 14.3 09/23/2021   HCT 43.7 09/23/2021   MCV 84.6 09/23/2021   PLT 274.0 09/23/2021   No results found for: IRON, TIBC, FERRITIN  Attestation Statements:   Reviewed by clinician on day of visit: allergies, medications, problem list, medical history, surgical history, family history, social history, and previous encounter notes.   I, Trixie Dredge, am acting as transcriptionist for Dennard Nip, MD.  I have reviewed the above documentation for accuracy and completeness, and I agree with the above. -  Dennard Nip, MD

## 2021-10-14 ENCOUNTER — Other Ambulatory Visit: Payer: Self-pay

## 2021-10-14 DIAGNOSIS — F17210 Nicotine dependence, cigarettes, uncomplicated: Secondary | ICD-10-CM

## 2021-10-14 DIAGNOSIS — Z87891 Personal history of nicotine dependence: Secondary | ICD-10-CM

## 2021-10-15 ENCOUNTER — Ambulatory Visit (INDEPENDENT_AMBULATORY_CARE_PROVIDER_SITE_OTHER): Payer: BC Managed Care – PPO | Admitting: Family Medicine

## 2021-10-22 ENCOUNTER — Ambulatory Visit (INDEPENDENT_AMBULATORY_CARE_PROVIDER_SITE_OTHER): Payer: BC Managed Care – PPO | Admitting: Family Medicine

## 2021-11-05 NOTE — Progress Notes (Signed)
I, Wendy Poet, LAT, ATC, am serving as scribe for Dr. Lynne Leader.  Subjective:    CC:L hip pain  HPI: Pt is a 60 y/o male c/o L hip pain x 7-8 years. Pt locates pain to his his L post-lat hip w/ radiating pain into his L lateral thigh and calf.  Low back pain: intermittently yes but not currently LE numbness/tingling: No Radiating pain: yes into his L lateral thigh and lower leg Aggravates: prolonged sitting; driving; sitting on his wallet on the L side Treatments tried:meloxicam; Tylenol  Pertinent review of Systems: No fevers or chills  Relevant historical information: Obesity.  Sleep apnea.   Objective:    Vitals:   11/06/21 1425  BP: 112/70  Pulse: 70  SpO2: 95%   General: Well Developed, well nourished, and in no acute distress.   MSK: L-spine nontender midline normal lumbar motion. Negative slump test. Reflexes and sensation are intact and equal bilaterally. Left hip normal-appearing Tender to palpation greater trochanter. Hip abduction and external rotation strength diminished 4/5.   Lab and Radiology Results  Hip greater trochanteric injection: Left Consent obtained and timeout performed. Area of maximum tenderness palpated and identified. Skin cleaned with alcohol, cold spray applied. A spinal needle was used to access the greater trochanteric bursa. 40mg  of Kenalog and 2 mL of Marcaine were used to inject the trochanteric bursa. Patient tolerated the procedure well.  X-ray images L-spine and left hip obtained today personally and independently interpreted  L-spine: DDD L5-S1. No acute fracture.  Left hip: Significant DJD.  No acute fractures.  Await formal radiology review    Impression and Recommendations:    Assessment and Plan: 60 y.o. male with left lateral hip pain predominantly due to greater trochanteric bursitis.  He does have DJD seen on hip x-ray but his majority of his pain is lateral thought to be bursitis tendinitis related  and not due to the DJD.  Plan for trial of physical therapy.  Recheck in about 6 to 8 weeks.  If not improved certainly there is more to do.  Currently working with Dr. Leafy Ro for weight loss which will be helpful ultimately if he does need a hip replacement.[name]  PDMP not reviewed this encounter. Orders Placed This Encounter  Procedures   [place] LIMITED JOINT SPACE STRUCTURES LOW LEFT(NO LINKED CHARGES)    Order Specific Question:   Reason for Exam (SYMPTOM  OR DIAGNOSIS REQUIRED)    Answer:   L hip pain    Order Specific Question:   Preferred imaging location?    Answer:   [facility]   DG HIP UNILAT W OR W/O PELVIS 2-3 VIEWS LEFT    Standing Status:   Future    Number of Occurrences:   1    Standing Expiration Date:   12/07/2021    Order Specific Question:   Reason for Exam (SYMPTOM  OR DIAGNOSIS REQUIRED)    Answer:   L hip pain    Order Specific Question:   Preferred imaging location?    Answer:   [facility]   DG Lumbar Spine 2-3 Views    Standing Status:   Future    Number of Occurrences:   1    Standing Expiration Date:   12/07/2021    Order Specific Question:   Reason for Exam (SYMPTOM  OR DIAGNOSIS REQUIRED)    Answer:   L leg pain    Order Specific Question:   Preferred imaging location?  Answer:   [facility]   Ambulatory referral to Physical Therapy    Referral Priority:   Routine    Referral Type:   Physical Medicine    Referral Reason:   Specialty Services Required    Requested Specialty:   Physical Therapy    Number of Visits Requested:   1   No orders of the defined types were placed in this encounter.   Discussed warning signs or symptoms. Please see discharge instructions. Patient expresses understanding.   The above documentation has been reviewed and is accurate and complete Lynne Leader, M.D.

## 2021-11-06 ENCOUNTER — Ambulatory Visit (INDEPENDENT_AMBULATORY_CARE_PROVIDER_SITE_OTHER): Payer: BC Managed Care – PPO

## 2021-11-06 ENCOUNTER — Ambulatory Visit: Payer: Self-pay

## 2021-11-06 ENCOUNTER — Encounter: Payer: Self-pay | Admitting: Family Medicine

## 2021-11-06 ENCOUNTER — Other Ambulatory Visit: Payer: Self-pay

## 2021-11-06 ENCOUNTER — Ambulatory Visit: Payer: BC Managed Care – PPO | Admitting: Family Medicine

## 2021-11-06 VITALS — BP 112/70 | HR 70 | Ht 71.0 in | Wt 295.6 lb

## 2021-11-06 DIAGNOSIS — M79605 Pain in left leg: Secondary | ICD-10-CM

## 2021-11-06 DIAGNOSIS — M25552 Pain in left hip: Secondary | ICD-10-CM

## 2021-11-06 DIAGNOSIS — G8929 Other chronic pain: Secondary | ICD-10-CM | POA: Diagnosis not present

## 2021-11-06 DIAGNOSIS — M545 Low back pain, unspecified: Secondary | ICD-10-CM

## 2021-11-06 NOTE — Patient Instructions (Addendum)
Nice to meet you.  I've referred you to Physical Therapy.  Their office will call you to schedule put please let us know if you don't hear from them by the end of next week.  Please get an Xray today before you leave.  Follow-up: 8 weeks

## 2021-11-11 NOTE — Progress Notes (Signed)
Left hip shows significant arthritis. This is a cause of some of your hip pain.

## 2021-11-11 NOTE — Progress Notes (Signed)
Lumbar spine x-ray does show some arthritis changes in your low back.

## 2021-11-19 ENCOUNTER — Encounter: Payer: BC Managed Care – PPO | Admitting: Acute Care

## 2021-11-21 ENCOUNTER — Inpatient Hospital Stay: Admission: RE | Admit: 2021-11-21 | Payer: BC Managed Care – PPO | Source: Ambulatory Visit

## 2021-11-24 ENCOUNTER — Encounter: Payer: Self-pay | Admitting: Physical Therapy

## 2021-11-24 ENCOUNTER — Other Ambulatory Visit: Payer: Self-pay

## 2021-11-24 ENCOUNTER — Ambulatory Visit: Payer: BC Managed Care – PPO | Admitting: Physical Therapy

## 2021-11-24 DIAGNOSIS — M25552 Pain in left hip: Secondary | ICD-10-CM | POA: Diagnosis not present

## 2021-11-24 DIAGNOSIS — M6281 Muscle weakness (generalized): Secondary | ICD-10-CM | POA: Diagnosis not present

## 2021-11-24 DIAGNOSIS — G8929 Other chronic pain: Secondary | ICD-10-CM

## 2021-11-24 DIAGNOSIS — R262 Difficulty in walking, not elsewhere classified: Secondary | ICD-10-CM | POA: Diagnosis not present

## 2021-11-24 DIAGNOSIS — M545 Low back pain, unspecified: Secondary | ICD-10-CM | POA: Diagnosis not present

## 2021-11-24 NOTE — Therapy (Addendum)
[facility] Physical Therapy [address] [place], [place], [address] Phone: [phone]   Fax:  [phone]  Physical Therapy Evaluation/ Discharge Patient Details  Name: [name] [name] MRN: [id number] Date of Birth: 08/30/1961 Referring Provider (PT): Lynne Leader, MD   Encounter Date: 11/24/2021   PT End of Session - 11/24/21 1057     Visit Number 1    Number of Visits 13    Date for PT Re-Evaluation 01/09/22    PT Start Time 0849    PT Stop Time 0931    PT Time Calculation (min) 42 min    Activity Tolerance Patient tolerated treatment well    Behavior During Therapy [facility] for tasks assessed/performed             Past Medical History:  Diagnosis Date   Arthritis    Back pain    BPH (benign prostatic hyperplasia)    Epididymitis    Hyperlipidemia    Joint pain    Morbid obesity ([place])    OSA (obstructive sleep apnea)    Sleep apnea    Upper airway cough syndrome     Past Surgical History:  Procedure Laterality Date   TESTICLE REMOVAL  age 59    There were no vitals filed for this visit.    Subjective Assessment - 11/24/21 0854     Subjective Pt stating his left hip and low back pain began around 2015/2016. Pt stating he is sleeping fine. Pt stating sometimes his pain radiates down the left leg causing tingling into his left foot.  Pt stated he went [place] and they attemtped an MRI but he couldn't tolerate beging in the machine. Pt stating he transfered his services to [facility] and received an injection which helped short term. Pt stating he began exercising and then stopped over the summer his pain returned. Pt stating he has a history of left hip dislocation while playing football.    Pertinent History arthritis, back pain, BPH, obesity, sleep apnea, joint pain, h/o left hip dislocation at age [age] per pt report.    Patient Stated Goals Stop hurting    Currently in Pain? 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  No      Home Social worker Private residence    Living Arrangements Spouse/significant other;Children  Type of Home House    Home Access Stairs to enter    Entrance Stairs-Number of Steps 4-5    Entrance Stairs-Rails Can reach both      Prior Function   Level of Independence Independent    Vocation Full time employment    Vocation Requirements working at desk and entering home, having to walk and climb stairs, pt is a Microbiologist Status Within Functional Limits for tasks assessed      Observation/Other Assessments   Focus on Therapeutic Outcomes (FOTO)  47% (predicted 55%)      ROM / Strength   AROM / PROM / Strength AROM;Strength      AROM   AROM Assessment Site Hip;Knee;Lumbar    Right/Left Hip Right;Left    Right Hip Flexion 100    Left Hip Flexion 98    Right/Left Knee Right;Left    Right Knee Extension 0    Right Knee Flexion 120    Left Knee Extension 0    Left Knee Flexion 122    Lumbar Flexion 60    Lumbar Extension 10    Lumbar - Right Side Bend 26     Lumbar - Left Side Bend 24    Lumbar - Right Rotation WFL with mild pain/tightness    Lumbar - Left Rotation WFL with mild tightness noted      Strength   Strength Assessment Site Hip;Knee    Right/Left Hip Right;Left    Right Hip Flexion 5/5    Right Hip ABduction 5/5    Right Hip ADduction 4/5    Left Hip Flexion 4/5    Left Hip ABduction 4/5    Left Hip ADduction 4/5    Right/Left Knee Right;Left    Right Knee Flexion 5/5    Right Knee Extension 5/5    Left Knee Flexion 5/5    Left Knee Extension 5/5      Palpation   Palpation comment TTP: left lumbar paraspinals and left QL      Special Tests   Other special tests negative slump test bilaterally, negative SLR bilaterally      Transfers   Five time sit to stand comments  18 seconds with UE support      Ambulation/Gait   Assistive device None    Gait Pattern Step-through pattern    Ambulation Surface Level    Gait Comments mild decrease wt shifting to left hip                        Objective measurements completed on examination: See above findings.                PT Education - 11/24/21 1056     Education Details PT POC, HEP    Person(s) Educated Patient    Methods Explanation;Demonstration;Handout    Comprehension Verbalized understanding;Returned demonstration              PT Short Term Goals - 11/24/21 1058       PT SHORT TERM GOAL #1   Title Pt will be independent in his initial HEP.    Time 3    Period Weeks    Status New    Target Date 12/19/21      PT SHORT TERM GOAL #2   Title Pt will improve his 5 time sit to stand to < 14 seconds with no UE support.  Baseline 17 seconds with UE support    Time 3    Period Weeks    Status New    Target Date 12/19/21               PT Long Term Goals - 11/24/21 1220       PT LONG TERM GOAL #1   Title Pt will be independent in his advanced HEP.    Time 6    Period Weeks    Status New    Target Date 01/09/22       PT LONG TERM GOAL #2   Title Pt will be able to demonstrate bending to pick up 10# object from floor using correct body mechanics with pain </= 3/10.    Time 6    Period Weeks    Status New    Target Date 01/09/22      PT LONG TERM GOAL #3   Title Pt will be able to report sitting >/= 60 minutes with pain </= 2/10 in low back.    Time 6    Period Weeks    Status New    Target Date 01/09/22      PT LONG TERM GOAL #4   Title Pt will be able to navigate 1 flight of stairs with single hand rail with step over step pattern with pain </= 2/10 in left hip.    Time 6    Period Weeks    Status New    Target Date 01/09/22      PT LONG TERM GOAL #5   Title Pt will improve his left hip strength to 5/5 to improve functional mobility and gait.    Time 6    Period Weeks    Status New    Target Date 01/09/22      Additional Long Term Goals   Additional Long Term Goals Yes      PT LONG TERM GOAL #6   Title Pt will improve his FOTO to >/= 55%.    Baseline 47% at eval    Time 6    Period Weeks    Status New    Target Date 01/09/22                    Plan - 11/24/21 1235     Clinical Impression Statement Pt arriving today for evaluation of chronic low back pain and left hip pain. Pt stating at times his pain can radiate down his left LE causing tingling in his toes. Pt presenting with decreased ROM in in his low back and hamstrings. Pt with strength deficits in left LE when compared to his right.  Pt was edu in HEP and handout issued.  Skilled PT needed to address pt's impairments with below interventions.    Personal Factors and Comorbidities Comorbidity 3+    Comorbidities arthritis, back pain, BPH, obesity, sleep apnea, joint pain,    Examination-Activity Limitations Lift;Stairs;Squat;Sit;Bend    Examination-Participation Restrictions Community Activity;Occupation;Other    Stability/Clinical Decision Making Stable/Uncomplicated    Clinical Decision Making Moderate     Rehab Potential Good    PT Frequency 2x / week    PT Duration 6 weeks    PT Treatment/Interventions ADLs/Self Care Home Management;Cryotherapy;Electrical Stimulation;Moist Heat;Traction;Ultrasound;Balance training;Therapeutic exercise;Therapeutic activities;Functional mobility training;Stair training;Gait training;Neuromuscular re-education;Patient/family education;Passive range of motion;Manual lymph drainage;Manual techniques;Taping;Dry needling;Spinal Manipulations;Joint Manipulations    PT Next Visit Plan Nustep, LE stretching, strengthening, core strenthening, core strengthening    PT Home Exercise Plan  Access Code: [email or web address]  URL: [email or web address]  Date: 11/24/2021  Prepared by: Kearney Hard    Exercises  Supine Bridge - 2 x daily - 7 x weekly - 2 sets - 10 reps - 5 seconds hold  Supine Lower Trunk Rotation - 2 x daily - 7 x weekly - 3 reps - 20 seconds hold  Supine Active Straight Leg Raise - 2 x daily - 7 x weekly - 2 sets - 10 reps - 1-2 seconds hold  Modified Thomas Stretch - 2 x daily - 7 x weekly - 1-2 minutes hold  Hooklying Single Knee to Chest Stretch - 2 x daily - 7 x weekly - 3 reps - 20 seconds hold    Consulted and Agree with Plan of Care Patient             Patient will benefit from skilled therapeutic intervention in order to improve the following deficits and impairments:  Pain, Postural dysfunction, Decreased strength, Decreased balance, Impaired flexibility, Decreased activity tolerance, Difficulty walking, Obesity, Decreased range of motion  Visit Diagnosis: Pain in left hip  Chronic bilateral low back pain without sciatica  Difficulty in walking, not elsewhere classified  Muscle weakness (generalized)     Problem List Patient Active Problem List   Diagnosis Date Noted   Other hyperlipidemia 04/01/2021   Vitamin D deficiency 04/01/2021   Tobacco abuse 12/07/2018   Multinodular goiter 09/21/2012   Cigarette smoker 08/19/2010   BENIGN  PROSTATIC HYPERTROPHY, HX OF 08/19/2010   HYPERCHOLESTEROLEMIA 04/25/2009   Morbid obesity ([place]) 09/06/2007   OBSTRUCTIVE SLEEP APNEA 09/06/2007    Oretha Caprice, PT, MPT  11/24/2021, 12:54 PM  [facility] [address] [place], [place], [address] Phone: [phone]   Fax:  [phone]  Name: [name] [name] MRN: [id number] Date of Birth: May 22, 1961 PHYSICAL THERAPY DISCHARGE SUMMARY  Visits from Start of Care: 1  Current functional level related to goals / functional outcomes: See above   Remaining deficits: See above   Education / Equipment: HEP   Patient agrees to discharge. Patient goals were not met. Patient is being discharged due to not returning since the last visit.

## 2021-11-24 NOTE — Patient Instructions (Signed)
Access Code: [email or web address] URL: [email or web address] Date: 11/24/2021 Prepared by: Kearney Hard  Exercises Supine Bridge - 2 x daily - 7 x weekly - 2 sets - 10 reps - 5 seconds hold Supine Lower Trunk Rotation - 2 x daily - 7 x weekly - 3 reps - 20 seconds hold Supine Active Straight Leg Raise - 2 x daily - 7 x weekly - 2 sets - 10 reps - 1-2 seconds hold Modified Thomas Stretch - 2 x daily - 7 x weekly - 1-2 minutes hold Hooklying Single Knee to Chest Stretch - 2 x daily - 7 x weekly - 3 reps - 20 seconds hold

## 2022-01-01 ENCOUNTER — Ambulatory Visit: Payer: BC Managed Care – PPO | Admitting: Family Medicine

## 2022-01-01 ENCOUNTER — Encounter: Payer: Self-pay | Admitting: Internal Medicine

## 2022-02-11 ENCOUNTER — Ambulatory Visit
Admission: EM | Admit: 2022-02-11 | Discharge: 2022-02-11 | Disposition: A | Discharge status: Home / Self Care | Payer: BC Managed Care – PPO | Attending: Physician Assistant | Admitting: Physician Assistant

## 2022-02-11 DIAGNOSIS — J069 Acute upper respiratory infection, unspecified: Secondary | ICD-10-CM

## 2022-02-11 MED ORDER — AZITHROMYCIN 250 MG PO TABS: 250.0000 mg | ORAL_TABLET | Freq: Every day | ORAL | 0 refills | Status: DC

## 2022-02-11 MED ORDER — PREDNISONE 20 MG PO TABS: 40.0000 mg | ORAL_TABLET | Freq: Every day | ORAL | 0 refills | Status: AC

## 2022-02-11 NOTE — ED Provider Notes (Signed)
?[facility] ? ? ? ?CSN: [id number] ?Arrival date & time: 02/11/22  0820 ? ? ?  ? ?History   ?Chief Complaint ?Chief Complaint  ?Patient presents with  ? Shortness of Breath  ? ? ?HPI ?[name] [name] is a 61 y.o. male.  ? ?Patient is here today for evaluation of cough, shortness of breath, congestion that started a few days ago. He reports his oxygen seems to be a little lower than typically. He has not had any sore throat, ear pain, headache, nausea, vomiting, diarrhea and constipation. He has not had fever.  ? ?The history is provided by the patient.  ? ?Past Medical History:  ?Diagnosis Date  ? Arthritis   ? Back pain   ? BPH (benign prostatic hyperplasia)   ? Epididymitis   ? Hyperlipidemia   ? Joint pain   ? Morbid obesity ([place])   ? OSA (obstructive sleep apnea)   ? Sleep apnea   ? Upper airway cough syndrome   ? ? ?Patient Active Problem List  ? Diagnosis Date Noted  ? Other hyperlipidemia 04/01/2021  ? Vitamin D deficiency 04/01/2021  ? Tobacco abuse 12/07/2018  ? Multinodular goiter 09/21/2012  ? Cigarette smoker 08/19/2010  ? BENIGN PROSTATIC HYPERTROPHY, HX OF 08/19/2010  ? HYPERCHOLESTEROLEMIA 04/25/2009  ? Morbid obesity ([place]) 09/06/2007  ? OBSTRUCTIVE SLEEP APNEA 09/06/2007  ? ? ?Past Surgical History:  ?Procedure Laterality Date  ? TESTICLE REMOVAL  age 19  ? ? ? ? ? ?Home Medications   ? ?Prior to Admission medications   ?Medication Sig Start Date End Date Taking? Authorizing Provider  ?azithromycin (ZITHROMAX) 250 MG tablet Take 1 tablet (250 mg total) by mouth daily. Take first 2 tablets together, then 1 every day until finished. 02/11/22  Yes Francene Finders, PA-C  ?predniSONE (DELTASONE) 20 MG tablet Take 2 tablets (40 mg total) by mouth daily with breakfast for 5 days. 02/11/22 02/16/22 Yes Francene Finders, PA-C  ?aspirin 81 MG tablet Take 81 mg by mouth daily as needed.     [provider]  ?meloxicam (MOBIC) 7.5 MG tablet One daily with meals as needed for joint pain 06/04/21    Tanda Rockers, MD  ?tamsulosin (FLOMAX) 0.4 MG CAPS capsule Take 0.4 mg by mouth.    [provider]  ?Vitamin D, Ergocalciferol, (DRISDOL) 1.25 MG (50000 UNIT) CAPS capsule Take 1 capsule (50,000 Units total) by mouth every 7 (seven) days. 09/24/21   Starlyn Skeans, MD  ? ? ?Family History ?Family History  ?Problem Relation Age of Onset  ? Coronary artery disease Mother 66  ? Diabetes Mother   ? Heart disease Mother   ? Hyperlipidemia Mother   ? Heart failure Mother   ? Diabetes Father   ? Heart disease Father   ? Diabetes Brother   ?     older brother  ? Cancer Neg Hx   ? ? ?Social History ?Social History  ? ?Tobacco Use  ? Smoking status: Every Day  ?  Packs/day: 0.50  ?  Years: 25.00  ?  Pack years: 12.50  ?  Types: Cigarettes  ? Smokeless tobacco: Never  ? Tobacco comments:  ?  1/2 pack per day 08/26/21  ?Vaping Use  ? Vaping Use: Never used  ?Substance Use Topics  ? Alcohol use: Yes  ?  Alcohol/week: 3.0 - 4.0 standard drinks  ?  Types: 3 - 4 Standard drinks or equivalent per week  ? Drug use: No  ? ? ? ?  Allergies   ?Patient has no known allergies. ? ? ?Review of Systems ?Review of Systems  ?Constitutional:  Negative for chills and fever.  ?HENT:  Positive for congestion. Negative for ear pain and sore throat.   ?Eyes:  Negative for discharge and redness.  ?Respiratory:  Positive for cough and shortness of breath.   ?Gastrointestinal:  Negative for abdominal pain, diarrhea, nausea and vomiting.  ? ? ?Physical Exam ?Triage Vital Signs ?ED Triage Vitals  ?Enc Vitals Group  ?   BP   ?   Pulse   ?   Resp   ?   Temp   ?   Temp src   ?   SpO2   ?   Weight   ?   Height   ?   Head Circumference   ?   Peak Flow   ?   Pain Score   ?   Pain Loc   ?   Pain Edu?   ?   Excl. in [place]?   ? ?No data found. ? ?Updated Vital Signs ?BP 116/74   Pulse 81   Temp 97.9 ?F (36.6 ?C) (Oral)   Resp 18   SpO2 93%  ?   ? ?Physical Exam ?Vitals and nursing note reviewed.  ?Constitutional:   ?   General: He is not in acute  distress. ?   Appearance: Normal appearance. He is not ill-appearing.  ?HENT:  ?   Head: Normocephalic and atraumatic.  ?   Nose: Congestion present.  ?   Mouth/Throat:  ?   Mouth: Mucous membranes are moist.  ?   Pharynx: Oropharynx is clear. No oropharyngeal exudate or posterior oropharyngeal erythema.  ?Eyes:  ?   Conjunctiva/sclera: Conjunctivae normal.  ?Cardiovascular:  ?   Rate and Rhythm: Normal rate and regular rhythm.  ?   Heart sounds: Normal heart sounds. No murmur heard. ?Pulmonary:  ?   Effort: Pulmonary effort is normal. No respiratory distress.  ?   Breath sounds: Normal breath sounds. No wheezing, rhonchi or rales.  ?Skin: ?   General: Skin is warm and dry.  ?Neurological:  ?   Mental Status: He is alert.  ?Psychiatric:     ?   Mood and Affect: Mood normal.     ?   Thought Content: Thought content normal.  ? ? ? ?UC Treatments / Results  ?Labs ?(all labs ordered are listed, but only abnormal results are displayed) ?Labs Reviewed  ?COVID-19, FLU A+B NAA  ? ? ?EKG ? ? ?Radiology ?No results found. ? ?Procedures ?Procedures (including critical care time) ? ?Medications Ordered in UC ?Medications - No data to display ? ?Initial Impression / Assessment and Plan / UC Course  ?I have reviewed the triage vital signs and the nursing notes. ? ?Pertinent labs & imaging results that were available during my care of the patient were reviewed by me and considered in my medical decision making (see chart for details). ? ? Suspect viral etiology of symptoms and will screen for Covid and Flu- given history of smoking will treat to cover COPD exacerbation although no clear diagnosis of COPD noted. Recommend follow up if symptoms fail to improve or worsen.  ? ? ?Final Clinical Impressions(s) / UC Diagnoses  ? ?Final diagnoses:  ?Acute upper respiratory infection  ? ?Discharge Instructions   ?None ?  ? ?ED Prescriptions   ? ? Medication Sig Dispense Auth. Provider  ? predniSONE (DELTASONE) 20 MG tablet Take 2 tablets  (40 mg  total) by mouth daily with breakfast for 5 days. 10 tablet Francene Finders, PA-C  ? azithromycin (ZITHROMAX) 250 MG tablet Take 1 tablet (250 mg total) by mouth daily. Take first 2 tablets together, then 1 every day until finished. 6 tablet Francene Finders, PA-C  ? ?  ? ?PDMP not reviewed this encounter. ?  ?Francene Finders, PA-C ?02/12/22 [id number] ? ?

## 2022-02-11 NOTE — ED Triage Notes (Signed)
Pt c/o cough, sob, malaise, nasal congestion,  ? ?Denies sore throat, ear ache, headache, nausea, vomiting, diarrhea, constipation  ? ?Onset ~ Saturday  ? ?States has o2 monitor at home. Usually in high 80's in AM the mid 95's throughout day. Spo2 in clinic was 91% after ambulation. 94% at rest.  ?

## 2022-02-13 LAB — COVID-19, FLU A+B NAA
Influenza A, NAA: NOT DETECTED
Influenza B, NAA: NOT DETECTED
SARS-CoV-2, NAA: NOT DETECTED

## 2022-02-26 ENCOUNTER — Telehealth: Payer: Self-pay | Admitting: *Deleted

## 2022-02-26 ENCOUNTER — Encounter: Payer: BC Managed Care – PPO | Admitting: Internal Medicine

## 2022-02-26 NOTE — Telephone Encounter (Signed)
Patient no showed PV today- Called patient and left message to return call by 5 pm today- If no call by 5 pm, PV and procedure will be canceled - no call at 5 pm -  PV and Procedure both canceled- No Show letter mailed to patient  

## 2022-02-27 ENCOUNTER — Ambulatory Visit (INDEPENDENT_AMBULATORY_CARE_PROVIDER_SITE_OTHER): Payer: BC Managed Care – PPO | Admitting: Family Medicine

## 2022-02-27 ENCOUNTER — Encounter: Payer: Self-pay | Admitting: Family Medicine

## 2022-02-27 ENCOUNTER — Ambulatory Visit (INDEPENDENT_AMBULATORY_CARE_PROVIDER_SITE_OTHER): Payer: BC Managed Care – PPO

## 2022-02-27 VITALS — BP 130/82 | HR 75 | Temp 97.6°F | Ht 71.0 in | Wt 293.0 lb

## 2022-02-27 DIAGNOSIS — H6122 Impacted cerumen, left ear: Secondary | ICD-10-CM | POA: Diagnosis not present

## 2022-02-27 DIAGNOSIS — Z8701 Personal history of pneumonia (recurrent): Secondary | ICD-10-CM

## 2022-02-27 DIAGNOSIS — F17200 Nicotine dependence, unspecified, uncomplicated: Secondary | ICD-10-CM

## 2022-02-27 DIAGNOSIS — R058 Other specified cough: Secondary | ICD-10-CM

## 2022-02-27 MED ORDER — AMOXICILLIN-POT CLAVULANATE 875-125 MG PO TABS: 1.0000 | ORAL_TABLET | Freq: Two times a day (BID) | ORAL | 0 refills | Status: DC

## 2022-02-27 NOTE — Progress Notes (Signed)
Subjective: ? [name] [name] is a 61 y.o. male who presents for a 3 week history of URI symptoms including fatigue, ear fullness, productive cough and chest congestion. States he had shortness of breath yesterday. States his oxygen level at home was in the 80% range for a few seconds and then would go back up in the 90s fairly quickly.  ?He is a smoker.  ? ?States he completed a Z-pak and steroids 2 weeks ago after going to the ED for evaluation.  ?States he felt better for a few days after completing the medications but then his symptoms returned.  ? ?Hx of pneumonia. He has diabetes.  ? ?Denies fever, chills, dizziness, headache,  chest pain, palpitations, abdominal pain, vomiting or diarrhea. No LE edema or leg pain.  ? ?Taking Tylenol, Zyrtec.  ? ?Denies sick contacts.  No other aggravating or relieving factors.  No other c/o. ? ?ROS as in subjective. ? ? ?Objective: ?Vitals:  ? 02/27/22 0824  ?BP: 130/82  ?Pulse: 75  ?Temp: 97.6 ?F (36.4 ?C)  ?SpO2: 96%  ? ? ?General appearance: Alert, WD/WN, no distress, mildly ill appearing ?                            Skin: warm, no rash ?                          Head: no sinus tenderness ?                           Eyes: conjunctiva normal, corneas clear, PERRLA ?                           Ears: pearly TM and normal canal on the right, cerumen impaction on the left. Left TM normal post lavage.  ?                         ?            Mouth/throat: mask on  ?                          Neck: supple, no adenopathy, non tender ?                         Heart: RRR ?                        Lungs: CTA bilaterally, no wheezes, rales, or rhonchi ?  Lower Extremities: soft, non tender calves bilaterally without edema.  ?    ? ?Assessment: ?Cough present for greater than 3 weeks - Plan: DG Chest 2 View, amoxicillin-clavulanate (AUGMENTIN) 875-125 MG tablet ? ?History of pneumonia - Plan: DG Chest 2 View ? ?Smoker - Plan: DG Chest 2 View ? ?Impacted cerumen of left  ear ? ? ?Plan: ?Benign exam. Chest XR ordered. Augmentin prescribed.   Suggested symptomatic OTC remedies including Mucinex and Tylenol.  ?Nasal saline spray for congestion.    Avoid using Q-tips in ears. Ear lavage performed by CMA, Jarrett Soho. He tolerated this well and reported immediate improvement. Call/return if worsening or not back to baseline after completing the antibiotic.  ?    ? ?

## 2022-02-27 NOTE — Addendum Note (Signed)
Addended by: Rossie Muskrat on: 02/27/2022 09:06 AM ? ? Modules accepted: Orders ? ?

## 2022-02-27 NOTE — Patient Instructions (Signed)
Before you leave today, go to the first floor for your chest x-ray. ? ?Take the antibiotic, Augmentin, as prescribed. ? ?I also recommend taking Mucinex since you have this at home.  Drink plenty of water. ? ?Avoid using Q-tips in your ears. ? ?Follow-up if you are getting worse or not back to baseline when you complete the antibiotic. ? ?We will be in touch with your x-ray result ?

## 2022-03-02 NOTE — Progress Notes (Signed)
Please schedule a follow up visit for patient for 3 to 4 weeks after he completes his antibiotic per the radiologist recommendation. He will need repeat imaging. He can see his PCP or me for this.  Please ask him if he is improving or not.

## 2022-03-20 ENCOUNTER — Encounter: Payer: BC Managed Care – PPO | Admitting: Internal Medicine

## 2022-03-24 ENCOUNTER — Ambulatory Visit: Payer: BC Managed Care – PPO | Admitting: Emergency Medicine

## 2022-03-31 ENCOUNTER — Ambulatory Visit (INDEPENDENT_AMBULATORY_CARE_PROVIDER_SITE_OTHER): Payer: BC Managed Care – PPO

## 2022-03-31 ENCOUNTER — Telehealth: Payer: Self-pay | Admitting: *Deleted

## 2022-03-31 ENCOUNTER — Ambulatory Visit: Payer: BC Managed Care – PPO | Admitting: Emergency Medicine

## 2022-03-31 ENCOUNTER — Encounter: Payer: Self-pay | Admitting: Emergency Medicine

## 2022-03-31 DIAGNOSIS — R7303 Prediabetes: Secondary | ICD-10-CM | POA: Diagnosis not present

## 2022-03-31 DIAGNOSIS — Z1211 Encounter for screening for malignant neoplasm of colon: Secondary | ICD-10-CM

## 2022-03-31 DIAGNOSIS — M25561 Pain in right knee: Secondary | ICD-10-CM | POA: Diagnosis not present

## 2022-03-31 DIAGNOSIS — Z23 Encounter for immunization: Secondary | ICD-10-CM

## 2022-03-31 DIAGNOSIS — G8929 Other chronic pain: Secondary | ICD-10-CM | POA: Diagnosis not present

## 2022-03-31 DIAGNOSIS — F17210 Nicotine dependence, cigarettes, uncomplicated: Secondary | ICD-10-CM

## 2022-03-31 LAB — LIPID PANEL
Cholesterol: 174 mg/dL (ref 0–200)
HDL: 29.8 mg/dL — ABNORMAL LOW (ref >39.00)
LDL Cholesterol: 106 mg/dL — ABNORMAL HIGH (ref 0–99)
NonHDL: 144.49
Total CHOL/HDL Ratio: 6
Triglycerides: 194 mg/dL — ABNORMAL HIGH (ref 0.0–149.0)
VLDL: 38.8 mg/dL (ref 0.0–40.0)

## 2022-03-31 LAB — COMPREHENSIVE METABOLIC PANEL
ALT: 35 U/L (ref 0–53)
AST: 22 U/L (ref 0–37)
Albumin: 4.1 g/dL (ref 3.5–5.2)
Alkaline Phosphatase: 76 U/L (ref 39–117)
BUN: 14 mg/dL (ref 6–23)
CO2: 30 mEq/L (ref 19–32)
Calcium: 10 mg/dL (ref 8.4–10.5)
Chloride: 104 mEq/L (ref 96–112)
Creatinine, Ser: 0.88 mg/dL (ref 0.40–1.50)
GFR: 92.29 mL/min (ref >60.00)
Glucose, Bld: 92 mg/dL (ref 70–99)
Potassium: 4.2 mEq/L (ref 3.5–5.1)
Sodium: 139 mEq/L (ref 135–145)
Total Bilirubin: 0.3 mg/dL (ref 0.2–1.2)
Total Protein: 7.3 g/dL (ref 6.0–8.3)

## 2022-03-31 LAB — HEMOGLOBIN A1C: Hgb A1c MFr Bld: 6.3 % (ref 4.6–6.5)

## 2022-03-31 MED ORDER — WEGOVY 1 MG/0.5ML ~~LOC~~ SOAJ: 1.0000 mg | SUBCUTANEOUS | 7 refills | Status: DC

## 2022-03-31 NOTE — Assessment & Plan Note (Signed)
Chronic and affecting quality of life.  We will get an x-ray today.  Needs orthopedic evaluation.  Referral placed today.

## 2022-03-31 NOTE — Progress Notes (Signed)
[name] [name] 61 y.o.   Chief Complaint  Patient presents with   Follow-up   Knee Pain    Right knee pain     HISTORY OF PRESENT ILLNESS: This is a 61 y.o. male here for follow-up.  Has the following chronic medical problems: 1. Morbid obesity.  No longer seeing weight management clinic.  Was tried on metformin but no success. 2. Obstructive sleep apnea on CPAP treatment 3. BPH 4. Chronic smoker 5. Multinodular goiter 6. History of vitamin D deficiency.   Today also complaining of chronic right knee pain. No other complaints or medical concerns today.  Knee Pain     Prior to Admission medications   Medication Sig Start Date End Date Taking? Authorizing Provider  aspirin 81 MG tablet Take 81 mg by mouth daily as needed.    Yes [provider]  meloxicam (MOBIC) 7.5 MG tablet One daily with meals as needed for joint pain 06/04/21  Yes Tanda Rockers, MD  tamsulosin (FLOMAX) 0.4 MG CAPS capsule Take 0.4 mg by mouth.   Yes [provider]  amoxicillin-clavulanate (AUGMENTIN) 875-125 MG tablet Take 1 tablet by mouth 2 (two) times daily. Patient not taking: Reported on 03/31/2022 02/27/22   Girtha Rm, NP-C  Vitamin D, Ergocalciferol, (DRISDOL) 1.25 MG (50000 UNIT) CAPS capsule Take 1 capsule (50,000 Units total) by mouth every 7 (seven) days. Patient not taking: Reported on 03/31/2022 09/24/21   Starlyn Skeans, MD    No Known Allergies  Patient Active Problem List   Diagnosis Date Noted   Other hyperlipidemia 04/01/2021   Vitamin D deficiency 04/01/2021   Tobacco abuse 12/07/2018   Multinodular goiter 09/21/2012   Cigarette smoker 08/19/2010   BENIGN PROSTATIC HYPERTROPHY, HX OF 08/19/2010   HYPERCHOLESTEROLEMIA 04/25/2009   Morbid obesity ([place]) 09/06/2007   OBSTRUCTIVE SLEEP APNEA 09/06/2007    Past Medical History:  Diagnosis Date   Arthritis    Back pain    BPH (benign prostatic hyperplasia)    Epididymitis    Hyperlipidemia     Joint pain    Morbid obesity ([facility])    OSA (obstructive sleep apnea)    Sleep apnea    Upper airway cough syndrome     Past Surgical History:  Procedure Laterality Date   TESTICLE REMOVAL  age 80    Social History   Socioeconomic History   Marital status: Married    Spouse name: Not on file   Number of children: Not on file   Years of education: Not on file   Highest education level: Not on file  Occupational History   Occupation: Statistician: [employer]  Tobacco Use   Smoking status: Every Day    Packs/day: 0.50    Years: 25.00    Pack years: 12.50    Types: Cigarettes   Smokeless tobacco: Never   Tobacco comments:    1/2 pack per day 08/26/21  Vaping Use   Vaping Use: Never used  Substance and Sexual Activity   Alcohol use: Yes    Alcohol/week: 3.0 - 4.0 standard drinks    Types: 3 - 4 Standard drinks or equivalent per week   Drug use: No   Sexual activity: Never  Other Topics Concern   Not on file  Social History Narrative   Not on file   Social Determinants of Health   Financial Resource Strain: Not on file  Food Insecurity: Not on file  Transportation Needs: Not  on file  Physical Activity: Not on file  Stress: Not on file  Social Connections: Not on file  Intimate Partner Violence: Not on file    Family History  Problem Relation Age of Onset   Coronary artery disease Mother 75   Diabetes Mother    Heart disease Mother    Hyperlipidemia Mother    Heart failure Mother    Diabetes Father    Heart disease Father    Diabetes Brother        older brother   Cancer Neg Hx      Review of Systems  Constitutional: Negative.  Negative for chills and fever.  HENT: Negative.  Negative for congestion and sore throat.   Respiratory: Negative.  Negative for cough and shortness of breath.   Cardiovascular: Negative.  Negative for chest pain and palpitations.  Gastrointestinal:  Negative for abdominal pain, diarrhea, nausea  and vomiting.  Genitourinary: Negative.  Negative for dysuria and hematuria.  Musculoskeletal: Negative.   Skin: Negative.  Negative for rash.  Neurological: Negative.  Negative for dizziness and headaches.  All other systems reviewed and are negative. Today's Vitals   03/31/22 1314  BP: 118/76  Pulse: 79  Temp: 98 F (36.7 C)  TempSrc: Oral  SpO2: 92%  Weight: 292 lb 8 oz (132.7 kg)  Height: '5\' 11"'$  (1.803 m)   Body mass index is 40.8 kg/m.   Physical Exam Vitals reviewed.  Constitutional:      Appearance: Normal appearance. He is obese.  HENT:     Head: Normocephalic.     Mouth/Throat:     Mouth: Mucous membranes are moist.     Pharynx: Oropharynx is clear.  Eyes:     Extraocular Movements: Extraocular movements intact.     Conjunctiva/sclera: Conjunctivae normal.     Pupils: Pupils are equal, round, and reactive to light.  Cardiovascular:     Rate and Rhythm: Normal rate and regular rhythm.     Pulses: Normal pulses.     Heart sounds: Normal heart sounds.  Pulmonary:     Effort: Pulmonary effort is normal.     Breath sounds: Normal breath sounds.  Musculoskeletal:        General: Normal range of motion.     Cervical back: No tenderness.  Lymphadenopathy:     Cervical: No cervical adenopathy.  Skin:    General: Skin is warm and dry.     Capillary Refill: Capillary refill takes less than 2 seconds.  Neurological:     General: No focal deficit present.     Mental Status: He is alert and oriented to person, place, and time.  Psychiatric:        Mood and Affect: Mood normal.        Behavior: Behavior normal.     ASSESSMENT & PLAN: A total of 47 minutes was spent with the patient and counseling/coordination of care regarding preparing for this visit, review of most recent office visit notes, review of all medications, review of available medical records, morbid obesity and treatment, weight management options including pharmacological intervention with Wegovy,  education on nutrition, smoking cessation advice, review of health maintenance items, chronic right knee pain and management including need for orthopedic evaluation, review of most recent blood work results, prognosis, cardiovascular risks associated with morbid obesity, documentation, need for follow-up.  Problem List Items Addressed This Visit       Other   Morbid obesity ([place]) - Primary    Diet and nutrition discussed.  Advised to reduced number of daily calories and amount of daily carbohydrate intake. May benefit from weekly Wegovy 1 mg.  Prescription sent to pharmacy of record. Follow-up in 6 months.       Relevant Medications   Semaglutide-Weight Management (WEGOVY) 1 MG/0.5ML SOAJ   Other Relevant Orders   Comprehensive metabolic panel   Hemoglobin A1c   Lipid panel   Cigarette smoker    Cardiovascular/cancer risk associated with smoking discussed.  Smoking cessation advice given.       Prediabetes   Relevant Orders   Hemoglobin A1c   Lipid panel   Chronic pain of right knee    Chronic and affecting quality of life.  We will get an x-ray today.  Needs orthopedic evaluation.  Referral placed today.       Relevant Orders   DG Knee Complete 4 Views Right   Ambulatory referral to Orthopedic Surgery   Other Visit Diagnoses     Need for vaccination       Relevant Orders   Tdap vaccine greater than or equal to 7yo IM (Completed)   Varicella-zoster vaccine IM (Shingrix) (Completed)   Colon cancer screening       Relevant Orders   Ambulatory referral to Gastroenterology        Patient Instructions  Health Maintenance, Male Adopting a healthy lifestyle and getting preventive care are important in promoting health and wellness. Ask your health care provider about: The right schedule for you to have regular tests and exams. Things you can do on your own to prevent diseases and keep yourself healthy. What should I know about diet, weight, and exercise? Eat a  healthy diet  Eat a diet that includes plenty of vegetables, fruits, low-fat dairy products, and lean protein. Do not eat a lot of foods that are high in solid fats, added sugars, or sodium. Maintain a healthy weight Body mass index (BMI) is a measurement that can be used to identify possible weight problems. It estimates body fat based on height and weight. Your health care provider can help determine your BMI and help you achieve or maintain a healthy weight. Get regular exercise Get regular exercise. This is one of the most important things you can do for your health. Most adults should: Exercise for at least 150 minutes each week. The exercise should increase your heart rate and make you sweat (moderate-intensity exercise). Do strengthening exercises at least twice a week. This is in addition to the moderate-intensity exercise. Spend less time sitting. Even light physical activity can be beneficial. Watch cholesterol and blood lipids Have your blood tested for lipids and cholesterol at 62 years of age, then have this test every 5 years. You may need to have your cholesterol levels checked more often if: Your lipid or cholesterol levels are high. You are older than 62 years of age. You are at high risk for heart disease. What should I know about cancer screening? Many types of cancers can be detected early and may often be prevented. Depending on your health history and family history, you may need to have cancer screening at various ages. This may include screening for: Colorectal cancer. Prostate cancer. Skin cancer. Lung cancer. What should I know about heart disease, diabetes, and high blood pressure? Blood pressure and heart disease High blood pressure causes heart disease and increases the risk of stroke. This is more likely to develop in people who have high blood pressure readings or are overweight. Talk with your health care  provider about your target blood pressure  readings. Have your blood pressure checked: Every 3-5 years if you are 15-60 years of age. Every year if you are 25 years old or older. If you are between the ages of 73 and 72 and are a current or former smoker, ask your health care provider if you should have a one-time screening for abdominal aortic aneurysm (AAA). Diabetes Have regular diabetes screenings. This checks your fasting blood sugar level. Have the screening done: Once every three years after age 38 if you are at a normal weight and have a low risk for diabetes. More often and at a younger age if you are overweight or have a high risk for diabetes. What should I know about preventing infection? Hepatitis B If you have a higher risk for hepatitis B, you should be screened for this virus. Talk with your health care provider to find out if you are at risk for hepatitis B infection. Hepatitis C Blood testing is recommended for: Everyone born from [birth year] through 1965. Anyone with known risk factors for hepatitis C. Sexually transmitted infections (STIs) You should be screened each year for STIs, including gonorrhea and chlamydia, if: You are sexually active and are younger than 62 years of age. You are older than 61 years of age and your health care provider tells you that you are at risk for this type of infection. Your sexual activity has changed since you were last screened, and you are at increased risk for chlamydia or gonorrhea. Ask your health care provider if you are at risk. Ask your health care provider about whether you are at high risk for HIV. Your health care provider may recommend a prescription medicine to help prevent HIV infection. If you choose to take medicine to prevent HIV, you should first get tested for HIV. You should then be tested every 3 months for as long as you are taking the medicine. Follow these instructions at home: Alcohol use Do not drink alcohol if your health care provider tells you not to  drink. If you drink alcohol: Limit how much you have to 0-2 drinks a day. Know how much alcohol is in your drink. In the [place] one drink equals one 12 oz bottle of beer (355 mL), one 5 oz glass of wine (148 mL), or one 1 oz glass of hard liquor (44 mL). Lifestyle Do not use any products that contain nicotine or tobacco. These products include cigarettes, chewing tobacco, and vaping devices, such as e-cigarettes. If you need help quitting, ask your health care provider. Do not use street drugs. Do not share needles. Ask your health care provider for help if you need support or information about quitting drugs. General instructions Schedule regular health, dental, and eye exams. Stay current with your vaccines. Tell your health care provider if: You often feel depressed. You have ever been abused or do not feel safe at home. Summary Adopting a healthy lifestyle and getting preventive care are important in promoting health and wellness. Follow your health care provider's instructions about healthy diet, exercising, and getting tested or screened for diseases. Follow your health care provider's instructions on monitoring your cholesterol and blood pressure. This information is not intended to replace advice given to you by your health care provider. Make sure you discuss any questions you have with your health care provider. Document Revised: 03/17/2021 Document Reviewed: 03/17/2021 Elsevier Patient Education  [place], MD [facility]

## 2022-03-31 NOTE — Assessment & Plan Note (Signed)
Diet and nutrition discussed.  Advised to reduced number of daily calories and amount of daily carbohydrate intake. May benefit from weekly Wegovy 1 mg.  Prescription sent to pharmacy of record. Follow-up in 6 months.

## 2022-03-31 NOTE — Telephone Encounter (Signed)
PA for [facility] submitted, awaiting response  Key: [id number]

## 2022-03-31 NOTE — Assessment & Plan Note (Signed)
Cardiovascular/cancer risk associated with smoking discussed. Smoking cessation advice given 

## 2022-03-31 NOTE — Patient Instructions (Signed)
Health Maintenance, Male Adopting a healthy lifestyle and getting preventive care are important in promoting health and wellness. Ask your health care provider about: The right schedule for you to have regular tests and exams. Things you can do on your own to prevent diseases and keep yourself healthy. What should I know about diet, weight, and exercise? Eat a healthy diet  Eat a diet that includes plenty of vegetables, fruits, low-fat dairy products, and lean protein. Do not eat a lot of foods that are high in solid fats, added sugars, or sodium. Maintain a healthy weight Body mass index (BMI) is a measurement that can be used to identify possible weight problems. It estimates body fat based on height and weight. Your health care provider can help determine your BMI and help you achieve or maintain a healthy weight. Get regular exercise Get regular exercise. This is one of the most important things you can do for your health. Most adults should: Exercise for at least 150 minutes each week. The exercise should increase your heart rate and make you sweat (moderate-intensity exercise). Do strengthening exercises at least twice a week. This is in addition to the moderate-intensity exercise. Spend less time sitting. Even light physical activity can be beneficial. Watch cholesterol and blood lipids Have your blood tested for lipids and cholesterol at 61 years of age, then have this test every 5 years. You may need to have your cholesterol levels checked more often if: Your lipid or cholesterol levels are high. You are older than 62 years of age. You are at high risk for heart disease. What should I know about cancer screening? Many types of cancers can be detected early and may often be prevented. Depending on your health history and family history, you may need to have cancer screening at various ages. This may include screening for: Colorectal cancer. Prostate cancer. Skin cancer. Lung  cancer. What should I know about heart disease, diabetes, and high blood pressure? Blood pressure and heart disease High blood pressure causes heart disease and increases the risk of stroke. This is more likely to develop in people who have high blood pressure readings or are overweight. Talk with your health care provider about your target blood pressure readings. Have your blood pressure checked: Every 3-5 years if you are 18-39 years of age. Every year if you are 40 years old or older. If you are between the ages of 65 and 75 and are a current or former smoker, ask your health care provider if you should have a one-time screening for abdominal aortic aneurysm (AAA). Diabetes Have regular diabetes screenings. This checks your fasting blood sugar level. Have the screening done: Once every three years after age 45 if you are at a normal weight and have a low risk for diabetes. More often and at a younger age if you are overweight or have a high risk for diabetes. What should I know about preventing infection? Hepatitis B If you have a higher risk for hepatitis B, you should be screened for this virus. Talk with your health care provider to find out if you are at risk for hepatitis B infection. Hepatitis C Blood testing is recommended for: Everyone born from 1945 through 1965. Anyone with known risk factors for hepatitis C. Sexually transmitted infections (STIs) You should be screened each year for STIs, including gonorrhea and chlamydia, if: You are sexually active and are younger than 62 years of age. You are older than 62 years of age and your   health care provider tells you that you are at risk for this type of infection. Your sexual activity has changed since you were last screened, and you are at increased risk for chlamydia or gonorrhea. Ask your health care provider if you are at risk. Ask your health care provider about whether you are at high risk for HIV. Your health care provider  may recommend a prescription medicine to help prevent HIV infection. If you choose to take medicine to prevent HIV, you should first get tested for HIV. You should then be tested every 3 months for as long as you are taking the medicine. Follow these instructions at home: Alcohol use Do not drink alcohol if your health care provider tells you not to drink. If you drink alcohol: Limit how much you have to 0-2 drinks a day. Know how much alcohol is in your drink. In the [place] one drink equals one 12 oz bottle of beer (355 mL), one 5 oz glass of wine (148 mL), or one 1 oz glass of hard liquor (44 mL). Lifestyle Do not use any products that contain nicotine or tobacco. These products include cigarettes, chewing tobacco, and vaping devices, such as e-cigarettes. If you need help quitting, ask your health care provider. Do not use street drugs. Do not share needles. Ask your health care provider for help if you need support or information about quitting drugs. General instructions Schedule regular health, dental, and eye exams. Stay current with your vaccines. Tell your health care provider if: You often feel depressed. You have ever been abused or do not feel safe at home. Summary Adopting a healthy lifestyle and getting preventive care are important in promoting health and wellness. Follow your health care provider's instructions about healthy diet, exercising, and getting tested or screened for diseases. Follow your health care provider's instructions on monitoring your cholesterol and blood pressure. This information is not intended to replace advice given to you by your health care provider. Make sure you discuss any questions you have with your health care provider. Document Revised: 03/17/2021 Document Reviewed: 03/17/2021 Elsevier Patient Education  2023 Elsevier Inc.  

## 2022-04-01 NOTE — Telephone Encounter (Signed)
PA for wegovy is approved

## 2022-04-07 ENCOUNTER — Telehealth: Payer: Self-pay | Admitting: Emergency Medicine

## 2022-04-07 ENCOUNTER — Other Ambulatory Visit: Payer: Self-pay | Admitting: Emergency Medicine

## 2022-04-07 MED ORDER — VITAMIN D3 50 MCG (2000 UT) PO CAPS: 2000.0000 [IU] | ORAL_CAPSULE | Freq: Every day | ORAL | 1 refills | Status: AC

## 2022-04-07 NOTE — Telephone Encounter (Signed)
Called patient and left message in reference to vitamin D medication being sent to pharmacy on file.

## 2022-04-07 NOTE — Telephone Encounter (Signed)
Spouse called in and is requesting rx for Vitamin D be pescribed. States pt forgot to ask at time of last appt.   Please call pt. Once the medication has been rx or with any additional questions.

## 2022-04-07 NOTE — Telephone Encounter (Signed)
New prescription for vitamin D 2000 daily units sent to pharmacy of record.  Thanks.

## 2022-04-08 ENCOUNTER — Ambulatory Visit (INDEPENDENT_AMBULATORY_CARE_PROVIDER_SITE_OTHER): Payer: BC Managed Care – PPO

## 2022-04-08 ENCOUNTER — Ambulatory Visit
Admission: EM | Admit: 2022-04-08 | Discharge: 2022-04-08 | Disposition: A | Discharge status: Home / Self Care | Payer: BC Managed Care – PPO | Attending: Urgent Care | Admitting: Urgent Care

## 2022-04-08 DIAGNOSIS — R0602 Shortness of breath: Secondary | ICD-10-CM | POA: Diagnosis not present

## 2022-04-08 DIAGNOSIS — Z20822 Contact with and (suspected) exposure to covid-19: Secondary | ICD-10-CM | POA: Diagnosis not present

## 2022-04-08 DIAGNOSIS — R051 Acute cough: Secondary | ICD-10-CM | POA: Diagnosis not present

## 2022-04-08 DIAGNOSIS — R059 Cough, unspecified: Secondary | ICD-10-CM

## 2022-04-08 NOTE — ED Provider Notes (Signed)
[facility]    CSN: [id number] Arrival date & time: 04/08/22  1514      History   Chief Complaint Chief Complaint  Patient presents with  . Cough    HPI [name] [name] is a 61 y.o. male.   Pleasant 61yo male presents today due to concerns of cough, low grade fever, rhinorrhea and headache since Monday. States his wife took a covid test at home on Monday and was positive. Pt took one on Monday and Tuesday and was negative both times. States his head and ribs hurt because of the cough. Tmax was 101 yesterday, low grade temp today. He is intermittently coughing up phlegm, but no blood. He has hx of OSA, wears CPAP at night. Feels like he is sluggish and minimally short of breath. He has not tried any OTC medications. He was seen on 03/01/22 and had a CXR showing possible bronchopneumonia, with recommendations for a repeat CXR in 3-4 weeks. Pt has not yet completed the repeat imaging. He took a course of azithromycin and Augmentin in April.    Cough Associated symptoms: fever, headaches, rhinorrhea and shortness of breath    Past Medical History:  Diagnosis Date  . Arthritis   . Back pain   . BPH (benign prostatic hyperplasia)   . Epididymitis   . Hyperlipidemia   . Joint pain   . Morbid obesity ([place])   . OSA (obstructive sleep apnea)   . Sleep apnea   . Upper airway cough syndrome     Patient Active Problem List   Diagnosis Date Noted  . Prediabetes 03/31/2022  . Chronic pain of right knee 03/31/2022  . Other hyperlipidemia 04/01/2021  . Vitamin D deficiency 04/01/2021  . Tobacco abuse 12/07/2018  . Multinodular goiter 09/21/2012  . Cigarette smoker 08/19/2010  . BENIGN PROSTATIC HYPERTROPHY, HX OF 08/19/2010  . HYPERCHOLESTEROLEMIA 04/25/2009  . Morbid obesity ([place]) 09/06/2007  . OBSTRUCTIVE SLEEP APNEA 09/06/2007    Past Surgical History:  Procedure Laterality Date  . TESTICLE REMOVAL  age 51       Home Medications    Prior to Admission  medications   Medication Sig Start Date End Date Taking? Authorizing Provider  aspirin 81 MG tablet Take 81 mg by mouth daily as needed.     [provider]  Cholecalciferol (VITAMIN D3) 50 MCG (2000 UT) capsule Take 1 capsule (2,000 Units total) by mouth daily. 04/07/22 10/04/22  Horald Pollen, MD  meloxicam [facility]) 7.5 MG tablet One daily with meals as needed for joint pain 06/04/21   Tanda Rockers, MD  Semaglutide-Weight Management [facility]) 1 MG/0.5ML SOAJ Inject 1 mg into the skin once a week. 03/31/22   Horald Pollen, MD  tamsulosin (FLOMAX) 0.4 MG CAPS capsule Take 0.4 mg by mouth.    [provider]    Family History Family History  Problem Relation Age of Onset  . Coronary artery disease Mother 9  . Diabetes Mother   . Heart disease Mother   . Hyperlipidemia Mother   . Heart failure Mother   . Diabetes Father   . Heart disease Father   . Diabetes Brother        older brother  . Cancer Neg Hx     Social History Social History   Tobacco Use  . Smoking status: Every Day    Packs/day: 0.50    Years: 25.00    Pack years: 12.50    Types: Cigarettes  . Smokeless tobacco: Never  .  Tobacco comments:    1/2 pack per day 08/26/21  Vaping Use  . Vaping Use: Never used  Substance Use Topics  . Alcohol use: Yes    Alcohol/week: 3.0 - 4.0 standard drinks    Types: 3 - 4 Standard drinks or equivalent per week  . Drug use: No     Allergies   Patient has no known allergies.   Review of Systems Review of Systems  Constitutional:  Positive for fatigue and fever.  HENT:  Positive for congestion and rhinorrhea.   Respiratory:  Positive for cough and shortness of breath.   Neurological:  Positive for headaches.  All other systems reviewed and are negative.   Physical Exam Triage Vital Signs ED Triage Vitals  Enc Vitals Group     BP 04/08/22 1605 124/81     Pulse Rate 04/08/22 1605 80     Resp 04/08/22 1605 18     Temp 04/08/22 1605  99.4 F (37.4 C)     Temp Source 04/08/22 1605 Oral     SpO2 04/08/22 1605 90 %     Weight --      Height --      Head Circumference --      Peak Flow --      Pain Score 04/08/22 1608 0     Pain Loc --      Pain Edu? --      Excl. in [place]? --    No data found.  Updated Vital Signs BP 124/81 (BP Location: Left Arm)   Pulse 80   Temp 99.4 F (37.4 C) (Oral)   Resp 18   SpO2 90%   Visual Acuity Right Eye Distance:   Left Eye Distance:   Bilateral Distance:    Right Eye Near:   Left Eye Near:    Bilateral Near:     Physical Exam   UC Treatments / Results  Labs (all labs ordered are listed, but only abnormal results are displayed) Labs Reviewed - No data to display  EKG   Radiology No results found.  Procedures Procedures (including critical care time)  Medications Ordered in UC Medications - No data to display  Initial Impression / Assessment and Plan / UC Course  I have reviewed the triage vital signs and the nursing notes.  Pertinent labs & imaging results that were available during my care of the patient were reviewed by me and considered in my medical decision making (see chart for details).  Clinical Course as of 04/08/22 1801  Wed Apr 08, 2022  1727 O2 fluctuated between 88-96% [WC]    Clinical Course User Index [WC] Chaney Malling, [place]    *** Final Clinical Impressions(s) / UC Diagnoses   Final diagnoses:  None   Discharge Instructions   None    ED Prescriptions   None    PDMP not reviewed this encounter.

## 2022-04-08 NOTE — Discharge Instructions (Addendum)
Your chest xray does not show a pneumonia or any acute process. I am suspecting your covid test to be positive. Please review the side effects and risks of paxlovid on their website to determine if you would like to try this investigational medication should your test result in a positive. Treatment would need to be started before Saturday morning. Avoid close contact with others, please self isolate at home. Quercetin and Vit C OTC can be used to help immune system. Mucinex can loosen phlegm. If you develop severe shortness of breath or any new and worsening symptoms, please head to the ER.

## 2022-04-08 NOTE — ED Triage Notes (Signed)
Pt present coughing with congestion and SOB, symptoms started on Monday. Pt states his wife took a at home covid test and result was positive. Pt took an at home covid test results  negative.

## 2022-04-09 ENCOUNTER — Telehealth (HOSPITAL_COMMUNITY): Payer: Self-pay | Admitting: Emergency Medicine

## 2022-04-09 LAB — NOVEL CORONAVIRUS, NAA: SARS-CoV-2, NAA: DETECTED — AB

## 2022-04-09 MED ORDER — NIRMATRELVIR/RITONAVIR (PAXLOVID)TABLET: 3.0000 | ORAL_TABLET | Freq: Two times a day (BID) | ORAL | 0 refills | Status: AC

## 2022-04-09 NOTE — Telephone Encounter (Signed)
OPened in error.

## 2022-05-28 ENCOUNTER — Telehealth: Payer: Self-pay | Admitting: Emergency Medicine

## 2022-05-28 MED ORDER — SEMAGLUTIDE-WEIGHT MANAGEMENT 1.7 MG/0.75ML ~~LOC~~ SOAJ
1.7000 mg | SUBCUTANEOUS | 5 refills | Status: DC
Start: 2022-05-28 — End: 2022-12-22

## 2022-05-28 NOTE — Telephone Encounter (Signed)
Pt states regular dosage is on back order at pharmacy pt states pharmacist recommends 1.7    Semaglutide-Weight Managemen(WEGOVY) 1 MG/0.5ML [place], [place] Phone:  [phone]  Fax:  [phone]

## 2022-05-28 NOTE — Telephone Encounter (Signed)
Okay to use dose recommended by pharmacist 1.7 mg weekly

## 2022-05-28 NOTE — Telephone Encounter (Signed)
Notified pt sent new dose to pof.Marland KitchenJohny [name]

## 2022-06-17 ENCOUNTER — Encounter (INDEPENDENT_AMBULATORY_CARE_PROVIDER_SITE_OTHER): Payer: Self-pay

## 2022-07-14 ENCOUNTER — Emergency Department (HOSPITAL_COMMUNITY)
Admission: EM | Admit: 2022-07-14 | Discharge: 2022-07-14 | Disposition: A | Discharge status: Home / Self Care | Payer: BC Managed Care – PPO | Attending: Emergency Medicine | Admitting: Emergency Medicine

## 2022-07-14 ENCOUNTER — Other Ambulatory Visit: Payer: Self-pay

## 2022-07-14 ENCOUNTER — Encounter (HOSPITAL_COMMUNITY): Payer: Self-pay | Admitting: Emergency Medicine

## 2022-07-14 DIAGNOSIS — Z5321 Procedure and treatment not carried out due to patient leaving prior to being seen by health care provider: Secondary | ICD-10-CM | POA: Diagnosis not present

## 2022-07-14 DIAGNOSIS — R339 Retention of urine, unspecified: Secondary | ICD-10-CM | POA: Diagnosis not present

## 2022-07-14 DIAGNOSIS — R3989 Other symptoms and signs involving the genitourinary system: Secondary | ICD-10-CM | POA: Insufficient documentation

## 2022-07-14 NOTE — ED Triage Notes (Signed)
Patient reports urinary retention / bladder pressure , last voided Saturday .

## 2022-07-14 NOTE — ED Notes (Signed)
PATIENT LEFT AMA 

## 2022-07-25 ENCOUNTER — Other Ambulatory Visit: Payer: Self-pay

## 2022-07-25 ENCOUNTER — Emergency Department (HOSPITAL_BASED_OUTPATIENT_CLINIC_OR_DEPARTMENT_OTHER)
Admission: EM | Admit: 2022-07-25 | Discharge: 2022-07-25 | Disposition: A | Discharge status: Home / Self Care | Payer: BC Managed Care – PPO | Attending: Emergency Medicine | Admitting: Emergency Medicine

## 2022-07-25 ENCOUNTER — Encounter (HOSPITAL_BASED_OUTPATIENT_CLINIC_OR_DEPARTMENT_OTHER): Payer: Self-pay

## 2022-07-25 DIAGNOSIS — R338 Other retention of urine: Secondary | ICD-10-CM

## 2022-07-25 DIAGNOSIS — R339 Retention of urine, unspecified: Secondary | ICD-10-CM | POA: Diagnosis present

## 2022-07-25 DIAGNOSIS — Z7982 Long term (current) use of aspirin: Secondary | ICD-10-CM | POA: Diagnosis not present

## 2022-07-25 LAB — URINALYSIS, ROUTINE W REFLEX MICROSCOPIC
Bilirubin Urine: NEGATIVE
Glucose, UA: NEGATIVE mg/dL
Hgb urine dipstick: NEGATIVE
Ketones, ur: NEGATIVE mg/dL
Leukocytes,Ua: NEGATIVE
Nitrite: NEGATIVE
Protein, ur: NEGATIVE mg/dL
Specific Gravity, Urine: 1.019 (ref 1.005–1.030)
pH: 6.5 (ref 5.0–8.0)

## 2022-07-25 NOTE — ED Provider Notes (Signed)
[facility] EMERGENCY DEPT Provider Note   CSN: [id number] Arrival date & time: 07/25/22  4235     History  Chief Complaint  Patient presents with   Urinary Retention    [name] [name] is a 61 y.o. male.  HPI   61 year old male with medical history significant for BPH and urinary retention who presents to the emergency department with inability to urinate.  He states that he was seen in clinic at [facility] urology yesterday and had a Foley catheter removed.  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  He states that since 0 300 this morning, he has been completely unable to empty his bladder.  He endorsed suprapubic pain and discomfort.  On arrival, the patient had a bladder scan significant for 441 cc in the bladder and a Foley catheter was subsequently replaced.  He denies any dysuria, fevers or chills.  He denies any other complaints.  He feels symptomatically improved following Foley catheter placement.  Home Medications Prior to Admission medications   Medication Sig Start Date End Date Taking? Authorizing Provider  aspirin 81 MG tablet Take 81 mg by mouth daily as needed.     [provider]  Cholecalciferol (VITAMIN D3) 50 MCG (2000 UT) capsule Take 1 capsule (2,000 Units total) by mouth daily. 04/07/22 10/04/22  Horald Pollen, MD  meloxicam [facility]) 7.5 MG tablet One daily with meals as needed for joint pain 06/04/21   Tanda Rockers, MD  Semaglutide-Weight Management 1.7 MG/0.75ML SOAJ Inject 1.7 mg into the skin once a week. 05/28/22   Horald Pollen, MD  tamsulosin (FLOMAX) 0.4 MG CAPS capsule Take 0.4 mg by mouth.    [provider]      Allergies    Patient has no known allergies.    Review of Systems   Review of Systems  All other systems reviewed and are negative.   Physical Exam Updated Vital Signs BP 113/70 (BP Location: Right Arm)   Pulse 72   Temp 98.5 F (36.9 C) (Oral)   Resp 16   Ht '5\' 11"'$   (1.803 m)   Wt 132.5 kg   SpO2 100%   BMI 40.73 kg/m  Physical Exam Vitals and nursing note reviewed.  Constitutional:      General: He is not in acute distress. HENT:     Head: Normocephalic and atraumatic.  Eyes:     Conjunctiva/sclera: Conjunctivae normal.     Pupils: Pupils are equal, round, and reactive to light.  Cardiovascular:     Rate and Rhythm: Normal rate and regular rhythm.  Pulmonary:     Effort: Pulmonary effort is normal. No respiratory distress.  Abdominal:     General: There is no distension.     Tenderness: There is no abdominal tenderness. There is no guarding.     Comments: Abdomen soft, nontender, nondistended, no rebound or guarding  Genitourinary:    Comments: Foley catheter in place (placed on arrival) Musculoskeletal:        General: No deformity or signs of injury.     Cervical back: Neck supple.  Skin:    Findings: No lesion or rash.  Neurological:     General: No focal deficit present.     Mental Status: He is alert. Mental status is at baseline.     ED Results / Procedures / Treatments   Labs (all labs ordered are listed, but only abnormal results are displayed) Labs Reviewed  [place],  ROUTINE W REFLEX MICROSCOPIC    EKG None  Radiology No results found.  Procedures Procedures    Medications Ordered in ED Medications - No data to display  ED Course/ Medical Decision Making/ A&P                           Medical Decision Making Amount and/or Complexity of Data Reviewed Labs: ordered.   61 year old male with medical history significant for BPH and urinary retention who presents to the emergency department with inability to urinate.  He states that he was seen in clinic at [facility] urology yesterday and had a Foley catheter removed.  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  He states that since 0 300 this morning, he has been completely unable to empty his bladder.  He endorsed  suprapubic pain and discomfort.  On arrival, the patient had a bladder scan significant for 441 cc in the bladder and a Foley catheter was subsequently replaced.  He denies any dysuria, fevers or chills.  He denies any other complaints.  He feels symptomatically improved following Foley catheter placement.  Arrival, the patient was vitally stable.  Presenting with concern for acute urinary retention after Foley catheter removal yesterday.  Has [facility] follow-up in clinic on Wednesday. Ordered a BMP to assess the patient's renal function in the setting of acute urinary retention, however the patient declined lab work, stating he will follow-up with his urologist.  Patient overall stable following reinsertion of a Foley catheter.  Advised to keep in place until follow-up with urology.  Well-appearing and symptomatically improved.  Stable on reassessment for discharge with close follow-up with urology in clinic.   Final Clinical Impression(s) / ED Diagnoses Final diagnoses:  Acute urinary retention    Rx / DC Orders ED Discharge Orders     None         Regan Lemming, MD 07/25/22 1012

## 2022-07-25 NOTE — ED Notes (Signed)
Dc instructions reviewed with partient. Patient voiced understanding. Dc with belongings. Reviewed foley /leg bag care and patients wife/husband voiced understanding

## 2022-07-25 NOTE — ED Triage Notes (Signed)
Pt states that he is having a flare up of his BPH. Pt states he recently had a catheter placed, and removed yesterday at [facility]. Pt states that he has been unable to empty his bladder since 0300.

## 2022-07-25 NOTE — ED Notes (Signed)
Foley catheter emptied 650 cc clear yellow urine.

## 2022-07-25 NOTE — ED Notes (Signed)
Pt declines blood work

## 2022-07-25 NOTE — Discharge Instructions (Addendum)
Please follow-up with [facility] in clinic next Wednesday per your regular scheduled appointment.  Keep your Foley catheter in place until then.

## 2022-08-18 ENCOUNTER — Other Ambulatory Visit: Payer: Self-pay | Admitting: Urology

## 2022-09-17 NOTE — Patient Instructions (Addendum)
SURGICAL WAITING ROOM VISITATION Patients having surgery or a procedure may have no more than 2 support people in the waiting area - these visitors may rotate.   Children under the age of 66 must have an adult with them who is not the patient. If the patient needs to stay at the hospital during part of their recovery, the visitor guidelines for inpatient rooms apply. Pre-op nurse will coordinate an appropriate time for 1 support person to accompany patient in pre-op.  This support person may not rotate.    Please refer to the [facility] website for the visitor guidelines for Inpatients (after your surgery is over and you are in a regular room).      Your procedure is scheduled on: 10-07-22   Report to [facility] Main Entrance    Report to admitting at 9:45 AM   Call this number if you have problems the morning of surgery [phone]   Follow a clear liquid diet the day before surgery   Do not eat food or drink liquids :After Midnight.           If you have questions, please contact your surgeon's office.   FOLLOW BOWEL PREP AND ANY ADDITIONAL PRE OP INSTRUCTIONS YOU RECEIVED FROM YOUR SURGEON'S OFFICE!!!  - Magnesium citrate - Drink one bottle by noon the day before surgery.      Oral Hygiene is also important to reduce your risk of infection.                                    Remember - BRUSH YOUR TEETH THE MORNING OF SURGERY WITH YOUR REGULAR TOOTHPASTE   Do NOT smoke after Midnight   Take these medicines the morning of surgery with A SIP OF WATER: Finasteride Bactrim  Hold Semaglutide 7 days before surgery (Do not take after 09/29/22)  Bring CPAP mask and tubing day of surgery.                              You may not have any metal on your body including, jewelry, and body piercing             Do not wear  lotions, powders, cologne, or deodorant              Men may shave face and neck.   Do not bring valuables to the hospital. [place].   Contacts, dentures or bridgework may not be worn into surgery.   Bring small overnight bag day of surgery.   DO NOT [place]. PHARMACY WILL DISPENSE MEDICATIONS LISTED ON YOUR MEDICATION LIST TO YOU DURING YOUR ADMISSION [place]!   Please read over the following fact sheets you were given: IF [place]  If you received a COVID test during your pre-op visit  it is requested that you wear a mask when out in public, stay away from anyone that may not be feeling well and notify your surgeon if you develop symptoms. If you test positive for Covid or have been in contact with anyone that has tested positive in the last 10 days please notify you surgeon.  [facility] - Preparing for Surgery Before surgery, you can play an important role.  Because  skin is not sterile, your skin needs to be as free of germs as possible.  You can reduce the number of germs on your skin by washing with CHG (chlorahexidine gluconate) soap before surgery.  CHG is an antiseptic cleaner which kills germs and bonds with the skin to continue killing germs even after washing. Please DO NOT use if you have an allergy to CHG or antibacterial soaps.  If your skin becomes reddened/irritated stop using the CHG and inform your nurse when you arrive at Short Stay. Do not shave (including legs and underarms) for at least 48 hours prior to the first CHG shower.  You may shave your face/neck.  Please follow these instructions carefully:  1.  Shower with CHG Soap the night before surgery and the  morning of surgery.  2.  If you choose to wash your hair, wash your hair first as usual with your normal  shampoo.  3.  After you shampoo, rinse your hair and body thoroughly to remove the shampoo.                             4.  Use CHG as you would any other liquid soap.  You can apply chg directly to  the skin and wash.  Gently with a scrungie or clean washcloth.  5.  Apply the CHG Soap to your body ONLY FROM THE NECK DOWN.   Do   not use on face/ open                           Wound or open sores. Avoid contact with eyes, ears mouth and   genitals (private parts).                       Wash face,  Genitals (private parts) with your normal soap.             6.  Wash thoroughly, paying special attention to the area where your    surgery  will be performed.  7.  Thoroughly rinse your body with warm water from the neck down.  8.  DO NOT shower/wash with your normal soap after using and rinsing off the CHG Soap.                9.  Pat yourself dry with a clean towel.            10.  Wear clean pajamas.            11.  Place clean sheets on your bed the night of your first shower and do not  sleep with pets. Day of Surgery : Do not apply any lotions/deodorants the morning of surgery.  Please wear clean clothes to the hospital/surgery center.  FAILURE TO FOLLOW THESE INSTRUCTIONS MAY RESULT IN THE CANCELLATION OF YOUR SURGERY  PATIENT SIGNATURE_________________________________  NURSE SIGNATURE__________________________________  ________________________________________________________________________

## 2022-09-22 NOTE — Progress Notes (Signed)
COVID Vaccine Completed:  Yes  Date of COVID positive in last 90 days:  PCP - Agustina Caroli, MD Cardiologist -  [place], MD  Chest x-ray - 04-08-22 Epic EKG -  Stress Test -  ECHO - 2020 Epic Cardiac Cath -  Pacemaker/ICD device last checked: Spinal Cord Stimulator:  Bowel Prep -   Sleep Study - Yes, +sleep apnea CPAP -   Prediabetes Fasting Blood Sugar -  Checks Blood Sugar _____ times a day  Last dose of GLP1 agonist-  N/A GLP1 instructions:  N/A   Last dose of SGLT-2 inhibitors-  N/A SGLT-2 instructions: N/A   Blood Thinner Instructions: Aspirin Instructions: Last Dose:  Activity level:  Can go up a flight of stairs and perform activities of daily living without stopping and without symptoms of chest pain or shortness of breath.  Able to exercise without symptoms  Unable to go up a flight of stairs without symptoms of     Anesthesia review:  Hx of abnormal EKG  Patient denies shortness of breath, fever, cough and chest pain at PAT appointment  Patient verbalized understanding of instructions that were given to them at the PAT appointment. Patient was also instructed that they will need to review over the PAT instructions again at home before surgery.

## 2022-09-24 ENCOUNTER — Other Ambulatory Visit: Payer: Self-pay

## 2022-09-24 ENCOUNTER — Encounter (HOSPITAL_COMMUNITY)
Admission: RE | Admit: 2022-09-24 | Discharge: 2022-09-24 | Disposition: A | Discharge status: Home / Self Care | Payer: BC Managed Care – PPO | Source: Ambulatory Visit | Attending: Urology | Admitting: Urology

## 2022-09-24 ENCOUNTER — Encounter (HOSPITAL_COMMUNITY): Payer: Self-pay

## 2022-09-24 VITALS — BP 155/85 | HR 65 | Temp 98.3°F | Resp 20 | Ht 71.0 in | Wt 273.8 lb

## 2022-09-24 DIAGNOSIS — R7303 Prediabetes: Secondary | ICD-10-CM | POA: Insufficient documentation

## 2022-09-24 DIAGNOSIS — Z01818 Encounter for other preprocedural examination: Secondary | ICD-10-CM | POA: Insufficient documentation

## 2022-09-24 HISTORY — DX: Pneumonia, unspecified organism: J18.9

## 2022-09-24 HISTORY — DX: Prediabetes: R73.03

## 2022-09-24 LAB — BASIC METABOLIC PANEL
Anion gap: 5 (ref 5–15)
BUN: 11 mg/dL (ref 8–23)
CO2: 27 mmol/L (ref 22–32)
Calcium: 9.2 mg/dL (ref 8.9–10.3)
Chloride: 108 mmol/L (ref 98–111)
Creatinine, Ser: 0.84 mg/dL (ref 0.61–1.24)
GFR, Estimated: >60 mL/min (ref >60)
Glucose, Bld: 104 mg/dL — ABNORMAL HIGH (ref 70–99)
Potassium: 4.5 mmol/L (ref 3.5–5.1)
Sodium: 140 mmol/L (ref 135–145)

## 2022-09-24 LAB — CBC
HCT: 46.3 % (ref 39.0–52.0)
Hemoglobin: 14.3 g/dL (ref 13.0–17.0)
MCH: 27.2 pg (ref 26.0–34.0)
MCHC: 30.9 g/dL (ref 30.0–36.0)
MCV: 88 fL (ref 80.0–100.0)
Platelets: 288 10*3/uL (ref 150–400)
RBC: 5.26 MIL/uL (ref 4.22–5.81)
RDW: 15.7 % — ABNORMAL HIGH (ref 11.5–15.5)
WBC: 10.1 10*3/uL (ref 4.0–10.5)
nRBC: 0 % (ref 0.0–0.2)

## 2022-09-24 LAB — HEMOGLOBIN A1C
Hgb A1c MFr Bld: 5.6 % (ref 4.8–5.6)
Mean Plasma Glucose: 114.02 mg/dL

## 2022-09-24 LAB — GLUCOSE, CAPILLARY: Glucose-Capillary: 106 mg/dL — ABNORMAL HIGH (ref 70–99)

## 2022-10-07 ENCOUNTER — Encounter (HOSPITAL_COMMUNITY)
Admission: RE | Disposition: A | Discharge status: Home / Self Care | Payer: Self-pay | Source: Home / Self Care | Attending: Urology

## 2022-10-07 ENCOUNTER — Other Ambulatory Visit: Payer: Self-pay

## 2022-10-07 ENCOUNTER — Encounter (HOSPITAL_COMMUNITY): Payer: Self-pay | Admitting: Urology

## 2022-10-07 ENCOUNTER — Observation Stay (HOSPITAL_COMMUNITY)
Admission: RE | Admit: 2022-10-07 | Discharge: 2022-10-08 | Disposition: A | Discharge status: Home / Self Care | Payer: BC Managed Care – PPO | Attending: Urology | Admitting: Urology

## 2022-10-07 ENCOUNTER — Ambulatory Visit (HOSPITAL_COMMUNITY): Payer: BC Managed Care – PPO | Admitting: Certified Registered Nurse Anesthetist

## 2022-10-07 ENCOUNTER — Ambulatory Visit (HOSPITAL_COMMUNITY): Payer: BC Managed Care – PPO | Admitting: Physician Assistant

## 2022-10-07 DIAGNOSIS — F17210 Nicotine dependence, cigarettes, uncomplicated: Secondary | ICD-10-CM | POA: Diagnosis not present

## 2022-10-07 DIAGNOSIS — R338 Other retention of urine: Secondary | ICD-10-CM | POA: Diagnosis not present

## 2022-10-07 DIAGNOSIS — N138 Other obstructive and reflux uropathy: Secondary | ICD-10-CM | POA: Diagnosis not present

## 2022-10-07 DIAGNOSIS — R7303 Prediabetes: Secondary | ICD-10-CM

## 2022-10-07 DIAGNOSIS — N401 Enlarged prostate with lower urinary tract symptoms: Secondary | ICD-10-CM | POA: Diagnosis present

## 2022-10-07 HISTORY — PX: XI ROBOTIC ASSISTED SIMPLE PROSTATECTOMY: SHX6713

## 2022-10-07 HISTORY — DX: Failed or difficult intubation, initial encounter: T88.4XXA

## 2022-10-07 LAB — HEMOGLOBIN AND HEMATOCRIT, BLOOD
HCT: 44.6 % (ref 39.0–52.0)
Hemoglobin: 13.7 g/dL (ref 13.0–17.0)

## 2022-10-07 SURGERY — PROSTATECTOMY, SIMPLE, ROBOT-ASSISTED
Anesthesia: General | Site: Abdomen

## 2022-10-07 MED ORDER — SODIUM CHLORIDE (PF) 0.9 % IJ SOLN
INTRAMUSCULAR | Status: AC
  Filled 2022-10-07: qty 10

## 2022-10-07 MED ORDER — FENTANYL CITRATE PF 50 MCG/ML IJ SOSY
PREFILLED_SYRINGE | INTRAMUSCULAR | Status: AC
  Filled 2022-10-07: qty 2

## 2022-10-07 MED ORDER — LACTATED RINGERS IV SOLN: INTRAVENOUS | Status: DC | PRN

## 2022-10-07 MED ORDER — MIDAZOLAM HCL 2 MG/2ML IJ SOLN
INTRAMUSCULAR | Status: AC
  Filled 2022-10-07: qty 2

## 2022-10-07 MED ORDER — FENTANYL CITRATE (PF) 250 MCG/5ML IJ SOLN
INTRAMUSCULAR | Status: DC | PRN
  Administered 2022-10-07 (×2): 50 ug via INTRAVENOUS
  Administered 2022-10-07: 100 ug via INTRAVENOUS
  Administered 2022-10-07: 50 ug via INTRAVENOUS

## 2022-10-07 MED ORDER — ONDANSETRON HCL 4 MG/2ML IJ SOLN
INTRAMUSCULAR | Status: AC
  Filled 2022-10-07: qty 2

## 2022-10-07 MED ORDER — PROPOFOL 10 MG/ML IV BOLUS
INTRAVENOUS | Status: AC
  Filled 2022-10-07: qty 20

## 2022-10-07 MED ORDER — OXYCODONE HCL 5 MG/5ML PO SOLN: 5.0000 mg | Freq: Once | ORAL | Status: DC | PRN

## 2022-10-07 MED ORDER — CEFAZOLIN SODIUM-DEXTROSE 1-4 GM/50ML-% IV SOLN
1.0000 g | Freq: Three times a day (TID) | INTRAVENOUS | Status: AC
  Administered 2022-10-07 – 2022-10-08 (×2): 1 g via INTRAVENOUS
  Filled 2022-10-07 (×2): qty 50

## 2022-10-07 MED ORDER — CHLORHEXIDINE GLUCONATE 0.12 % MT SOLN
15.0000 mL | Freq: Once | OROMUCOSAL | Status: AC
  Administered 2022-10-07: 15 mL via OROMUCOSAL

## 2022-10-07 MED ORDER — ONDANSETRON HCL 4 MG/2ML IJ SOLN
INTRAMUSCULAR | Status: DC | PRN
  Administered 2022-10-07: 4 mg via INTRAVENOUS

## 2022-10-07 MED ORDER — SENNA 8.6 MG PO TABS
1.0000 | ORAL_TABLET | Freq: Two times a day (BID) | ORAL | Status: DC
  Administered 2022-10-07 – 2022-10-08 (×2): 8.6 mg via ORAL
  Filled 2022-10-07 (×2): qty 1

## 2022-10-07 MED ORDER — MIDAZOLAM HCL 2 MG/2ML IJ SOLN
INTRAMUSCULAR | Status: DC | PRN
  Administered 2022-10-07: 2 mg via INTRAVENOUS

## 2022-10-07 MED ORDER — ROCURONIUM BROMIDE 10 MG/ML (PF) SYRINGE
PREFILLED_SYRINGE | INTRAVENOUS | Status: AC
  Filled 2022-10-07: qty 10

## 2022-10-07 MED ORDER — LIDOCAINE HCL (PF) 2 % IJ SOLN
INTRAMUSCULAR | Status: AC
  Filled 2022-10-07: qty 5

## 2022-10-07 MED ORDER — CELECOXIB 200 MG PO CAPS
200.0000 mg | ORAL_CAPSULE | Freq: Once | ORAL | Status: AC
  Administered 2022-10-07: 200 mg via ORAL
  Filled 2022-10-07: qty 1

## 2022-10-07 MED ORDER — BUPIVACAINE LIPOSOME 1.3 % IJ SUSP
INTRAMUSCULAR | Status: AC
  Filled 2022-10-07: qty 20

## 2022-10-07 MED ORDER — LIDOCAINE HCL URETHRAL/MUCOSAL 2 % EX GEL
CUTANEOUS | Status: AC
  Filled 2022-10-07: qty 5

## 2022-10-07 MED ORDER — DEXAMETHASONE SODIUM PHOSPHATE 10 MG/ML IJ SOLN
INTRAMUSCULAR | Status: AC
  Filled 2022-10-07: qty 1

## 2022-10-07 MED ORDER — ORAL CARE MOUTH RINSE: 15.0000 mL | Freq: Once | OROMUCOSAL | Status: AC

## 2022-10-07 MED ORDER — SUGAMMADEX SODIUM 500 MG/5ML IV SOLN
INTRAVENOUS | Status: DC | PRN
  Administered 2022-10-07: 300 mg via INTRAVENOUS

## 2022-10-07 MED ORDER — OXYCODONE HCL 5 MG PO TABS: 5.0000 mg | ORAL_TABLET | Freq: Four times a day (QID) | ORAL | 0 refills | Status: DC | PRN

## 2022-10-07 MED ORDER — LACTATED RINGERS IR SOLN
Status: DC | PRN
  Administered 2022-10-07: 1000 mL

## 2022-10-07 MED ORDER — LACTATED RINGERS IV SOLN: INTRAVENOUS | Status: DC

## 2022-10-07 MED ORDER — OXYCODONE HCL 5 MG PO TABS: 5.0000 mg | ORAL_TABLET | Freq: Once | ORAL | Status: DC | PRN

## 2022-10-07 MED ORDER — SODIUM CHLORIDE (PF) 0.9 % IJ SOLN
INTRAMUSCULAR | Status: AC
  Filled 2022-10-07: qty 20

## 2022-10-07 MED ORDER — DEXAMETHASONE SODIUM PHOSPHATE 10 MG/ML IJ SOLN
INTRAMUSCULAR | Status: DC | PRN
  Administered 2022-10-07: 10 mg via INTRAVENOUS

## 2022-10-07 MED ORDER — PHENYLEPHRINE 80 MCG/ML (10ML) SYRINGE FOR IV PUSH (FOR BLOOD PRESSURE SUPPORT)
PREFILLED_SYRINGE | INTRAVENOUS | Status: AC
  Filled 2022-10-07: qty 10

## 2022-10-07 MED ORDER — FENTANYL CITRATE (PF) 250 MCG/5ML IJ SOLN
INTRAMUSCULAR | Status: AC
  Filled 2022-10-07: qty 5

## 2022-10-07 MED ORDER — CEFAZOLIN IN SODIUM CHLORIDE 3-0.9 GM/100ML-% IV SOLN
3.0000 g | INTRAVENOUS | Status: AC
  Administered 2022-10-07: 3 g via INTRAVENOUS
  Filled 2022-10-07: qty 100

## 2022-10-07 MED ORDER — OXYCODONE HCL 5 MG PO TABS
5.0000 mg | ORAL_TABLET | ORAL | Status: DC | PRN
  Administered 2022-10-07 – 2022-10-08 (×3): 5 mg via ORAL
  Filled 2022-10-07 (×3): qty 1

## 2022-10-07 MED ORDER — ONDANSETRON HCL 4 MG/2ML IJ SOLN: 4.0000 mg | INTRAMUSCULAR | Status: DC | PRN

## 2022-10-07 MED ORDER — SENNOSIDES-DOCUSATE SODIUM 8.6-50 MG PO TABS: 1.0000 | ORAL_TABLET | Freq: Two times a day (BID) | ORAL | 0 refills | Status: DC

## 2022-10-07 MED ORDER — MAGNESIUM CITRATE PO SOLN: 1.0000 | Freq: Once | ORAL | Status: DC

## 2022-10-07 MED ORDER — HYDROMORPHONE HCL 1 MG/ML IJ SOLN
INTRAMUSCULAR | Status: DC | PRN
  Administered 2022-10-07 (×5): .4 mg via INTRAVENOUS

## 2022-10-07 MED ORDER — SUGAMMADEX SODIUM 500 MG/5ML IV SOLN
INTRAVENOUS | Status: AC
  Filled 2022-10-07: qty 5

## 2022-10-07 MED ORDER — DOCUSATE SODIUM 100 MG PO CAPS
100.0000 mg | ORAL_CAPSULE | Freq: Two times a day (BID) | ORAL | Status: DC
  Administered 2022-10-07 – 2022-10-08 (×2): 100 mg via ORAL
  Filled 2022-10-07 (×2): qty 1

## 2022-10-07 MED ORDER — STERILE WATER FOR IRRIGATION IR SOLN
Status: DC | PRN
  Administered 2022-10-07: 1000 mL

## 2022-10-07 MED ORDER — BUPIVACAINE LIPOSOME 1.3 % IJ SUSP
INTRAMUSCULAR | Status: DC | PRN
  Administered 2022-10-07: 20 mL

## 2022-10-07 MED ORDER — SODIUM CHLORIDE 0.9 % IV BOLUS
1000.0000 mL | Freq: Once | INTRAVENOUS | Status: AC
  Administered 2022-10-07: 1000 mL via INTRAVENOUS

## 2022-10-07 MED ORDER — PHENYLEPHRINE 80 MCG/ML (10ML) SYRINGE FOR IV PUSH (FOR BLOOD PRESSURE SUPPORT)
PREFILLED_SYRINGE | INTRAVENOUS | Status: DC | PRN
  Administered 2022-10-07: 80 ug via INTRAVENOUS

## 2022-10-07 MED ORDER — SODIUM CHLORIDE (PF) 0.9 % IJ SOLN
INTRAMUSCULAR | Status: DC | PRN
  Administered 2022-10-07: 20 mL

## 2022-10-07 MED ORDER — HYDROMORPHONE HCL 2 MG/ML IJ SOLN
INTRAMUSCULAR | Status: AC
  Filled 2022-10-07: qty 1

## 2022-10-07 MED ORDER — LIDOCAINE 2% (20 MG/ML) 5 ML SYRINGE
INTRAMUSCULAR | Status: DC | PRN
  Administered 2022-10-07: 60 mg via INTRAVENOUS

## 2022-10-07 MED ORDER — ACETAMINOPHEN 500 MG PO TABS
1000.0000 mg | ORAL_TABLET | Freq: Four times a day (QID) | ORAL | Status: DC
  Administered 2022-10-07 – 2022-10-08 (×3): 1000 mg via ORAL
  Filled 2022-10-07 (×5): qty 2

## 2022-10-07 MED ORDER — SULFAMETHOXAZOLE-TRIMETHOPRIM 800-160 MG PO TABS: 1.0000 | ORAL_TABLET | Freq: Two times a day (BID) | ORAL | 0 refills | Status: DC

## 2022-10-07 MED ORDER — PROPOFOL 10 MG/ML IV BOLUS
INTRAVENOUS | Status: DC | PRN
  Administered 2022-10-07: 200 mg via INTRAVENOUS

## 2022-10-07 MED ORDER — FENTANYL CITRATE PF 50 MCG/ML IJ SOSY
25.0000 ug | PREFILLED_SYRINGE | INTRAMUSCULAR | Status: DC | PRN
  Administered 2022-10-07 (×2): 50 ug via INTRAVENOUS

## 2022-10-07 MED ORDER — ROCURONIUM BROMIDE 10 MG/ML (PF) SYRINGE
PREFILLED_SYRINGE | INTRAVENOUS | Status: DC | PRN
  Administered 2022-10-07: 30 mg via INTRAVENOUS
  Administered 2022-10-07 (×3): 20 mg via INTRAVENOUS
  Administered 2022-10-07: 60 mg via INTRAVENOUS
  Administered 2022-10-07: 40 mg via INTRAVENOUS

## 2022-10-07 MED ORDER — PROMETHAZINE HCL 25 MG/ML IJ SOLN: 6.2500 mg | INTRAMUSCULAR | Status: DC | PRN

## 2022-10-07 MED ORDER — ACETAMINOPHEN 500 MG PO TABS
1000.0000 mg | ORAL_TABLET | Freq: Once | ORAL | Status: AC
  Administered 2022-10-07: 1000 mg via ORAL
  Filled 2022-10-07: qty 2

## 2022-10-07 SURGICAL SUPPLY — 71 items
ADH SKN CLS APL DERMABOND .7 (GAUZE/BANDAGES/DRESSINGS) ×3
APL PRP STRL LF DISP 70% ISPRP (MISCELLANEOUS) ×1
APL SWBSTK 6 STRL LF DISP (MISCELLANEOUS) ×1
APPLICATOR COTTON TIP 6 STRL (MISCELLANEOUS) ×1 IMPLANT
APPLICATOR COTTON TIP 6IN STRL (MISCELLANEOUS) ×1
BAG COUNTER SPONGE SURGICOUNT (BAG) IMPLANT
BAG SPNG CNTER NS LX DISP (BAG)
CATH FOLEY 2WAY SLVR 18FR 30CC (CATHETERS) ×1 IMPLANT
CATH FOLEY 3WAY 30CC 24FR (CATHETERS) ×2
CATH TIEMANN FOLEY 18FR 5CC (CATHETERS) IMPLANT
CATH URTH STD 24FR FL 3W 2 (CATHETERS) ×1 IMPLANT
CHLORAPREP W/TINT 26 (MISCELLANEOUS) ×1 IMPLANT
CLIP LIGATING HEM O LOK PURPLE (MISCELLANEOUS) IMPLANT
CLOTH BEACON ORANGE TIMEOUT ST (SAFETY) ×1 IMPLANT
COVER SURGICAL LIGHT HANDLE (MISCELLANEOUS) ×1 IMPLANT
COVER TIP SHEARS 8 DVNC (MISCELLANEOUS) ×1 IMPLANT
COVER TIP SHEARS 8MM DA VINCI (MISCELLANEOUS) ×1
CUTTER ECHEON FLEX ENDO 45 340 (ENDOMECHANICALS) IMPLANT
DERMABOND ADVANCED .7 DNX12 (GAUZE/BANDAGES/DRESSINGS) ×1 IMPLANT
DRAIN CHANNEL RND F F (WOUND CARE) IMPLANT
DRAPE ARM DVNC X/XI (DISPOSABLE) ×4 IMPLANT
DRAPE COLUMN DVNC XI (DISPOSABLE) ×1 IMPLANT
DRAPE DA VINCI XI ARM (DISPOSABLE) ×4
DRAPE DA VINCI XI COLUMN (DISPOSABLE) ×1
DRAPE SURG IRRIG POUCH 19X23 (DRAPES) ×1 IMPLANT
DRSG TEGADERM 4X4.75 (GAUZE/BANDAGES/DRESSINGS) ×1 IMPLANT
ELECT PENCIL ROCKER SW 15FT (MISCELLANEOUS) ×1 IMPLANT
ELECT REM PT RETURN 15FT ADLT (MISCELLANEOUS) ×1 IMPLANT
GAUZE SPONGE 4X4 12PLY STRL (GAUZE/BANDAGES/DRESSINGS) ×1 IMPLANT
GLOVE BIO SURGEON STRL SZ 6.5 (GLOVE) ×1 IMPLANT
GLOVE BIOGEL PI IND STRL 7.5 (GLOVE) ×1 IMPLANT
GLOVE SURG LX STRL 7.5 STRW (GLOVE) ×2 IMPLANT
GOWN SRG XL LVL 4 BRTHBL STRL (GOWNS) ×1 IMPLANT
GOWN STRL NON-REIN XL LVL4 (GOWNS) ×1
GOWN STRL REUS W/ TWL XL LVL3 (GOWN DISPOSABLE) ×2 IMPLANT
GOWN STRL REUS W/TWL XL LVL3 (GOWN DISPOSABLE) ×2
HOLDER FOLEY CATH W/STRAP (MISCELLANEOUS) ×1 IMPLANT
IRRIG SUCT STRYKERFLOW 2 WTIP (MISCELLANEOUS) ×1
IRRIGATION SUCT STRKRFLW 2 WTP (MISCELLANEOUS) ×1 IMPLANT
IV LACTATED RINGERS 1000ML (IV SOLUTION) ×1 IMPLANT
KIT TURNOVER KIT A (KITS) IMPLANT
NDL INSUFFLATION 14GA 120MM (NEEDLE) ×1 IMPLANT
NEEDLE INSUFFLATION 14GA 120MM (NEEDLE) ×1 IMPLANT
PACK ROBOT UROLOGY CUSTOM (CUSTOM PROCEDURE TRAY) ×1 IMPLANT
PAD POSITIONING PINK XL (MISCELLANEOUS) ×1 IMPLANT
PLUG CATH AND CAP STER (CATHETERS) IMPLANT
PORT ACCESS TROCAR AIRSEAL 12 (TROCAR) ×1 IMPLANT
RELOAD STAPLE 45 4.1 GRN THCK (STAPLE) IMPLANT
SEAL CANN UNIV 5-8 DVNC XI (MISCELLANEOUS) ×4 IMPLANT
SEAL XI 5MM-8MM UNIVERSAL (MISCELLANEOUS) ×4
SET TRI-LUMEN FLTR TB AIRSEAL (TUBING) ×1 IMPLANT
SOLUTION ELECTROLUBE (MISCELLANEOUS) ×1 IMPLANT
SPIKE FLUID TRANSFER (MISCELLANEOUS) ×1 IMPLANT
SPONGE T-LAP 4X18 ~~LOC~~+RFID (SPONGE) IMPLANT
STAPLE RELOAD 45 GRN (STAPLE) ×1 IMPLANT
STAPLE RELOAD 45MM GREEN (STAPLE) ×1
SUT ETHILON 3 0 PS 1 (SUTURE) ×1 IMPLANT
SUT MNCRL AB 4-0 PS2 18 (SUTURE) ×2 IMPLANT
SUT PDS AB 1 CT1 27 (SUTURE) ×2 IMPLANT
SUT VIC AB 0 CT1 27 (SUTURE) ×3
SUT VIC AB 0 CT1 27XBRD ANTBC (SUTURE) ×3 IMPLANT
SUT VIC AB 2-0 SH 27 (SUTURE) ×1
SUT VIC AB 2-0 SH 27X BRD (SUTURE) ×1 IMPLANT
SUT VICRYL 0 UR6 27IN ABS (SUTURE) ×1 IMPLANT
SUT VLOC 180 2-0 9IN GS21 (SUTURE) ×2 IMPLANT
SUT VLOC 3-0 9IN GRN (SUTURE) ×2 IMPLANT
SYS BAG RETRIEVAL 10MM (BASKET) ×1
SYSTEM BAG RETRIEVAL 10MM (BASKET) ×1 IMPLANT
TROCAR Z-THREAD FIOS 5X100MM (TROCAR) IMPLANT
TROCAR Z-THREAD OPTICAL 5X100M (TROCAR) IMPLANT
WATER STERILE IRR 1000ML POUR (IV SOLUTION) ×1 IMPLANT

## 2022-10-07 NOTE — Transfer of Care (Signed)
Immediate Anesthesia Transfer of Care Note  Patient: [name] [name]  Procedure(s) Performed: XI ROBOTIC ASSISTED SIMPLE PROSTATECTOMY (Abdomen)  Patient Location: PACU  Anesthesia Type:General  Level of Consciousness: awake  Airway & Oxygen Therapy: Patient Spontanous Breathing and Patient connected to face mask oxygen  Post-op Assessment: Report given to RN and Post -op Vital signs reviewed and stable  Post vital signs: Reviewed and stable  Last Vitals:  Vitals Value Taken Time  BP 124/81 10/07/22 1619  Temp    Pulse 75 10/07/22 1621  Resp 14 10/07/22 1621  SpO2 97 % 10/07/22 1621  Vitals shown include unvalidated device data.  Last Pain:  Vitals:   10/07/22 1010  TempSrc:   PainSc: 0-No pain         Complications:  Encounter Notable Events  Notable Event Outcome Phase Comment  Difficult to intubate - expected  Intraprocedure Filed from anesthesia note documentation.

## 2022-10-07 NOTE — H&P (Signed)
[name] [name] is an 61 y.o. male.    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 Pilar Plate retentino 2023 and failed voding trial x several. Urodynamics with preserved detrussor funciton with PDet 90 at 0 flow. Prior TRUS 154 gms with some median lobe.   PMH sig for obesity, pre-DM. NO ischemic CV disesae / blood thinners. 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 His wife [name] [name] is very involved. His PCP is Dr. Janett Labella.   Today " [name] [name] " is seen to proceed with simple prostatectomy. No interval fevers. He has been on bactrim pre-op according to most recent CX. Hgb 14, Cr <1.    Past Medical History:  Diagnosis Date   Arthritis    Back pain    BPH (benign prostatic hyperplasia)    Epididymitis    Hyperlipidemia    Joint pain    Morbid obesity ([facility])    OSA (obstructive sleep apnea)    Pneumonia    Pre-diabetes    Sleep apnea    Upper airway cough syndrome     Past Surgical History:  Procedure Laterality Date   CATARACT EXTRACTION W/ INTRAOCULAR LENS IMPLANT Bilateral    TESTICLE REMOVAL  age 79   WISDOM TOOTH EXTRACTION      Family History  Problem Relation Age of Onset   Coronary artery disease Mother 39   Diabetes Mother    Heart disease Mother    Hyperlipidemia Mother    Heart failure Mother    Diabetes Father    Heart disease Father    Diabetes Brother        older brother   Cancer Neg Hx    Social History:  reports that he has been smoking cigarettes. He has a 12.50 pack-year smoking history. He has never used smokeless tobacco. He reports current alcohol use of about 3.0 - 4.0 standard drinks of alcohol per week. He reports that he does not use drugs.  Allergies: No Known Allergies  No medications prior to admission.    No results found for this or any previous visit (from the past 48 hour(s)). No results found.  Review of  Systems  Constitutional:  Negative for chills and fever.  All other systems reviewed and are negative.   There were no vitals taken for this visit. Physical Exam Vitals reviewed.  HENT:     Head: Normocephalic.  Eyes:     Pupils: Pupils are equal, round, and reactive to light.  Cardiovascular:     Rate and Rhythm: Normal rate.  Pulmonary:     Effort: Pulmonary effort is normal.  Abdominal:     Comments: Stable truncal obesity  Genitourinary:    Comments: Catheter in place with non-foul urine.  Skin:    General: Skin is warm.  Neurological:     General: No focal deficit present.     Mental Status: He is alert.  Psychiatric:        Mood and Affect: Mood normal.      Assessment/Plan  Proceed as planned with simple prostatectomy with goal of eventual catheter free. Risks, benefits, alternatives, expected peri-op course discussed previously and reiterated today.   Alexis Frock, MD 10/07/2022, 6:49 AM

## 2022-10-07 NOTE — Brief Op Note (Signed)
10/07/2022  3:58 PM  PATIENT:  Juancarlos U Otero  61 y.o. male  PRE-OPERATIVE DIAGNOSIS:  RETENTION, ENLARGED PROSTATE  POST-OPERATIVE DIAGNOSIS:  RETENTION, ENLARGED PROSTATE  PROCEDURE:  Procedure(s) with comments: XI ROBOTIC ASSISTED SIMPLE PROSTATECTOMY (N/A) - 3 HRS  SURGEON:  Surgeon(s) and Role:    Alexis Frock, MD - Primary  PHYSICIAN ASSISTANT:   ASSISTANTS: none   ANESTHESIA:   general  EBL:  150 mL   BLOOD ADMINISTERED:none  DRAINS:  31F 3 way foley to gravity (irrigation port plugged); JP to bulb    LOCAL MEDICATIONS USED:  MARCAINE     SPECIMEN:  Source of Specimen:  prostate adenoma  DISPOSITION OF SPECIMEN:  PATHOLOGY  COUNTS:  YES  TOURNIQUET:  * No tourniquets in log *  DICTATION: .Other Dictation: Dictation Number [id number]  PLAN OF CARE: Admit for overnight observation  PATIENT DISPOSITION:  PACU - hemodynamically stable.   Delay start of Pharmacological VTE agent (>24hrs) due to surgical blood loss or risk of bleeding: yes

## 2022-10-07 NOTE — Anesthesia Procedure Notes (Addendum)
Procedure Name: Intubation Date/Time: 10/07/2022 12:53 PM  Performed by: Sharlette Dense, CRNAPatient Re-evaluated:Patient Re-evaluated prior to induction Oxygen Delivery Method: Circle system utilized Preoxygenation: Pre-oxygenation with 100% oxygen Induction Type: IV induction Ventilation: Two handed mask ventilation required and Oral airway inserted - appropriate to patient size Laryngoscope Size: Glidescope and 4 Grade View: Grade II Tube type: Oral Tube size: 8.0 mm Number of attempts: 2 Airway Equipment and Method: Video-laryngoscopy and Rigid stylet Placement Confirmation: ETT inserted through vocal cords under direct vision, positive ETCO2 and breath sounds checked- equal and bilateral Secured at: 23 cm Tube secured with: Tape Dental Injury: Teeth and Oropharynx as per pre-operative assessment  Difficulty Due To: Difficulty was anticipated, Difficult Airway- due to large tongue, Difficult Airway- due to reduced neck mobility, Difficult Airway- due to anterior larynx and Difficult Airway- due to limited oral opening Comments: Attempted DL with Miller 3.  Grade 4 view.  Proceeded immediately to Glidescope with successful intubation

## 2022-10-07 NOTE — Anesthesia Preprocedure Evaluation (Addendum)
Anesthesia Evaluation  Patient identified by MRN, date of birth, ID band Patient awake    Reviewed: Allergy & Precautions, NPO status , Patient's Chart, lab work & pertinent test results  History of Anesthesia Complications Negative for: history of anesthetic complications  Airway Mallampati: III  TM Distance: >3 FB Neck ROM: Full    Dental  (+) Dental Advisory Given, Teeth Intact   Pulmonary sleep apnea and Continuous Positive Airway Pressure Ventilation , Current Smoker and Patient abstained from smoking.   Pulmonary exam normal        Cardiovascular negative cardio ROS Normal cardiovascular exam     Neuro/Psych negative neurological ROS  negative psych ROS   GI/Hepatic negative GI ROS, Neg liver ROS,,,  Endo/Other    Morbid obesity Pre-DM   Renal/GU negative Renal ROS     Musculoskeletal  (+) Arthritis ,    Abdominal   Peds  Hematology negative hematology ROS (+)   Anesthesia Other Findings On GLP-1 agonist   Reproductive/Obstetrics                             Anesthesia Physical Anesthesia Plan  ASA: 3  Anesthesia Plan: General   Post-op Pain Management: Tylenol PO (pre-op)* and Celebrex PO (pre-op)*   Induction: Intravenous  PONV Risk Score and Plan: 2 and Treatment may vary due to age or medical condition, Ondansetron, Dexamethasone and Midazolam  Airway Management Planned: Oral ETT  Additional Equipment: None  Intra-op Plan:   Post-operative Plan: Extubation in OR  Informed Consent: I have reviewed the patients History and Physical, chart, labs and discussed the procedure including the risks, benefits and alternatives for the proposed anesthesia with the patient or authorized representative who has indicated his/her understanding and acceptance.     Dental advisory given  Plan Discussed with: CRNA and Anesthesiologist  Anesthesia Plan Comments:         Anesthesia Quick Evaluation

## 2022-10-07 NOTE — Discharge Instructions (Signed)

## 2022-10-07 NOTE — Op Note (Signed)
NAME: [name] [name], [name] [name] MEDICAL RECORD NO: [id number] ACCOUNT NO: [id number] DATE OF BIRTH: September 18, 1961 FACILITY: [facility] LOCATION: [id number] PHYSICIAN: Alexis Frock, MD  Operative Report   DATE OF PROCEDURE: 10/07/2022  PREOPERATIVE DIAGNOSIS:  Large prostatic hypertrophy with refractory urinary retention.  POSTOPERATIVE DIAGNOSIS:  Large prostatic hypertrophy with refractory urinary retention.  PROCEDURE:  Robotic-assisted laparoscopic simple prostatectomy.  ESTIMATED BLOOD LOSS:  150 mL.  COMPLICATIONS:  None.  SPECIMEN:  Prostate adenoma for permanent pathology.  FINDINGS:  1.  Large volume visceral fat. 2.  Trilobar prostatic hypertrophy, median lobe, incising approximately 1/3-1/2 of the prostate total volume. 3.  Wide open prostatic fossa from the bladder neck to the membranous urethra, following simple prostatectomy.  DRAINS:  1.  Jackson-Pratt drain to bulb suction. 2.  Foley catheter to straight drain  3-way type, irrigation port plugged.  INDICATIONS:  The patient is a 61 year old man with longstanding history of prostatic hypertrophy.  He has been on maximal medical therapy with alpha blockers and 5 alpha reductase inhibitors for some time.  He unfortunately developed a frank retention  earlier this year that has been refractory to repeat trials of void medical therapy.  He underwent urodynamic study, which revealed intact bladder function with preserved contractility, but no flow.  Cystoscopy revealed no strictures.  Axial imaging  corroborated trilobar prostatic hypertrophy with volume approximately 150 grams.  He was referred for consideration of simple prostatectomy given his gland size.  He was evaluated and it was clearly felt that this would be the optimal plan to a goal of  catheter free.  Informed consent was obtained and placed in medical record.  PROCEDURE IN DETAIL:  The patient being [name] [name] was verified and procedure being simple prostatectomy was  confirmed.  Procedure timeout was performed.  Intravenous antibiotics were administered.  General endotracheal anesthesia induced.  The  patient was placed into a low lithotomy position.  Sterile field was created, prepped and draped the patient's penis, perineum, and proximal thighs and his infra-xiphoid abdomen using chlorhexidine gluconate after clipper shaving and after he was further  fastened to the operating table using 3-inch tape with foam padding across the supraxiphoid chest.  A test of steep Trendelenburg positioning was performed and he was found to be suitably positioned.  His in situ Foley catheter had previously been  removed and new one was placed.  A high flow, low pressure, pneumoperitoneum was obtained using Veress technique in the supraumbilical midline having passed the aspiration drop test.  An 8 mm robotic camera port was then placed in the location.   Laparoscopic examination of peritoneal cavity revealed no significant adhesions, no visceral injury.  There were some scant adhesions in the area of the cecum, likely consistent with remote appendicitis.  No active evidence of infection.  He did have a  significant amount, of mostly male pattern visceral fat with very very thick short mesenteric loops that extended all the way to the deep pelvis even with Trendelenburg positioning.  Additional ports were placed as follows:  Right paramedian 8 mm robotic  port, right far lateral 12 mm AirSeal assist port, left paramedian 8 mm robotic port, left far lateral 8 mm robotic port, right paramedian 5 mm suction port.  Robot was docked and passed the electronic checks.  Initial attention was directed at  development of space of Retzius.  Incision was made lateral to the right median umbilical ligament from the midline towards the area of the internal ring to the area of the  right vas deferens, which was positively identified.  Mirror image dissection was  performed in the left side.  Anterior  attachments were taken down with cautery scissors.  This exposed the anterior base of the prostate, which was defatted to better denote the bladder neck prostate junction.  The endopelvic fascia was then swept away  from the apical area of the prostate just enough to expose the dorsal venous complex and it was controlled using green load stapler, which resulted in excellent hemostatic control of the DVC.  No membranous urethral injury occurred.  Next, an inverted U  cystotomy was made approximately 2.5 cm proximal to the area of the true bladder neck.  This was performed for approximately 50% of the circumference of the bladder and allowed acceptable visualization of the large prostate adenoma and the median lobe  was quite substantial, visually comprising approximately 1/3-1/2 of the total gland.  Figure of eight 0 Vicryl stay sutures were applied in the right lobe, left lobe and median lobe x 2 to allow for adenoma retraction.  Given the significant amount of  visceral adiposity, retraction of the bladder and inspection of the deep pelvis was quite difficult and additional 5 mm left paramedian assistant port site was placed to allow for other means to retract the bladder, superiorly and posteriorly, that  resulted in a much better visualization of the entire adenoma as well as the trigone ridge.  Bilateral ureteral orifices were positively identified.  Next, the posterior adenoma plane was entered approximately 2.5 cm distal to the area of the trigone  ridge, leaving a mucosal flap in place and the adenoma plane was carried out posteriorly all the way to the area of the apex of the prostate as denoted by anterior curvature.  This was then swept laterally in the adenoma plane to the level of the 3  o'clock and 9 o'clock positions respectively.  Next prostatic mucosal incision was performed in this area to the 12 o'clock position bilaterally keeping a thick capsular curtain and the adenoma plane was developed  from the 3 o'clock to 12 o'clock  position and the 9 o'clock to 12 o'clock positions respectively on the lateral planes using a lateral stay sutures for differential traction.  This then freed up vast majority of the adenoma except for the apical aspect.  Apical dissection was performed  by performing a butterfly type incision on the anterior commissure of the prostate.  This allowed much better visualization of the true apex, which was then released from the area of the membranous urethra.  This completely freed up the large prostate  adenoma, it was placed into EndoCatch bag for later retrieval.  Point coagulation current was used for several small bleeders in the prostatic fossa, which resulted in acceptable hemostasis.  Next, mucosal advancement flap was performed using a double  armed 3-0 V-Loc suture from the 6 o'clock to the 3 o'clock and 9 o'clock positions respectively, bringing the bladder neck mucosa flap inferior to the trigone and mucosal apposition to the posterior aspect of the membranous urethra.  This resulted in  excellent tension-free apposition and a lateral Foley catheter to easily traverse this mucosal bridge to the level of the urinary bladder without undermining.  This also resulted in excellent hemostasis of the prostatic fossa.  Ureteral orifices remained  visibly patent.  Next, the small inverted U cystotomy was then closed using two separate running suture lines of 2-0 V-Loc meeting at 12 o'clock position, tied to each other.  A  new 24-French 3-way Foley catheter was placed and the urinary bladder was  irrigated quantitatively.  No evidence of obvious leak or significant clots.  Sponge and needle counts were correct.  Hemostasis was quite good.  We achieved the goals of the extirpative portion of the procedure today.  A closed suction drain was brought  through the previous left lateral most robotic port site into the area of the peritoneal cavity.  The previous right lateral most  assistant port site was closed over the fascia using Carter-Thompson suture passer and 0 Vicryl.  Robot was undocked.   Specimen was retrieved by extending the previous camera port site inferiorly for a total distance of approximately 4 cm, removing the adenoma specimen and setting it aside for permanent pathology.  Extraction site was closed at the level of the fascia  using figure-of-eight PDS x 4 followed by reapproximation of Scarpa's with running Vicryl.  All incision sites were infiltrated with dilute lipolyzed Marcaine and closed at the level of the skin using subcuticular Monocryl, followed by Dermabond.   Procedure was then terminated.  The patient tolerated procedure well, no immediate perioperative complications.  The patient was taken to the postanesthesia care unit in stable condition.  Plan for observation admission.   [facility] D: 10/07/2022 4:08:44 pm T: 10/07/2022 10:06:00 pm  JOB: [id number]/ [id number]

## 2022-10-08 ENCOUNTER — Encounter (HOSPITAL_COMMUNITY): Payer: Self-pay | Admitting: Urology

## 2022-10-08 DIAGNOSIS — N401 Enlarged prostate with lower urinary tract symptoms: Secondary | ICD-10-CM | POA: Diagnosis not present

## 2022-10-08 LAB — BASIC METABOLIC PANEL
Anion gap: 7 (ref 5–15)
BUN: 17 mg/dL (ref 8–23)
CO2: 24 mmol/L (ref 22–32)
Calcium: 9.1 mg/dL (ref 8.9–10.3)
Chloride: 105 mmol/L (ref 98–111)
Creatinine, Ser: 1.02 mg/dL (ref 0.61–1.24)
GFR, Estimated: >60 mL/min (ref >60)
Glucose, Bld: 125 mg/dL — ABNORMAL HIGH (ref 70–99)
Potassium: 4.9 mmol/L (ref 3.5–5.1)
Sodium: 136 mmol/L (ref 135–145)

## 2022-10-08 LAB — HEMOGLOBIN AND HEMATOCRIT, BLOOD
HCT: 42.5 % (ref 39.0–52.0)
Hemoglobin: 13.4 g/dL (ref 13.0–17.0)

## 2022-10-08 NOTE — Anesthesia Postprocedure Evaluation (Signed)
Anesthesia Post Note  Patient: [name] [name]  Procedure(s) Performed: XI ROBOTIC ASSISTED SIMPLE PROSTATECTOMY (Abdomen)     Patient location during evaluation: PACU Anesthesia Type: General Level of consciousness: awake and alert Pain management: pain level controlled Vital Signs Assessment: post-procedure vital signs reviewed and stable Respiratory status: spontaneous breathing, nonlabored ventilation, respiratory function stable and patient connected to nasal cannula oxygen Cardiovascular status: blood pressure returned to baseline and stable Postop Assessment: no apparent nausea or vomiting Anesthetic complications: yes   Encounter Notable Events  Notable Event Outcome Phase Comment  Difficult to intubate - expected  Intraprocedure Filed from anesthesia note documentation.    Last Vitals:  Vitals:   10/08/22 0115 10/08/22 0456  BP: (!) 142/74 125/66  Pulse: 70 65  Resp: 18 16  Temp: 36.5 C 36.7 C  SpO2: 96% 96%    Last Pain:  Vitals:   10/08/22 0456  TempSrc: Oral  PainSc:                  Audry Pili

## 2022-10-08 NOTE — Progress Notes (Signed)
Pt ambulated from Room to nurse station last night and this morning via walker. Tolerated well.

## 2022-10-08 NOTE — Discharge Summary (Signed)
Date of admission: 10/07/2022  Date of discharge: 10/08/2022  Admission diagnosis: BPH with urinary obstruction  Discharge diagnosis: BPH with urinary obstruction  History and Physical: For full details, please see admission history and physical. Briefly, [name] [name] is a 61 y.o. gentleman with BPH c/b urinary obstruction refractory to medical therapy.  After discussing management/treatment options, he elected to proceed with surgical treatment.  Hospital Course: Teshaun U Traeger was taken to the operating room on 10/07/2022 and underwent a robotic assisted laparoscopic simple prostatectomy. He tolerated this procedure well and without complications. Postoperatively, he was able to be transferred to a regular hospital room following recovery from anesthesia.  He was able to begin ambulating the night of surgery. He remained hemodynamically stable overnight.  He had excellent urine output with appropriately minimal output from his pelvic drain and his pelvic drain was removed on POD #1.  He was transitioned to oral pain medication, tolerated a clear liquid diet, and had met all discharge criteria and was able to be discharged home later on POD#1.  Laboratory values:  Recent Labs    10/07/22 1642 10/08/22 0453  HGB 13.7 13.4  HCT 44.6 42.5    Disposition: Home  Discharge instruction: He was instructed to be ambulatory but to refrain from heavy lifting, strenuous activity, or driving. He was instructed on urethral catheter care.  Discharge medications:     Followup: He will followup in 1 week for catheter removal and to discuss his surgical pathology results.   

## 2022-10-09 LAB — SURGICAL PATHOLOGY

## 2022-12-21 ENCOUNTER — Ambulatory Visit: Payer: BC Managed Care – PPO | Admitting: Family Medicine

## 2022-12-22 ENCOUNTER — Ambulatory Visit: Payer: BC Managed Care – PPO | Admitting: Emergency Medicine

## 2022-12-22 ENCOUNTER — Encounter: Payer: Self-pay | Admitting: Emergency Medicine

## 2022-12-22 VITALS — BP 134/86 | HR 87 | Temp 98.2°F | Ht 71.0 in | Wt 276.4 lb

## 2022-12-22 DIAGNOSIS — J22 Unspecified acute lower respiratory infection: Secondary | ICD-10-CM | POA: Insufficient documentation

## 2022-12-22 DIAGNOSIS — Z1211 Encounter for screening for malignant neoplasm of colon: Secondary | ICD-10-CM | POA: Diagnosis not present

## 2022-12-22 DIAGNOSIS — R051 Acute cough: Secondary | ICD-10-CM

## 2022-12-22 MED ORDER — AZITHROMYCIN 250 MG PO TABS: ORAL_TABLET | ORAL | 0 refills | Status: DC

## 2022-12-22 MED ORDER — BENZONATATE 200 MG PO CAPS: 200.0000 mg | ORAL_CAPSULE | Freq: Two times a day (BID) | ORAL | 0 refills | Status: DC | PRN

## 2022-12-22 MED ORDER — HYDROCODONE BIT-HOMATROP MBR 5-1.5 MG/5ML PO SOLN: 5.0000 mL | Freq: Every evening | ORAL | 0 refills | Status: DC | PRN

## 2022-12-22 MED ORDER — WEGOVY 1 MG/0.5ML ~~LOC~~ SOAJ: 1.0000 mg | SUBCUTANEOUS | 7 refills | Status: DC

## 2022-12-22 NOTE — Progress Notes (Signed)
[name] [name] 62 y.o.   Chief Complaint  Patient presents with   Acute Visit    URI symptoms on last Friday, patient states he is experiencing nasal congestion, chest congestion, cough    HISTORY OF PRESENT ILLNESS: This is a 62 y.o. male complaining of flulike symptoms that started 5 days ago and progressively getting worse Mostly complaining of productive cough and chest congestion Also wants to get back on Wegovy for weight loss No other complaints or medical concerns today.  HPI   Prior to Admission medications   Medication Sig Start Date End Date Taking? Authorizing Provider  finasteride (PROSCAR) 5 MG tablet Take 5 mg by mouth every morning.   Yes [provider]  senna-docusate (SENOKOT-S) 8.6-50 MG tablet Take 1 tablet by mouth 2 (two) times daily. While taking strong pain meds to prevent constipation 10/07/22  Yes Alexis Frock, MD  Semaglutide-Weight Management 1.7 MG/0.75ML SOAJ Inject 1.7 mg into the skin once a week. Patient not taking: Reported on 12/22/2022 05/28/22   Horald Pollen, MD    No Known Allergies  Patient Active Problem List   Diagnosis Date Noted   BPH with obstruction/lower urinary tract symptoms 10/07/2022   Prediabetes 03/31/2022   Chronic pain of right knee 03/31/2022   Other hyperlipidemia 04/01/2021   Vitamin D deficiency 04/01/2021   Tobacco abuse 12/07/2018   Multinodular goiter 09/21/2012   Cigarette smoker 08/19/2010   BENIGN PROSTATIC HYPERTROPHY, HX OF 08/19/2010   HYPERCHOLESTEROLEMIA 04/25/2009   Morbid obesity ([place]) 09/06/2007   OBSTRUCTIVE SLEEP APNEA 09/06/2007    Past Medical History:  Diagnosis Date   Arthritis    Back pain    BPH (benign prostatic hyperplasia)    Difficult intubation    Epididymitis    Hyperlipidemia    Joint pain    Morbid obesity ([facility])    OSA (obstructive sleep apnea)    Pneumonia    Pre-diabetes    Sleep apnea    Upper airway cough syndrome     Past Surgical History:   Procedure Laterality Date   CATARACT EXTRACTION W/ INTRAOCULAR LENS IMPLANT Bilateral    TESTICLE REMOVAL  age 49   WISDOM TOOTH EXTRACTION     XI ROBOTIC ASSISTED SIMPLE PROSTATECTOMY N/A 10/07/2022   Procedure: XI ROBOTIC ASSISTED SIMPLE PROSTATECTOMY;  Surgeon: Alexis Frock, MD;  Location: [facility];  Service: Urology;  Laterality: N/A;  3 HRS    Social History   Socioeconomic History   Marital status: Married    Spouse name: Not on file   Number of children: Not on file   Years of education: Not on file   Highest education level: Not on file  Occupational History   Occupation: Statistician: [employer]  Tobacco Use   Smoking status: Every Day    Packs/day: 0.50    Years: 25.00    Total pack years: 12.50    Types: Cigarettes   Smokeless tobacco: Never   Tobacco comments:    1/2 pack per day 08/26/21  Vaping Use   Vaping Use: Never used  Substance and Sexual Activity   Alcohol use: Yes    Alcohol/week: 3.0 - 4.0 standard drinks of alcohol    Types: 3 - 4 Standard drinks or equivalent per week   Drug use: No   Sexual activity: Never  Other Topics Concern   Not on file  Social History Narrative   Not on file   Social Determinants of  Health   Financial Resource Strain: Not on file  Food Insecurity: No Food Insecurity (10/07/2022)   Hunger Vital Sign    Worried About Running Out of Food in the Last Year: Never true    Ran Out of Food in the Last Year: Never true  Transportation Needs: No Transportation Needs (10/07/2022)   PRAPARE - Hydrologist (Medical): No    Lack of Transportation (Non-Medical): No  Physical Activity: Not on file  Stress: Not on file  Social Connections: Not on file  Intimate Partner Violence: Not At Risk (10/07/2022)   Humiliation, Afraid, Rape, and Kick questionnaire    Fear of Current or Ex-Partner: No    Emotionally Abused: No    Physically Abused: No    Sexually Abused: No     Family History  Problem Relation Age of Onset   Coronary artery disease Mother 33   Diabetes Mother    Heart disease Mother    Hyperlipidemia Mother    Heart failure Mother    Diabetes Father    Heart disease Father    Diabetes Brother        older brother   Cancer Neg Hx      Review of Systems  Constitutional: Negative.  Negative for chills and fever.  HENT:  Positive for congestion and sore throat.   Respiratory:  Positive for cough and sputum production. Negative for shortness of breath.   Cardiovascular: Negative.  Negative for chest pain and palpitations.  Gastrointestinal:  Negative for abdominal pain, nausea and vomiting.  Genitourinary: Negative.  Negative for dysuria and hematuria.  Skin: Negative.  Negative for rash.  Neurological: Negative.  Negative for dizziness and headaches.  All other systems reviewed and are negative.  Today's Vitals   12/22/22 1314  BP: 134/86  Pulse: 87  Temp: 98.2 F (36.8 C)  TempSrc: Oral  SpO2: 95%  Weight: 276 lb 6 oz (125.4 kg)  Height: 5' 11"$  (1.803 m)   Body mass index is 38.55 kg/m.   Physical Exam Vitals reviewed.  Constitutional:      Appearance: Normal appearance. He is obese.  HENT:     Head: Normocephalic.     Right Ear: Tympanic membrane, ear canal and external ear normal.     Left Ear: Tympanic membrane, ear canal and external ear normal.     Nose: Congestion present.     Mouth/Throat:     Pharynx: Posterior oropharyngeal erythema present. No oropharyngeal exudate.  Eyes:     Extraocular Movements: Extraocular movements intact.     Conjunctiva/sclera: Conjunctivae normal.     Pupils: Pupils are equal, round, and reactive to light.  Cardiovascular:     Rate and Rhythm: Normal rate and regular rhythm.     Pulses: Normal pulses.     Heart sounds: Normal heart sounds.  Pulmonary:     Effort: Pulmonary effort is normal.     Breath sounds: Normal breath sounds.  Abdominal:     Palpations: Abdomen is  soft.     Tenderness: There is no abdominal tenderness.  Musculoskeletal:     Cervical back: No tenderness.  Lymphadenopathy:     Cervical: No cervical adenopathy.  Skin:    General: Skin is warm and dry.  Neurological:     General: No focal deficit present.     Mental Status: He is alert and oriented to person, place, and time.  Psychiatric:        Mood and Affect:  Mood normal.        Behavior: Behavior normal.      ASSESSMENT & PLAN: A total of 42 minutes was spent with the patient and counseling/coordination of care regarding preparing for this visit, review of most recent office visit notes, review of chronic medical conditions under management, review of all medications, education on nutrition and starting Wegovy weekly, diagnosis of lower respiratory infection and need for antibiotics, cough management, prognosis, documentation, and need for follow-up if no better or worse during the next several days  Problem List Items Addressed This Visit       Respiratory   Lower respiratory infection - Primary    Upper viral respiratory infection now with secondary bacterial infection. Will benefit from daily azithromycin for 5 days. Clinically stable.  No signs of pneumonia. Advised to rest and stay well-hydrated. Cough management discussed.      Relevant Medications   azithromycin (ZITHROMAX) 250 MG tablet     Other   Morbid obesity ([place])    Wants to get back on Wegovy 1.0 mg weekly. Diet and nutrition discussed. Advised to decrease amount of daily carbohydrate intake and daily calories and increase amount of plant-based protein in his diet      Relevant Medications   Semaglutide-Weight Management (WEGOVY) 1 MG/0.5ML SOAJ   Acute cough    Continue over-the-counter Mucinex DM and cough drops. Start Tessalon 200 mg 3 times a day and Hycodan syrup at bedtime as needed Cough management discussed. Advised to stay well-hydrated and rest.      Relevant Medications    benzonatate (TESSALON) 200 MG capsule   HYDROcodone bit-homatropine (HYCODAN) 5-1.5 MG/5ML syrup   Other Visit Diagnoses     Colon cancer screening       Relevant Orders   Cologuard        Patient Instructions  Cough, Adult A cough helps to clear your throat and lungs. A cough may be a sign of an illness or another medical condition. An acute cough may only last 2-3 weeks, while a chronic cough may last 8 or more weeks. Many things can cause a cough. They include: Germs (viruses or bacteria) that attack the airway. Breathing in things that bother (irritate) your lungs. Allergies. Asthma. Mucus that runs down the back of your throat (postnasal drip). Smoking. Acid backing up from the stomach into the tube that moves food from the mouth to the stomach (gastroesophageal reflux). Some medicines. Lung problems. Other medical conditions, such as heart failure or a blood clot in the lung (pulmonary embolism). Follow these instructions at home: Medicines Take over-the-counter and prescription medicines only as told by your doctor. Talk with your doctor before you take medicines that stop a cough (cough suppressants). Lifestyle  Do not smoke, and try not to be around smoke. Do not use any products that contain nicotine or tobacco, such as cigarettes, e-cigarettes, and chewing tobacco. If you need help quitting, ask your doctor. Drink enough fluid to keep your pee (urine) pale yellow. Avoid caffeine. Do not drink alcohol if your doctor tells you not to drink. General instructions  Watch for any changes in your cough. Tell your doctor about them. Always cover your mouth when you cough. Stay away from things that make you cough, such as perfume, candles, campfire smoke, or cleaning products. If the air is dry, use a cool mist vaporizer or humidifier in your home. If your cough is worse at night, try using extra pillows to raise your head up higher  while you sleep. Rest as  needed. Keep all follow-up visits as told by your doctor. This is important. Contact a doctor if: You have new symptoms. You cough up pus. Your cough does not get better after 2-3 weeks, or your cough gets worse. Cough medicine does not help your cough and you are not sleeping well. You have pain that gets worse or pain that is not helped with medicine. You have a fever. You are losing weight and you do not know why. You have night sweats. Get help right away if: You cough up blood. You have trouble breathing. Your heartbeat is very fast. These symptoms may be an emergency. Do not wait to see if the symptoms will go away. Get medical help right away. Call your local emergency services (911 in the [place] Do not drive yourself to the hospital. Summary A cough helps to clear your throat and lungs. Many things can cause a cough. Take over-the-counter and prescription medicines only as told by your doctor. Always cover your mouth when you cough. Contact a doctor if you have new symptoms or you have a cough that does not get better or gets worse. This information is not intended to replace advice given to you by your health care provider. Make sure you discuss any questions you have with your health care provider. Document Revised: 12/15/2019 Document Reviewed: 11/14/2018 Elsevier Patient Education  [place], MD [facility]

## 2022-12-22 NOTE — Assessment & Plan Note (Signed)
Upper viral respiratory infection now with secondary bacterial infection. Will benefit from daily azithromycin for 5 days. Clinically stable.  No signs of pneumonia. Advised to rest and stay well-hydrated. Cough management discussed.

## 2022-12-22 NOTE — Assessment & Plan Note (Signed)
Wants to get back on Wegovy 1.0 mg weekly. Diet and nutrition discussed. Advised to decrease amount of daily carbohydrate intake and daily calories and increase amount of plant-based protein in his diet

## 2022-12-22 NOTE — Assessment & Plan Note (Signed)
Continue over-the-counter Mucinex DM and cough drops. Start Tessalon 200 mg 3 times a day and Hycodan syrup at bedtime as needed Cough management discussed. Advised to stay well-hydrated and rest.

## 2022-12-22 NOTE — Patient Instructions (Signed)

## 2022-12-23 ENCOUNTER — Encounter: Payer: Self-pay | Admitting: *Deleted

## 2022-12-23 ENCOUNTER — Telehealth: Payer: Self-pay | Admitting: *Deleted

## 2022-12-23 NOTE — Telephone Encounter (Signed)
PA for [facility] denied, appeal submitted, awaiting response

## 2022-12-23 NOTE — Telephone Encounter (Signed)
PA for [facility] submitted, awaiting response

## 2023-01-07 NOTE — Telephone Encounter (Signed)
PA appeal [name] [name] was approved, 01/01/2023-02/07/2023

## 2023-02-11 ENCOUNTER — Ambulatory Visit: Payer: BC Managed Care – PPO | Admitting: Family Medicine

## 2023-02-11 ENCOUNTER — Encounter: Payer: Self-pay | Admitting: Family Medicine

## 2023-02-11 VITALS — BP 134/82 | HR 76 | Temp 97.6°F | Ht 71.0 in | Wt 278.0 lb

## 2023-02-11 DIAGNOSIS — J309 Allergic rhinitis, unspecified: Secondary | ICD-10-CM | POA: Diagnosis not present

## 2023-02-11 DIAGNOSIS — R051 Acute cough: Secondary | ICD-10-CM | POA: Diagnosis not present

## 2023-02-11 NOTE — Progress Notes (Signed)
Subjective:  [name] [name] is a 62 y.o. male who presents for a 7 day hx of fatigue,  nasal congestion, clear nasal drainage, itchy ears, cough-clear sputum, headache, post nasal drainage.   He has taken Zyrtec, Mucinex DM, and Tessalon.   Denies chills, body aches, chest pain, palpitations, shortness of breath, wheezing, abdominal pain, vomiting or diarrhea.    No other aggravating or relieving factors.  No other c/o.  ROS as in subjective.   Objective: Vitals:   02/11/23 1535  BP: 134/82  Pulse: 76  Temp: 97.6 F (36.4 C)  SpO2: 96%    General appearance: Alert, WD/WN, no distress, mildly ill appearing                             Skin: warm, no rash                           Head: no sinus tenderness                            Eyes: conjunctiva normal, corneas clear, PERRLA                            Ears: pearly TMs, external ear canals normal                          Nose: septum midline, turbinates swollen, with erythema and clear discharge             Mouth/throat: MMM, tongue normal, no pharyngeal erythema                           Neck: supple, no adenopathy, no thyromegaly, nontender                          Heart: RRR                          Lungs: CTA bilaterally, no wheezes, rales, or rhonchi      Assessment: Allergic rhinitis, unspecified seasonality, unspecified trigger  Acute cough   Plan: Discussed diagnosis and treatment allergic rhinitis and cough.  Suggested symptomatic OTC remedies.  Use Flonase and Zyrtec or Xyzal. Hydrate. Tessalon ok to take. Mucinex DM if needed. Elevate head at night. Call/return if symptoms aren't resolving or if worsening.

## 2023-02-11 NOTE — Patient Instructions (Signed)
You appear to have allergies.   Try Xyzal or Zyrtec by mouth daily.   Use a Flonase nasal spray.   Drink plenty of water.   Continue Mucinex. Tessalon ok to take also.   Follow up if you are having any new or worsening symptoms.

## 2023-04-02 ENCOUNTER — Encounter: Payer: Self-pay | Admitting: Internal Medicine

## 2023-04-09 ENCOUNTER — Telehealth: Payer: Self-pay

## 2023-04-09 NOTE — Telephone Encounter (Signed)
Dr. Marina Goodell,   This patient is marked difficult to intubate. I am going to cancel his PV and colon for [facility]. Please advise if you would like an OV or direct to the hospital. I have included your RN in this message so she is aware of your decision.   Thank you

## 2023-04-09 NOTE — Telephone Encounter (Signed)
Noted. Okay for routine full previsit then scheduling at the hospital

## 2023-04-09 NOTE — Telephone Encounter (Signed)
Bonita Quin, Please see attached. I called the patient to make him aware of the cancellation. I am not sure if he will get apt date prior to his PV on 6/6 so I have cancelled it. Please get him r/s for OV once he has apt date for the hospital. Thank you

## 2023-04-12 ENCOUNTER — Telehealth: Payer: Self-pay | Admitting: *Deleted

## 2023-04-12 NOTE — Telephone Encounter (Signed)
Team,   This pt is a documented difficult intubation and his procedure will need to be done at the hospital.   Thanks,  Cathlyn Parsons

## 2023-04-12 NOTE — Telephone Encounter (Signed)
Dr. Marianna Fuss,   Please see previous messages and advise if patient can be a direct at [facility] or does he need an OV prior to procedure?  Bonita Quin- Please forward back to Pre Visit if you can get the procedure scheduled prior to the scheduled PV appt, Thank you Bre

## 2023-04-13 ENCOUNTER — Other Ambulatory Visit: Payer: Self-pay

## 2023-04-13 ENCOUNTER — Ambulatory Visit (INDEPENDENT_AMBULATORY_CARE_PROVIDER_SITE_OTHER): Payer: BC Managed Care – PPO | Admitting: Emergency Medicine

## 2023-04-13 ENCOUNTER — Encounter: Payer: Self-pay | Admitting: Emergency Medicine

## 2023-04-13 VITALS — BP 118/68 | HR 84 | Temp 98.1°F | Ht 71.0 in | Wt 273.5 lb

## 2023-04-13 DIAGNOSIS — Z13228 Encounter for screening for other metabolic disorders: Secondary | ICD-10-CM

## 2023-04-13 DIAGNOSIS — Z130 Encounter for screening for diseases of the blood and blood-forming organs and certain disorders involving the immune mechanism: Secondary | ICD-10-CM | POA: Diagnosis not present

## 2023-04-13 DIAGNOSIS — N138 Other obstructive and reflux uropathy: Secondary | ICD-10-CM

## 2023-04-13 DIAGNOSIS — R7303 Prediabetes: Secondary | ICD-10-CM

## 2023-04-13 DIAGNOSIS — N401 Enlarged prostate with lower urinary tract symptoms: Secondary | ICD-10-CM

## 2023-04-13 DIAGNOSIS — Z13220 Encounter for screening for lipoid disorders: Secondary | ICD-10-CM | POA: Diagnosis not present

## 2023-04-13 DIAGNOSIS — F17210 Nicotine dependence, cigarettes, uncomplicated: Secondary | ICD-10-CM

## 2023-04-13 DIAGNOSIS — Z1329 Encounter for screening for other suspected endocrine disorder: Secondary | ICD-10-CM

## 2023-04-13 DIAGNOSIS — Z1211 Encounter for screening for malignant neoplasm of colon: Secondary | ICD-10-CM

## 2023-04-13 DIAGNOSIS — E7849 Other hyperlipidemia: Secondary | ICD-10-CM

## 2023-04-13 DIAGNOSIS — Z0000 Encounter for general adult medical examination without abnormal findings: Secondary | ICD-10-CM | POA: Diagnosis not present

## 2023-04-13 LAB — CBC WITH DIFFERENTIAL/PLATELET
Basophils Absolute: 0 10*3/uL (ref 0.0–0.1)
Basophils Relative: 0.4 % (ref 0.0–3.0)
Eosinophils Absolute: 0.2 10*3/uL (ref 0.0–0.7)
Eosinophils Relative: 2 % (ref 0.0–5.0)
HCT: 40.6 % (ref 39.0–52.0)
Hemoglobin: 13.3 g/dL (ref 13.0–17.0)
Lymphocytes Relative: 21.2 % (ref 12.0–46.0)
Lymphs Abs: 2.4 10*3/uL (ref 0.7–4.0)
MCHC: 32.7 g/dL (ref 30.0–36.0)
MCV: 84.9 fl (ref 78.0–100.0)
Monocytes Absolute: 0.9 10*3/uL (ref 0.1–1.0)
Monocytes Relative: 7.6 % (ref 3.0–12.0)
Neutro Abs: 7.8 10*3/uL — ABNORMAL HIGH (ref 1.4–7.7)
Neutrophils Relative %: 68.8 % (ref 43.0–77.0)
Platelets: 276 10*3/uL (ref 150.0–400.0)
RBC: 4.79 Mil/uL (ref 4.22–5.81)
RDW: 15.1 % (ref 11.5–15.5)
WBC: 11.4 10*3/uL — ABNORMAL HIGH (ref 4.0–10.5)

## 2023-04-13 LAB — LIPID PANEL
Cholesterol: 178 mg/dL (ref 0–200)
HDL: 35.3 mg/dL — ABNORMAL LOW (ref >39.00)
NonHDL: 142.57
Total CHOL/HDL Ratio: 5
Triglycerides: 267 mg/dL — ABNORMAL HIGH (ref 0.0–149.0)
VLDL: 53.4 mg/dL — ABNORMAL HIGH (ref 0.0–40.0)

## 2023-04-13 LAB — COMPREHENSIVE METABOLIC PANEL
ALT: 22 U/L (ref 0–53)
AST: 16 U/L (ref 0–37)
Albumin: 4 g/dL (ref 3.5–5.2)
Alkaline Phosphatase: 79 U/L (ref 39–117)
BUN: 14 mg/dL (ref 6–23)
CO2: 29 mEq/L (ref 19–32)
Calcium: 9.5 mg/dL (ref 8.4–10.5)
Chloride: 104 mEq/L (ref 96–112)
Creatinine, Ser: 0.85 mg/dL (ref 0.40–1.50)
GFR: 92.58 mL/min (ref >60.00)
Glucose, Bld: 96 mg/dL (ref 70–99)
Potassium: 4 mEq/L (ref 3.5–5.1)
Sodium: 141 mEq/L (ref 135–145)
Total Bilirubin: 0.3 mg/dL (ref 0.2–1.2)
Total Protein: 7.5 g/dL (ref 6.0–8.3)

## 2023-04-13 LAB — HEMOGLOBIN A1C: Hgb A1c MFr Bld: 6.4 % (ref 4.6–6.5)

## 2023-04-13 LAB — LDL CHOLESTEROL, DIRECT: Direct LDL: 125 mg/dL

## 2023-04-13 NOTE — Assessment & Plan Note (Signed)
Cardiovascular and cancer risks associated with smoking discussed. °Smoking cessation advice given. °

## 2023-04-13 NOTE — Telephone Encounter (Signed)
Thank you  This will be noted on his PV chart

## 2023-04-13 NOTE — Assessment & Plan Note (Signed)
Diet and nutrition discussed.  Lipid profile done today.  

## 2023-04-13 NOTE — Assessment & Plan Note (Signed)
Status post robotic assisted prostate surgery last November Follows up with urologist on a regular basis Still taking finasteride 5 mg daily

## 2023-04-13 NOTE — Assessment & Plan Note (Signed)
Diet and nutrition discussed Advised to decrease amount of daily carbohydrate intake and daily calories and increase amount of plant-based protein in his diet Continue Wegovy 1 mg weekly

## 2023-04-13 NOTE — Assessment & Plan Note (Signed)
Diet and nutrition discussed Continue semaglutide 1 mg weekly

## 2023-04-13 NOTE — Patient Instructions (Signed)
Health Maintenance, Male Adopting a healthy lifestyle and getting preventive care are important in promoting health and wellness. Ask your health care provider about: The right schedule for you to have regular tests and exams. Things you can do on your own to prevent diseases and keep yourself healthy. What should I know about diet, weight, and exercise? Eat a healthy diet  Eat a diet that includes plenty of vegetables, fruits, low-fat dairy products, and lean protein. Do not eat a lot of foods that are high in solid fats, added sugars, or sodium. Maintain a healthy weight Body mass index (BMI) is a measurement that can be used to identify possible weight problems. It estimates body fat based on height and weight. Your health care provider can help determine your BMI and help you achieve or maintain a healthy weight. Get regular exercise Get regular exercise. This is one of the most important things you can do for your health. Most adults should: Exercise for at least 150 minutes each week. The exercise should increase your heart rate and make you sweat (moderate-intensity exercise). Do strengthening exercises at least twice a week. This is in addition to the moderate-intensity exercise. Spend less time sitting. Even light physical activity can be beneficial. Watch cholesterol and blood lipids Have your blood tested for lipids and cholesterol at 63 years of age, then have this test every 5 years. You may need to have your cholesterol levels checked more often if: Your lipid or cholesterol levels are high. You are older than 63 years of age. You are at high risk for heart disease. What should I know about cancer screening? Many types of cancers can be detected early and may often be prevented. Depending on your health history and family history, you may need to have cancer screening at various ages. This may include screening for: Colorectal cancer. Prostate cancer. Skin cancer. Lung  cancer. What should I know about heart disease, diabetes, and high blood pressure? Blood pressure and heart disease High blood pressure causes heart disease and increases the risk of stroke. This is more likely to develop in people who have high blood pressure readings or are overweight. Talk with your health care provider about your target blood pressure readings. Have your blood pressure checked: Every 3-5 years if you are 18-39 years of age. Every year if you are 40 years old or older. If you are between the ages of 65 and 75 and are a current or former smoker, ask your health care provider if you should have a one-time screening for abdominal aortic aneurysm (AAA). Diabetes Have regular diabetes screenings. This checks your fasting blood sugar level. Have the screening done: Once every three years after age 45 if you are at a normal weight and have a low risk for diabetes. More often and at a younger age if you are overweight or have a high risk for diabetes. What should I know about preventing infection? Hepatitis B If you have a higher risk for hepatitis B, you should be screened for this virus. Talk with your health care provider to find out if you are at risk for hepatitis B infection. Hepatitis C Blood testing is recommended for: Everyone born from 1945 through 1965. Anyone with known risk factors for hepatitis C. Sexually transmitted infections (STIs) You should be screened each year for STIs, including gonorrhea and chlamydia, if: You are sexually active and are younger than 63 years of age. You are older than 63 years of age and your   health care provider tells you that you are at risk for this type of infection. Your sexual activity has changed since you were last screened, and you are at increased risk for chlamydia or gonorrhea. Ask your health care provider if you are at risk. Ask your health care provider about whether you are at high risk for HIV. Your health care provider  may recommend a prescription medicine to help prevent HIV infection. If you choose to take medicine to prevent HIV, you should first get tested for HIV. You should then be tested every 3 months for as long as you are taking the medicine. Follow these instructions at home: Alcohol use Do not drink alcohol if your health care provider tells you not to drink. If you drink alcohol: Limit how much you have to 0-2 drinks a day. Know how much alcohol is in your drink. In the [place] one drink equals one 12 oz bottle of beer (355 mL), one 5 oz glass of wine (148 mL), or one 1 oz glass of hard liquor (44 mL). Lifestyle Do not use any products that contain nicotine or tobacco. These products include cigarettes, chewing tobacco, and vaping devices, such as e-cigarettes. If you need help quitting, ask your health care provider. Do not use street drugs. Do not share needles. Ask your health care provider for help if you need support or information about quitting drugs. General instructions Schedule regular health, dental, and eye exams. Stay current with your vaccines. Tell your health care provider if: You often feel depressed. You have ever been abused or do not feel safe at home. Summary Adopting a healthy lifestyle and getting preventive care are important in promoting health and wellness. Follow your health care provider's instructions about healthy diet, exercising, and getting tested or screened for diseases. Follow your health care provider's instructions on monitoring your cholesterol and blood pressure. This information is not intended to replace advice given to you by your health care provider. Make sure you discuss any questions you have with your health care provider. Document Revised: 03/17/2021 Document Reviewed: 03/17/2021 Elsevier Patient Education  2024 Elsevier Inc.  

## 2023-04-13 NOTE — Telephone Encounter (Signed)
See additional phone note. 

## 2023-04-13 NOTE — Telephone Encounter (Signed)
Per note from Dr. Marina Goodell pt can just have previsit and colon. Pt scheduled to have colon at [facility] 05/04/23 at 8:30am. Case#-[id number].

## 2023-04-13 NOTE — Progress Notes (Signed)
[name] [name] 62 y.o.   Chief Complaint  Patient presents with   Annual Exam    Ears clogged     HISTORY OF PRESENT ILLNESS: This is a 62 y.o. male here for annual exam Hide robotic assisted prostate surgery October 07, 2022 Recently started on Wegovy 1 mg weekly.  Dealing with GI side effects but working well.  Trying to eat better. Overall doing well. No other complaints or medical concerns today.  HPI   Prior to Admission medications   Medication Sig Start Date End Date Taking? Authorizing Provider  finasteride (PROSCAR) 5 MG tablet Take 5 mg by mouth every morning.   Yes [provider]  Semaglutide-Weight Management (WEGOVY) 1 MG/0.5ML SOAJ Inject 1 mg into the skin once a week. 12/22/22  Yes Fani Rotondo, Eilleen Kempf, MD  senna-docusate (SENOKOT-S) 8.6-50 MG tablet Take 1 tablet by mouth 2 (two) times daily. While taking strong pain meds to prevent constipation 10/07/22  Yes Manny, Delbert Phenix., MD    No Known Allergies  Patient Active Problem List   Diagnosis Date Noted   BPH with obstruction/lower urinary tract symptoms 10/07/2022   Prediabetes 03/31/2022   Chronic pain of right knee 03/31/2022   Other hyperlipidemia 04/01/2021   Vitamin D deficiency 04/01/2021   Tobacco abuse 12/07/2018   Multinodular goiter 09/21/2012   Cigarette smoker 08/19/2010   BENIGN PROSTATIC HYPERTROPHY, HX OF 08/19/2010   HYPERCHOLESTEROLEMIA 04/25/2009   Morbid obesity (HCC) 09/06/2007   OBSTRUCTIVE SLEEP APNEA 09/06/2007    Past Medical History:  Diagnosis Date   Arthritis    Back pain    BPH (benign prostatic hyperplasia)    Difficult intubation    Epididymitis    Hyperlipidemia    Joint pain    Morbid obesity (HCC)    OSA (obstructive sleep apnea)    Pneumonia    Pre-diabetes    Sleep apnea    Upper airway cough syndrome     Past Surgical History:  Procedure Laterality Date   CATARACT EXTRACTION W/ INTRAOCULAR LENS IMPLANT Bilateral    TESTICLE REMOVAL   age 59   WISDOM TOOTH EXTRACTION     XI ROBOTIC ASSISTED SIMPLE PROSTATECTOMY N/A 10/07/2022   Procedure: XI ROBOTIC ASSISTED SIMPLE PROSTATECTOMY;  Surgeon: Sebastian Ache, MD;  Location: [facility];  Service: Urology;  Laterality: N/A;  3 HRS    Social History   Socioeconomic History   Marital status: Married    Spouse name: Not on file   Number of children: Not on file   Years of education: Not on file   Highest education level: Not on file  Occupational History   Occupation: Teacher, music: [employer]  Tobacco Use   Smoking status: Every Day    Packs/day: 0.50    Years: 25.00    Additional pack years: 0.00    Total pack years: 12.50    Types: Cigarettes   Smokeless tobacco: Never   Tobacco comments:    1/2 pack per day 08/26/21  Vaping Use   Vaping Use: Never used  Substance and Sexual Activity   Alcohol use: Yes    Alcohol/week: 3.0 - 4.0 standard drinks of alcohol    Types: 3 - 4 Standard drinks or equivalent per week   Drug use: No   Sexual activity: Never  Other Topics Concern   Not on file  Social History Narrative   Not on file   Social Determinants of Health  Financial Resource Strain: Not on file  Food Insecurity: No Food Insecurity (10/07/2022)   Hunger Vital Sign    Worried About Running Out of Food in the Last Year: Never true    Ran Out of Food in the Last Year: Never true  Transportation Needs: No Transportation Needs (10/07/2022)   PRAPARE - Administrator, Civil Service (Medical): No    Lack of Transportation (Non-Medical): No  Physical Activity: Not on file  Stress: Not on file  Social Connections: Not on file  Intimate Partner Violence: Not At Risk (10/07/2022)   Humiliation, Afraid, Rape, and Kick questionnaire    Fear of Current or Ex-Partner: No    Emotionally Abused: No    Physically Abused: No    Sexually Abused: No    Family History  Problem Relation Age of Onset   Coronary artery disease  Mother 80   Diabetes Mother    Heart disease Mother    Hyperlipidemia Mother    Heart failure Mother    Diabetes Father    Heart disease Father    Diabetes Brother        older brother   Cancer Neg Hx      Review of Systems  Constitutional: Negative.  Negative for chills and fever.  HENT: Negative.  Negative for congestion and sore throat.   Respiratory: Negative.  Negative for cough and shortness of breath.   Cardiovascular: Negative.  Negative for chest pain and palpitations.  Gastrointestinal:  Negative for abdominal pain, diarrhea, nausea and vomiting.  Genitourinary: Negative.  Negative for dysuria and hematuria.  Skin: Negative.  Negative for rash.  Neurological: Negative.  Negative for dizziness and headaches.  All other systems reviewed and are negative.   Vitals:   04/13/23 1352  BP: 118/68  Pulse: 84  Temp: 98.1 F (36.7 C)  SpO2: 96%    Physical Exam Constitutional:      Appearance: Normal appearance. He is obese.  HENT:     Head: Normocephalic.     Right Ear: There is impacted cerumen.     Left Ear: Tympanic membrane, ear canal and external ear normal.     Mouth/Throat:     Mouth: Mucous membranes are moist.     Pharynx: Oropharynx is clear.  Eyes:     Extraocular Movements: Extraocular movements intact.     Conjunctiva/sclera: Conjunctivae normal.     Pupils: Pupils are equal, round, and reactive to light.  Cardiovascular:     Rate and Rhythm: Normal rate and regular rhythm.     Pulses: Normal pulses.     Heart sounds: Normal heart sounds.  Pulmonary:     Effort: Pulmonary effort is normal.     Breath sounds: Normal breath sounds.  Abdominal:     Palpations: Abdomen is soft.     Tenderness: There is no abdominal tenderness.  Musculoskeletal:     Cervical back: No tenderness.     Right lower leg: No edema.     Left lower leg: No edema.  Lymphadenopathy:     Cervical: No cervical adenopathy.  Skin:    General: Skin is warm and dry.      Capillary Refill: Capillary refill takes less than 2 seconds.  Neurological:     General: No focal deficit present.     Mental Status: He is alert and oriented to person, place, and time.  Psychiatric:        Mood and Affect: Mood normal.  Behavior: Behavior normal.      ASSESSMENT & PLAN: Problem List Items Addressed This Visit       Genitourinary   BPH with obstruction/lower urinary tract symptoms    Status post robotic assisted prostate surgery last November Follows up with urologist on a regular basis Still taking finasteride 5 mg daily        Other   Morbid obesity (HCC)    Diet and nutrition discussed Advised to decrease amount of daily carbohydrate intake and daily calories and increase amount of plant-based protein in his diet Continue Wegovy 1 mg weekly      Cigarette smoker    Cardiovascular and cancer risks associated with smoking discussed Smoking cessation advice given.      Other hyperlipidemia    Diet and nutrition discussed Lipid profile done today      Prediabetes    Diet and nutrition discussed Continue semaglutide 1 mg weekly      Other Visit Diagnoses     Routine general medical examination at a health care facility    -  Primary   Relevant Orders   CBC with Differential   Comprehensive metabolic panel   Hemoglobin A1c   Lipid panel   Screening for deficiency anemia       Relevant Orders   CBC with Differential   Screening for lipoid disorders       Relevant Orders   Lipid panel   Screening for endocrine, metabolic and immunity disorder       Relevant Orders   Comprehensive metabolic panel   Hemoglobin A1c        Modifiable risk factors discussed with patient. Anticipatory guidance according to age provided. The following topics were also discussed: Social Determinants of Health Smoking and cardiovascular/cancer risk associated with smoking.  Smoking cessation advice given Diet and nutrition and need to decrease  amount of daily carbohydrate intake and daily calories and increase amount of plant-based protein in his diet Benefits of exercise Cancer screening and need for colon cancer screening with colonoscopy Vaccinations reviewed recommendations Cardiovascular risk assessment and need for blood work The 10-year ASCVD risk score (Arnett DK, et al., 2019) is: 26.6%   Values used to calculate the score:     Age: 70 years     Sex: Male     Is Non-Hispanic African American: Yes     Diabetic: Yes     Tobacco smoker: Yes     Systolic Blood Pressure: 118 mmHg     Is BP treated: No     HDL Cholesterol: 29.8 mg/dL     Total Cholesterol: 174 mg/dL Review of chronic medical conditions and review of all medications Mental health including depression and anxiety Fall and accident prevention  Patient Instructions  Health Maintenance, Male Adopting a healthy lifestyle and getting preventive care are important in promoting health and wellness. Ask your health care provider about: The right schedule for you to have regular tests and exams. Things you can do on your own to prevent diseases and keep yourself healthy. What should I know about diet, weight, and exercise? Eat a healthy diet  Eat a diet that includes plenty of vegetables, fruits, low-fat dairy products, and lean protein. Do not eat a lot of foods that are high in solid fats, added sugars, or sodium. Maintain a healthy weight Body mass index (BMI) is a measurement that can be used to identify possible weight problems. It estimates body fat based on height and weight. Your health  care provider can help determine your BMI and help you achieve or maintain a healthy weight. Get regular exercise Get regular exercise. This is one of the most important things you can do for your health. Most adults should: Exercise for at least 150 minutes each week. The exercise should increase your heart rate and make you sweat (moderate-intensity exercise). Do  strengthening exercises at least twice a week. This is in addition to the moderate-intensity exercise. Spend less time sitting. Even light physical activity can be beneficial. Watch cholesterol and blood lipids Have your blood tested for lipids and cholesterol at 63 years of age, then have this test every 5 years. You may need to have your cholesterol levels checked more often if: Your lipid or cholesterol levels are high. You are older than 63 years of age. You are at high risk for heart disease. What should I know about cancer screening? Many types of cancers can be detected early and may often be prevented. Depending on your health history and family history, you may need to have cancer screening at various ages. This may include screening for: Colorectal cancer. Prostate cancer. Skin cancer. Lung cancer. What should I know about heart disease, diabetes, and high blood pressure? Blood pressure and heart disease High blood pressure causes heart disease and increases the risk of stroke. This is more likely to develop in people who have high blood pressure readings or are overweight. Talk with your health care provider about your target blood pressure readings. Have your blood pressure checked: Every 3-5 years if you are 71-29 years of age. Every year if you are 78 years old or older. If you are between the ages of 52 and 80 and are a current or former smoker, ask your health care provider if you should have a one-time screening for abdominal aortic aneurysm (AAA). Diabetes Have regular diabetes screenings. This checks your fasting blood sugar level. Have the screening done: Once every three years after age 49 if you are at a normal weight and have a low risk for diabetes. More often and at a younger age if you are overweight or have a high risk for diabetes. What should I know about preventing infection? Hepatitis B If you have a higher risk for hepatitis B, you should be screened for  this virus. Talk with your health care provider to find out if you are at risk for hepatitis B infection. Hepatitis C Blood testing is recommended for: Everyone born from [birth year] through 1966. Anyone with known risk factors for hepatitis C. Sexually transmitted infections (STIs) You should be screened each year for STIs, including gonorrhea and chlamydia, if: You are sexually active and are younger than 63 years of age. You are older than 63 years of age and your health care provider tells you that you are at risk for this type of infection. Your sexual activity has changed since you were last screened, and you are at increased risk for chlamydia or gonorrhea. Ask your health care provider if you are at risk. Ask your health care provider about whether you are at high risk for HIV. Your health care provider may recommend a prescription medicine to help prevent HIV infection. If you choose to take medicine to prevent HIV, you should first get tested for HIV. You should then be tested every 3 months for as long as you are taking the medicine. Follow these instructions at home: Alcohol use Do not drink alcohol if your health care provider tells you not  to drink. If you drink alcohol: Limit how much you have to 0-2 drinks a day. Know how much alcohol is in your drink. In the [place] one drink equals one 12 oz bottle of beer (355 mL), one 5 oz glass of wine (148 mL), or one 1 oz glass of hard liquor (44 mL). Lifestyle Do not use any products that contain nicotine or tobacco. These products include cigarettes, chewing tobacco, and vaping devices, such as e-cigarettes. If you need help quitting, ask your health care provider. Do not use street drugs. Do not share needles. Ask your health care provider for help if you need support or information about quitting drugs. General instructions Schedule regular health, dental, and eye exams. Stay current with your vaccines. Tell your health care provider  if: You often feel depressed. You have ever been abused or do not feel safe at home. Summary Adopting a healthy lifestyle and getting preventive care are important in promoting health and wellness. Follow your health care provider's instructions about healthy diet, exercising, and getting tested or screened for diseases. Follow your health care provider's instructions on monitoring your cholesterol and blood pressure. This information is not intended to replace advice given to you by your health care provider. Make sure you discuss any questions you have with your health care provider. Document Revised: 03/17/2021 Document Reviewed: 03/17/2021 Elsevier Patient Education  2024 Elsevier Inc.     Edwina Barth, MD [facility]

## 2023-04-15 ENCOUNTER — Other Ambulatory Visit: Payer: Self-pay

## 2023-04-15 ENCOUNTER — Ambulatory Visit (AMBULATORY_SURGERY_CENTER): Payer: BC Managed Care – PPO

## 2023-04-15 VITALS — Ht 71.5 in | Wt 272.0 lb

## 2023-04-15 DIAGNOSIS — Z86010 Personal history of colonic polyps: Secondary | ICD-10-CM

## 2023-04-15 MED ORDER — NA SULFATE-K SULFATE-MG SULF 17.5-3.13-1.6 GM/177ML PO SOLN: 1.0000 | Freq: Once | ORAL | 0 refills | Status: AC

## 2023-04-15 NOTE — Progress Notes (Signed)
Denies allergies to eggs or soy products. Denies complication of anesthesia or sedation. Denies use of weight loss medication. Denies use of O2.   Emmi instructions given for colonoscopy.  

## 2023-04-20 ENCOUNTER — Encounter: Payer: Self-pay | Admitting: Internal Medicine

## 2023-04-26 NOTE — Progress Notes (Signed)
Anesthesia Review:  PCP: Edwina Barth  Cardiologist :none  Chest x-ray : EKG : 09/24/22  Echo : 2020  Stress test: Cardiac Cath :  Activity level:  Sleep Study/ CPAP : has cpap  Fasting Blood Sugar :      / Checks Blood Sugar -- times a day:   Blood Thinner/ Instructions /Last Dose: ASA / Instructions/ Last Dose :    PreDM- hgba1c- 04/13/23- -6.4    Wegovy- last dose on approx 04/10/23 per pt    Smoker- pt aware no smoking after midnite.   Glasses

## 2023-04-29 ENCOUNTER — Encounter: Payer: BC Managed Care – PPO | Admitting: Internal Medicine

## 2023-05-04 ENCOUNTER — Other Ambulatory Visit: Payer: Self-pay

## 2023-05-04 ENCOUNTER — Ambulatory Visit (HOSPITAL_COMMUNITY): Payer: BC Managed Care – PPO | Admitting: Anesthesiology

## 2023-05-04 ENCOUNTER — Encounter (HOSPITAL_COMMUNITY)
Admission: RE | Disposition: A | Discharge status: Home / Self Care | Payer: Self-pay | Source: Home / Self Care | Attending: Internal Medicine

## 2023-05-04 ENCOUNTER — Ambulatory Visit (HOSPITAL_COMMUNITY)
Admission: RE | Admit: 2023-05-04 | Discharge: 2023-05-04 | Disposition: A | Discharge status: Home / Self Care | Payer: BC Managed Care – PPO | Attending: Internal Medicine | Admitting: Internal Medicine

## 2023-05-04 ENCOUNTER — Encounter (HOSPITAL_COMMUNITY): Payer: Self-pay | Admitting: Internal Medicine

## 2023-05-04 DIAGNOSIS — K5730 Diverticulosis of large intestine without perforation or abscess without bleeding: Secondary | ICD-10-CM

## 2023-05-04 DIAGNOSIS — Z1211 Encounter for screening for malignant neoplasm of colon: Secondary | ICD-10-CM | POA: Insufficient documentation

## 2023-05-04 DIAGNOSIS — Z86010 Personal history of colonic polyps: Secondary | ICD-10-CM | POA: Diagnosis not present

## 2023-05-04 DIAGNOSIS — F17210 Nicotine dependence, cigarettes, uncomplicated: Secondary | ICD-10-CM | POA: Insufficient documentation

## 2023-05-04 DIAGNOSIS — D122 Benign neoplasm of ascending colon: Secondary | ICD-10-CM | POA: Insufficient documentation

## 2023-05-04 DIAGNOSIS — D123 Benign neoplasm of transverse colon: Secondary | ICD-10-CM | POA: Diagnosis not present

## 2023-05-04 DIAGNOSIS — D120 Benign neoplasm of cecum: Secondary | ICD-10-CM | POA: Diagnosis not present

## 2023-05-04 HISTORY — PX: COLONOSCOPY WITH PROPOFOL: SHX5780

## 2023-05-04 HISTORY — PX: POLYPECTOMY: SHX5525

## 2023-05-04 SURGERY — COLONOSCOPY WITH PROPOFOL
Anesthesia: Monitor Anesthesia Care

## 2023-05-04 MED ORDER — PROPOFOL 500 MG/50ML IV EMUL
INTRAVENOUS | Status: AC
  Filled 2023-05-04: qty 50

## 2023-05-04 MED ORDER — ONDANSETRON HCL 4 MG/2ML IJ SOLN
INTRAMUSCULAR | Status: DC | PRN
  Administered 2023-05-04: 4 mg via INTRAVENOUS

## 2023-05-04 MED ORDER — LACTATED RINGERS IV SOLN: INTRAVENOUS | Status: DC

## 2023-05-04 MED ORDER — PROPOFOL 500 MG/50ML IV EMUL
INTRAVENOUS | Status: DC | PRN
  Administered 2023-05-04: 135 ug/kg/min via INTRAVENOUS

## 2023-05-04 MED ORDER — PROPOFOL 10 MG/ML IV BOLUS
INTRAVENOUS | Status: DC | PRN
  Administered 2023-05-04: 30 mg via INTRAVENOUS
  Administered 2023-05-04: 10 mg via INTRAVENOUS

## 2023-05-04 MED ORDER — SODIUM CHLORIDE 0.9 % IV SOLN: INTRAVENOUS | Status: DC

## 2023-05-04 SURGICAL SUPPLY — 22 items

## 2023-05-04 NOTE — H&P (Signed)
HISTORY OF PRESENT ILLNESS:  [name] [name] is a 62 y.o. male With a history of multiple adenomatous colon polyps on his index and only colonoscopy 2012.  Overdue for follow-up.  No clinical complaints.  Now for colonoscopy.  REVIEW OF SYSTEMS:  All non-GI ROS negative  Past Medical History:  Diagnosis Date   Arthritis    Back pain    BPH (benign prostatic hyperplasia)    Cataract    Difficult intubation    Epididymitis    Hyperlipidemia    Joint pain    Morbid obesity (HCC)    OSA (obstructive sleep apnea)    Pneumonia    Pre-diabetes    Sleep apnea    Upper airway cough syndrome     Past Surgical History:  Procedure Laterality Date   CATARACT EXTRACTION W/ INTRAOCULAR LENS IMPLANT Bilateral    TESTICLE REMOVAL  age 72   WISDOM TOOTH EXTRACTION     XI ROBOTIC ASSISTED SIMPLE PROSTATECTOMY N/A 10/07/2022   Procedure: XI ROBOTIC ASSISTED SIMPLE PROSTATECTOMY;  Surgeon: Sebastian Ache, MD;  Location: [facility];  Service: Urology;  Laterality: N/A;  3 HRS    Social History [employer]  reports that he has been smoking cigarettes. He has a 12.50 pack-year smoking history. He has never used smokeless tobacco. He reports current alcohol use of about 3.0 - 4.0 standard drinks of alcohol per week. He reports that he does not use drugs.  family history includes Coronary artery disease (age of onset: 79) in his mother; Diabetes in his brother, father, and mother; Heart disease in his father and mother; Heart failure in his mother; Hyperlipidemia in his mother.  No Known Allergies     PHYSICAL EXAMINATION: Vital signs: BP (!) 140/67   Pulse 64   Temp 97.7 F (36.5 C) (Temporal)   Resp 14   Ht 5\' 11"  (1.803 m)   Wt 124.7 kg   SpO2 95%   BMI 38.35 kg/m  General: Well-developed, well-nourished, no acute distress HEENT: Sclerae are anicteric, conjunctiva pink. Oral mucosa intact Lungs: Clear Heart: Regular Abdomen: soft, nontender, nondistended, no obvious ascites,  no peritoneal signs, normal bowel sounds. No organomegaly. Extremities: No edema Psychiatric: alert and oriented x3. Cooperative     ASSESSMENT:  1.  Personal history of adenomatous colon polyps   PLAN:  1.  Surveillance colonoscopy

## 2023-05-04 NOTE — Anesthesia Postprocedure Evaluation (Signed)
Anesthesia Post Note  Patient: [name] [name]  Procedure(s) Performed: COLONOSCOPY WITH PROPOFOL POLYPECTOMY     Patient location during evaluation: PACU Anesthesia Type: MAC Level of consciousness: awake and alert Pain management: pain level controlled Vital Signs Assessment: post-procedure vital signs reviewed and stable Respiratory status: spontaneous breathing, nonlabored ventilation, respiratory function stable and patient connected to nasal cannula oxygen Cardiovascular status: stable and blood pressure returned to baseline Postop Assessment: no apparent nausea or vomiting Anesthetic complications: no  No notable events documented.  Last Vitals:  Vitals:   05/04/23 1050 05/04/23 1055  BP: 128/79   Pulse: 61 (!) 57  Resp: 18 17  Temp:    SpO2: 95% 97%    Last Pain:  Vitals:   05/04/23 1050  TempSrc:   PainSc: 0-No pain                 Elisabet Gutzmer S

## 2023-05-04 NOTE — Anesthesia Procedure Notes (Signed)
Procedure Name: MAC Date/Time: 05/04/2023 9:38 AM  Performed by: Nelle Don, CRNAPre-anesthesia Checklist: Patient identified, Emergency Drugs available, Suction available and Patient being monitored Oxygen Delivery Method: Simple face mask

## 2023-05-04 NOTE — Discharge Instructions (Signed)
YOU HAD AN ENDOSCOPIC PROCEDURE TODAY: Refer to the procedure report and other information in the discharge instructions given to you for any specific questions about what was found during the examination. If this information does not answer your questions, please call [place] office at [phone] to clarify.  ° °YOU SHOULD EXPECT: Some feelings of bloating in the abdomen. Passage of more gas than usual. Walking can help get rid of the air that was put into your GI tract during the procedure and reduce the bloating. If you had a lower endoscopy (such as a colonoscopy or flexible sigmoidoscopy) you may notice spotting of blood in your stool or on the toilet paper. Some abdominal soreness may be present for a day or two, also. ° °DIET: Your first meal following the procedure should be a light meal and then it is ok to progress to your normal diet. A half-sandwich or bowl of soup is an example of a good first meal. Heavy or fried foods are harder to digest and may make you feel nauseous or bloated. Drink plenty of fluids but you should avoid alcoholic beverages for 24 hours. If you had a esophageal dilation, please see attached instructions for diet.   ° °ACTIVITY: Your care partner should take you home directly after the procedure. You should plan to take it easy, moving slowly for the rest of the day. You can resume normal activity the day after the procedure however YOU SHOULD NOT DRIVE, use power tools, machinery or perform tasks that involve climbing or major physical exertion for 24 hours (because of the sedation medicines used during the test).  ° °SYMPTOMS TO REPORT IMMEDIATELY: °A gastroenterologist can be reached at any hour. Please call [phone]  for any of the following symptoms:  °Following lower endoscopy (colonoscopy, flexible sigmoidoscopy) °Excessive amounts of blood in the stool  °Significant tenderness, worsening of abdominal pains  °Swelling of the abdomen that is new, acute  °Fever of 100° or  higher  °Following upper endoscopy (EGD, EUS, ERCP, esophageal dilation) °Vomiting of blood or coffee ground material  °New, significant abdominal pain  °New, significant chest pain or pain under the shoulder blades  °Painful or persistently difficult swallowing  °New shortness of breath  °Black, tarry-looking or red, bloody stools ° °FOLLOW UP:  °If any biopsies were taken you will be contacted by phone or by letter within the next 1-3 weeks. Call [phone]  if you have not heard about the biopsies in 3 weeks.  °Please also call with any specific questions about appointments or follow up tests. ° °

## 2023-05-04 NOTE — Op Note (Signed)
[facility] Patient Name: [name] [name] Procedure Date: 05/04/2023 MRN: [id number] Attending MD: Wilhemina Bonito. Marina Goodell , MD, [id number] Date of Birth: [birth year] CSN: [id number] Age: 62 Admit Type: Outpatient Procedure:                Colonoscopy with cold snare polypectomy x 4; biopsy                            polypectomy x 1 Indications:              High risk colon cancer surveillance: Personal                            history of multiple (3 or more) adenomas. Previous                            examination (index examination) in 2012. Overdue                            for follow-up. Providers:                Wilhemina Bonito. Marina Goodell, MD, Fransisca Connors, Martha Clan, RN, Kandice Robinsons, Technician Referring MD:             Edwina Barth MD Medicines:                Monitored Anesthesia Care Complications:            No immediate complications. Estimated blood loss:                            None. Estimated Blood Loss:     Estimated blood loss: none. Procedure:                Pre-Anesthesia Assessment:                           - Prior to the procedure, a History and Physical                            was performed, and patient medications and                            allergies were reviewed. The patient's tolerance of                            previous anesthesia was also reviewed. The risks                            and benefits of the procedure and the sedation                            options and risks were discussed with the patient.  All questions were answered, and informed consent                            was obtained. Prior Anticoagulants: The patient has                            taken no anticoagulant or antiplatelet agents.                            After reviewing the risks and benefits, the patient                            was deemed in satisfactory condition to undergo the                             procedure.                           After obtaining informed consent, the colonoscope                            was passed under direct vision. Throughout the                            procedure, the patient's blood pressure, pulse, and                            oxygen saturations were monitored continuously. The                            CF-HQ190L ([id number] Olympus colonoscope was                            introduced through the anus and advanced to the the                            cecum, identified by appendiceal orifice and                            ileocecal valve. The ileocecal valve, appendiceal                            orifice, and rectum were photographed. The quality                            of the bowel preparation was excellent. The                            colonoscopy was performed without difficulty. The                            patient tolerated the procedure well. The bowel  preparation used was SUPREP via split dose                            instruction. Scope In: 9:52:20 AM Scope Out: 10:08:27 AM Scope Withdrawal Time: 0 hours 13 minutes 59 seconds  Total Procedure Duration: 0 hours 16 minutes 7 seconds  Findings:      A less than 1 mm polyp was found in the cecum. The polyp was removed       with a jumbo cold forceps. Resection and retrieval were complete.      Four polyps were found in the transverse colon and ascending colon. The       polyps were 2 to 8 mm in size. These polyps were removed with a cold       snare. Resection and retrieval were complete.      Multiple diverticula were found in the left colon.      The exam was otherwise without abnormality on direct and retroflexion       views. Impression:               - One less than 1 mm polyp in the cecum, removed                            with a jumbo cold forceps. Resected and retrieved.                           - Four 2 to 8 mm polyps in the transverse  colon and                            in the ascending colon, removed with a cold snare.                            Resected and retrieved.                           - Diverticulosis in the left colon.                           - The examination was otherwise normal on direct                            and retroflexion views. Moderate Sedation:      none Recommendation:           - Repeat colonoscopy in 3 years for surveillance.                           - Patient has a contact number available for                            emergencies. The signs and symptoms of potential                            delayed complications were discussed with the  patient. Return to normal activities tomorrow.                            Written discharge instructions were provided to the                            patient.                           - Resume previous diet.                           - Continue present medications.                           - Await pathology results. Procedure Code(s):        --- Professional ---                           [phone], Colonoscopy, flexible; with removal of                            tumor(s), polyp(s), or other lesion(s) by snare                            technique                           [id number], 59, Colonoscopy, flexible; with biopsy,                            single or multiple Diagnosis Code(s):        --- Professional ---                           Z86.010, Personal history of colonic polyps                           D12.0, Benign neoplasm of cecum                           D12.3, Benign neoplasm of transverse colon (hepatic                            flexure or splenic flexure)                           D12.2, Benign neoplasm of ascending colon                           K57.30, Diverticulosis of large intestine without                            perforation or abscess without bleeding CPT copyright 2022 American Medical  Association. All rights reserved. The codes documented in this report are preliminary and upon coder review may  be revised to meet current compliance requirements. Wilhemina Bonito. Marina Goodell, MD 05/04/2023 10:16:38 AM This  report has been signed electronically. Number of Addenda: 0

## 2023-05-04 NOTE — Transfer of Care (Signed)
Immediate Anesthesia Transfer of Care Note  Patient: [name] [name]  Procedure(s) Performed: COLONOSCOPY WITH PROPOFOL POLYPECTOMY  Patient Location: PACU  Anesthesia Type:MAC  Level of Consciousness: awake, alert , and oriented  Airway & Oxygen Therapy: Patient Spontanous Breathing and Patient connected to face mask oxygen  Post-op Assessment: Report given to RN, Post -op Vital signs reviewed and stable, and Patient moving all extremities X 4  Post vital signs: Reviewed and stable  Last Vitals:  Vitals Value Taken Time  BP 100/38 05/04/23 1012  Temp    Pulse 72 05/04/23 1015  Resp 16 05/04/23 1015  SpO2 100 % 05/04/23 1015  Vitals shown include unvalidated device data.  Last Pain:  Vitals:   05/04/23 0806  TempSrc: Temporal  PainSc: 0-No pain         Complications: No notable events documented.

## 2023-05-04 NOTE — Anesthesia Preprocedure Evaluation (Signed)
Anesthesia Evaluation  Patient identified by MRN, date of birth, ID band Patient awake    Reviewed: Allergy & Precautions, H&P , NPO status , Patient's Chart, lab work & pertinent test results  History of Anesthesia Complications (+) DIFFICULT AIRWAY and history of anesthetic complications  Airway Mallampati: III  TM Distance: <3 FB Neck ROM: Full    Dental no notable dental hx.    Pulmonary neg pulmonary ROS, Current Smoker and Patient abstained from smoking.   Pulmonary exam normal breath sounds clear to auscultation       Cardiovascular negative cardio ROS Normal cardiovascular exam Rhythm:Regular Rate:Normal     Neuro/Psych negative neurological ROS  negative psych ROS   GI/Hepatic negative GI ROS, Neg liver ROS,,,  Endo/Other  obesity  Renal/GU negative Renal ROS  negative genitourinary   Musculoskeletal negative musculoskeletal ROS (+)    Abdominal   Peds negative pediatric ROS (+)  Hematology negative hematology ROS (+)   Anesthesia Other Findings   Reproductive/Obstetrics negative OB ROS                             Anesthesia Physical Anesthesia Plan  ASA: 2  Anesthesia Plan: MAC   Post-op Pain Management: Minimal or no pain anticipated   Induction: Intravenous  PONV Risk Score and Plan: 1 and Propofol infusion and Treatment may vary due to age or medical condition  Airway Management Planned: Simple Face Mask  Additional Equipment:   Intra-op Plan:   Post-operative Plan:   Informed Consent: I have reviewed the patients History and Physical, chart, labs and discussed the procedure including the risks, benefits and alternatives for the proposed anesthesia with the patient or authorized representative who has indicated his/her understanding and acceptance.     Dental advisory given  Plan Discussed with: CRNA and Surgeon  Anesthesia Plan Comments:         Anesthesia Quick Evaluation

## 2023-05-05 LAB — SURGICAL PATHOLOGY

## 2023-05-07 ENCOUNTER — Encounter (HOSPITAL_COMMUNITY): Payer: Self-pay | Admitting: Internal Medicine

## 2023-05-20 ENCOUNTER — Encounter: Payer: BC Managed Care – PPO | Admitting: Internal Medicine

## 2023-05-22 ENCOUNTER — Encounter (HOSPITAL_BASED_OUTPATIENT_CLINIC_OR_DEPARTMENT_OTHER): Payer: Self-pay | Admitting: Emergency Medicine

## 2023-05-22 ENCOUNTER — Other Ambulatory Visit: Payer: Self-pay

## 2023-05-22 ENCOUNTER — Other Ambulatory Visit (HOSPITAL_BASED_OUTPATIENT_CLINIC_OR_DEPARTMENT_OTHER): Payer: Self-pay

## 2023-05-22 ENCOUNTER — Emergency Department (HOSPITAL_BASED_OUTPATIENT_CLINIC_OR_DEPARTMENT_OTHER)
Admission: EM | Admit: 2023-05-22 | Discharge: 2023-05-22 | Disposition: A | Discharge status: Home / Self Care | Payer: BC Managed Care – PPO | Attending: Emergency Medicine | Admitting: Emergency Medicine

## 2023-05-22 ENCOUNTER — Emergency Department (HOSPITAL_BASED_OUTPATIENT_CLINIC_OR_DEPARTMENT_OTHER): Payer: BC Managed Care – PPO | Admitting: Radiology

## 2023-05-22 DIAGNOSIS — Z20822 Contact with and (suspected) exposure to covid-19: Secondary | ICD-10-CM | POA: Diagnosis not present

## 2023-05-22 DIAGNOSIS — U071 COVID-19: Secondary | ICD-10-CM | POA: Diagnosis not present

## 2023-05-22 DIAGNOSIS — R519 Headache, unspecified: Secondary | ICD-10-CM | POA: Diagnosis present

## 2023-05-22 LAB — SARS CORONAVIRUS 2 BY RT PCR: SARS Coronavirus 2 by RT PCR: POSITIVE — AB

## 2023-05-22 MED ORDER — BENZONATATE 100 MG PO CAPS
100.0000 mg | ORAL_CAPSULE | Freq: Three times a day (TID) | ORAL | 0 refills | Status: DC
  Filled 2023-05-22: qty 21, 7d supply, fill #0

## 2023-05-22 MED ORDER — BENZONATATE 100 MG PO CAPS: 100.0000 mg | ORAL_CAPSULE | Freq: Three times a day (TID) | ORAL | 0 refills | Status: DC

## 2023-05-22 MED ORDER — BENZONATATE 100 MG PO CAPS
100.0000 mg | ORAL_CAPSULE | Freq: Once | ORAL | Status: AC
  Administered 2023-05-22: 100 mg via ORAL
  Filled 2023-05-22: qty 1

## 2023-05-22 NOTE — Discharge Instructions (Addendum)
It was a pleasure taking care of you today.  As discussed, your COVID test was positive which is likely causing your symptoms.  I am sending you home with cough medication.  Take as needed.  You may take over-the-counter ibuprofen or Tylenol as needed for fever and bodyaches.  Please follow-up with PCP if symptoms do not improve over the next few days.  Return to the ER for any worsening symptoms.

## 2023-05-22 NOTE — Progress Notes (Signed)
RT ambulated Pt on RA SATS 98% HR 90

## 2023-05-22 NOTE — ED Triage Notes (Signed)
Pt via pov from home with URI symptoms since Thursday. Pt reports nasal congestion, head pressure, "just not feeling well." Pt alert & oriented, nad noted.

## 2023-05-22 NOTE — ED Provider Notes (Signed)
[place] [facility] AT [facility] Provider Note   CSN: [id number] Arrival date & time: 05/22/23  4010     History  Chief Complaint  Patient presents with   Nasal Congestion   Headache    [name] [name] is a 61 y.o. male with a past medical history significant for BPH, OSA, and hyperlipidemia who presents to the ED due to URI symptoms x 2 days.  Patient admits to rhinorrhea and productive cough. Also notes he "just doesn't feel well"  States he had a headache yesterday which was relieved with Tylenol.  No current headache.  Had 1 episode of nonbloody, nonbilious emesis 2 days ago however, none since.  No chest pain or shortness of breath.  No history of asthma or COPD.  No abdominal pain. Is planning on traveling on Monday and wanted to get checked our prior to leaving town. No fever or chills.   History obtained from patient and past medical records. No interpreter used during encounter.       Home Medications Prior to Admission medications   Medication Sig Start Date End Date Taking? Authorizing Provider  benzonatate (TESSALON) 100 MG capsule Take 1 capsule (100 mg total) by mouth every 8 (eight) hours. 05/22/23  Yes Christohper Dube, Merla Riches, PA-C  Cholecalciferol (VITAMIN D3 PO) Take 1 capsule by mouth daily.    [provider]  finasteride (PROSCAR) 5 MG tablet Take 5 mg by mouth every morning.    [provider]  Multiple Vitamins-Minerals (EMERGEN-C IMMUNE PO) Take 1 tablet by mouth. With water    [provider]  Semaglutide-Weight Management (WEGOVY) 1 MG/0.5ML SOAJ Inject 1 mg into the skin once a week. 12/22/22   Georgina Quint, MD      Allergies    Patient has no known allergies.    Review of Systems   Review of Systems  Constitutional:  Negative for chills and fever.  HENT:  Positive for congestion and rhinorrhea.   Respiratory:  Positive for cough. Negative for shortness of breath.   Cardiovascular:  Negative for  chest pain.  Gastrointestinal:  Negative for abdominal pain.    Physical Exam Updated Vital Signs BP 124/85 (BP Location: Right Arm)   Pulse 82   Temp 98.5 F (36.9 C) (Oral)   Resp 17   Ht 5\' 11"  (1.803 m)   Wt 125.6 kg   BMI 38.63 kg/m  Physical Exam Vitals and nursing note reviewed.  Constitutional:      General: He is not in acute distress.    Appearance: He is not ill-appearing.  HENT:     Head: Normocephalic.  Eyes:     Pupils: Pupils are equal, round, and reactive to light.  Cardiovascular:     Rate and Rhythm: Normal rate and regular rhythm.     Pulses: Normal pulses.     Heart sounds: Normal heart sounds. No murmur heard.    No friction rub. No gallop.  Pulmonary:     Effort: Pulmonary effort is normal.     Breath sounds: Normal breath sounds.     Comments: Respirations equal and unlabored, patient able to speak in full sentences, lungs clear to auscultation bilaterally Abdominal:     General: Abdomen is flat. There is no distension.     Palpations: Abdomen is soft.     Tenderness: There is no abdominal tenderness. There is no guarding or rebound.  Musculoskeletal:        General: Normal range of motion.  Cervical back: Neck supple.  Skin:    General: Skin is warm and dry.  Neurological:     General: No focal deficit present.     Mental Status: He is alert.  Psychiatric:        Mood and Affect: Mood normal.        Behavior: Behavior normal.     ED Results / Procedures / Treatments   Labs (all labs ordered are listed, but only abnormal results are displayed) Labs Reviewed  SARS CORONAVIRUS 2 BY RT PCR - Abnormal; Notable for the following components:      Result Value   SARS Coronavirus 2 by RT PCR POSITIVE (*)    All other components within normal limits    EKG None  Radiology DG Chest 2 View  Result Date: 05/22/2023 CLINICAL DATA:  Cough EXAM: CHEST - 2 VIEW COMPARISON:  04/08/2022 FINDINGS: Mild cardiomegaly. Minimal linear scarring or  atelectasis at the right lung base. No focal airspace consolidation, pleural effusion, or pneumothorax. IMPRESSION: No active cardiopulmonary disease. Electronically Signed   By: Duanne Guess D.O.   On: 05/22/2023 11:23    Procedures Procedures    Medications Ordered in ED Medications  benzonatate (TESSALON) capsule 100 mg (100 mg Oral Given 05/22/23 1101)    ED Course/ Medical Decision Making/ A&P Clinical Course as of 05/22/23 1206  Sat May 22, 2023  1114 SARS Coronavirus 2 by RT PCR(!): POSITIVE [CA]    Clinical Course User Index [CA] Mannie Stabile, PA-C                             Medical Decision Making Amount and/or Complexity of Data Reviewed Labs:  Decision-making details documented in ED Course. Radiology: ordered and independent interpretation performed. Decision-making details documented in ED Course.  Risk Prescription drug management.   This patient presents to the ED for concern of cough, rhinorrhea, this involves an extensive number of treatment options, and is a complaint that carries with it a high risk of complications and morbidity.  The differential diagnosis includes viral process, PNA, etc  62 year old male presents to the ED due to URI symptoms x 2 days.  Patient admits to cough, nasal congestion, and headache.  No chest pain or shortness of breath.  Upon arrival, vitals all within normal limits.  Patient in no acute distress.  Lungs clear to auscultation bilaterally.  No stridor or wheeze.  No lower extremity edema.  COVID-positive which is likely causing patient's symptoms.  Chest x-ray personally reviewed and interpreted which is negative for signs of pneumonia or other acute abnormalities.  Will treat symptomatically for COVID.  I had a long discussion with patient in regards to symptomatic treatment and possible Paxlovid prescription.  Patient declined Paxlovid prescription which I find to be reasonable given he has no major risk factors.  Patient  ambulated here in the ED and maintain O2 saturation above 95% without difficulty.  No signs of respiratory distress.  Patient stable for discharge. Strict ED precautions discussed with patient. Patient states understanding and agrees to plan. Patient discharged home in no acute distress and stable vitals  Lives at home Has PCP       Final Clinical Impression(s) / ED Diagnoses Final diagnoses:  COVID-19 virus infection    Rx / DC Orders ED Discharge Orders          Ordered    benzonatate (TESSALON) 100 MG capsule  Every 8  hours        05/22/23 1204              Mannie Stabile, PA-C 05/22/23 1206    Edwin Dada P, DO 05/31/23 2324

## 2023-10-13 ENCOUNTER — Ambulatory Visit: Payer: BC Managed Care – PPO | Admitting: Emergency Medicine

## 2023-10-13 ENCOUNTER — Encounter: Payer: Self-pay | Admitting: Emergency Medicine

## 2023-10-13 DIAGNOSIS — N138 Other obstructive and reflux uropathy: Secondary | ICD-10-CM

## 2023-10-13 DIAGNOSIS — G4733 Obstructive sleep apnea (adult) (pediatric): Secondary | ICD-10-CM

## 2023-10-13 DIAGNOSIS — N401 Enlarged prostate with lower urinary tract symptoms: Secondary | ICD-10-CM | POA: Diagnosis not present

## 2023-10-13 DIAGNOSIS — R7303 Prediabetes: Secondary | ICD-10-CM

## 2023-10-13 MED ORDER — WEGOVY 1 MG/0.5ML ~~LOC~~ SOAJ
1.0000 mg | SUBCUTANEOUS | 7 refills | Status: DC
Start: 2023-10-13 — End: 2024-05-02

## 2023-10-13 MED ORDER — METFORMIN HCL 1000 MG PO TABS
1000.0000 mg | ORAL_TABLET | Freq: Two times a day (BID) | ORAL | 3 refills | Status: DC
Start: 2023-10-13 — End: 2024-05-02

## 2023-10-13 NOTE — Assessment & Plan Note (Signed)
Diet and nutrition discussed Continue semaglutide 1 mg weekly

## 2023-10-13 NOTE — Assessment & Plan Note (Signed)
Stable.  On CPAP treatment. 

## 2023-10-13 NOTE — Patient Instructions (Signed)
Calorie Counting for Weight Loss Calories are units of energy. Your body needs a certain number of calories from food to keep going throughout the day. When you eat or drink more calories than your body needs, your body stores the extra calories mostly as fat. When you eat or drink fewer calories than your body needs, your body burns fat to get the energy it needs. Calorie counting means keeping track of how many calories you eat and drink each day. Calorie counting can be helpful if you need to lose weight. If you eat fewer calories than your body needs, you should lose weight. Ask your health care provider what a healthy weight is for you. For calorie counting to work, you will need to eat the right number of calories each day to lose a healthy amount of weight per week. A dietitian can help you figure out how many calories you need in a day and will suggest ways to reach your calorie goal. A healthy amount of weight to lose each week is usually 1-2 lb (0.5-0.9 kg). This usually means that your daily calorie intake should be reduced by 500-750 calories. Eating 1,200-1,500 calories a day can help most women lose weight. Eating 1,500-1,800 calories a day can help most men lose weight. What do I need to know about calorie counting? Work with your health care provider or dietitian to determine how many calories you should get each day. To meet your daily calorie goal, you will need to: Find out how many calories are in each food that you would like to eat. Try to do this before you eat. Decide how much of the food you plan to eat. Keep a food log. Do this by writing down what you ate and how many calories it had. To successfully lose weight, it is important to balance calorie counting with a healthy lifestyle that includes regular activity. Where do I find calorie information?  The number of calories in a food can be found on a Nutrition Facts label. If a food does not have a Nutrition Facts label, try  to look up the calories online or ask your dietitian for help. Remember that calories are listed per serving. If you choose to have more than one serving of a food, you will have to multiply the calories per serving by the number of servings you plan to eat. For example, the label on a package of bread might say that a serving size is 1 slice and that there are 90 calories in a serving. If you eat 1 slice, you will have eaten 90 calories. If you eat 2 slices, you will have eaten 180 calories. How do I keep a food log? After each time that you eat, record the following in your food log as soon as possible: What you ate. Be sure to include toppings, sauces, and other extras on the food. How much you ate. This can be measured in cups, ounces, or number of items. How many calories were in each food and drink. The total number of calories in the food you ate. Keep your food log near you, such as in a pocket-sized notebook or on an app or website on your mobile phone. Some programs will calculate calories for you and show you how many calories you have left to meet your daily goal. What are some portion-control tips? Know how many calories are in a serving. This will help you know how many servings you can have of a certain   food. Use a measuring cup to measure serving sizes. You could also try weighing out portions on a kitchen scale. With time, you will be able to estimate serving sizes for some foods. Take time to put servings of different foods on your favorite plates or in your favorite bowls and cups so you know what a serving looks like. Try not to eat straight from a food's packaging, such as from a bag or box. Eating straight from the package makes it hard to see how much you are eating and can lead to overeating. Put the amount you would like to eat in a cup or on a plate to make sure you are eating the right portion. Use smaller plates, glasses, and bowls for smaller portions and to prevent  overeating. Try not to multitask. For example, avoid watching TV or using your computer while eating. If it is time to eat, sit down at a table and enjoy your food. This will help you recognize when you are full. It will also help you be more mindful of what and how much you are eating. What are tips for following this plan? Reading food labels Check the calorie count compared with the serving size. The serving size may be smaller than what you are used to eating. Check the source of the calories. Try to choose foods that are high in protein, fiber, and vitamins, and low in saturated fat, trans fat, and sodium. Shopping Read nutrition labels while you shop. This will help you make healthy decisions about which foods to buy. Pay attention to nutrition labels for low-fat or fat-free foods. These foods sometimes have the same number of calories or more calories than the full-fat versions. They also often have added sugar, starch, or salt to make up for flavor that was removed with the fat. Make a grocery list of lower-calorie foods and stick to it. Cooking Try to cook your favorite foods in a healthier way. For example, try baking instead of frying. Use low-fat dairy products. Meal planning Use more fruits and vegetables. One-half of your plate should be fruits and vegetables. Include lean proteins, such as chicken, turkey, and fish. Lifestyle Each week, aim to do one of the following: 150 minutes of moderate exercise, such as walking. 75 minutes of vigorous exercise, such as running. General information Know how many calories are in the foods you eat most often. This will help you calculate calorie counts faster. Find a way of tracking calories that works for you. Get creative. Try different apps or programs if writing down calories does not work for you. What foods should I eat?  Eat nutritious foods. It is better to have a nutritious, high-calorie food, such as an avocado, than a food with  few nutrients, such as a bag of potato chips. Use your calories on foods and drinks that will fill you up and will not leave you hungry soon after eating. Examples of foods that fill you up are nuts and nut butters, vegetables, lean proteins, and high-fiber foods such as whole grains. High-fiber foods are foods with more than 5 g of fiber per serving. Pay attention to calories in drinks. Low-calorie drinks include water and unsweetened drinks. The items listed above may not be a complete list of foods and beverages you can eat. Contact a dietitian for more information. What foods should I limit? Limit foods or drinks that are not good sources of vitamins, minerals, or protein or that are high in unhealthy fats. These   include: Candy. Other sweets. Sodas, specialty coffee drinks, alcohol, and juice. The items listed above may not be a complete list of foods and beverages you should avoid. Contact a dietitian for more information. How do I count calories when eating out? Pay attention to portions. Often, portions are much larger when eating out. Try these tips to keep portions smaller: Consider sharing a meal instead of getting your own. If you get your own meal, eat only half of it. Before you start eating, ask for a container and put half of your meal into it. When available, consider ordering smaller portions from the menu instead of full portions. Pay attention to your food and drink choices. Knowing the way food is cooked and what is included with the meal can help you eat fewer calories. If calories are listed on the menu, choose the lower-calorie options. Choose dishes that include vegetables, fruits, whole grains, low-fat dairy products, and lean proteins. Choose items that are boiled, broiled, grilled, or steamed. Avoid items that are buttered, battered, fried, or served with cream sauce. Items labeled as crispy are usually fried, unless stated otherwise. Choose water, low-fat milk,  unsweetened iced tea, or other drinks without added sugar. If you want an alcoholic beverage, choose a lower-calorie option, such as a glass of wine or light beer. Ask for dressings, sauces, and syrups on the side. These are usually high in calories, so you should limit the amount you eat. If you want a salad, choose a garden salad and ask for grilled meats. Avoid extra toppings such as bacon, cheese, or fried items. Ask for the dressing on the side, or ask for olive oil and vinegar or lemon to use as dressing. Estimate how many servings of a food you are given. Knowing serving sizes will help you be aware of how much food you are eating at restaurants. Where to find more information Centers for Disease Control and Prevention: www.cdc.gov [place] Department of Agriculture: myplate.gov Summary Calorie counting means keeping track of how many calories you eat and drink each day. If you eat fewer calories than your body needs, you should lose weight. A healthy amount of weight to lose per week is usually 1-2 lb (0.5-0.9 kg). This usually means reducing your daily calorie intake by 500-750 calories. The number of calories in a food can be found on a Nutrition Facts label. If a food does not have a Nutrition Facts label, try to look up the calories online or ask your dietitian for help. Use smaller plates, glasses, and bowls for smaller portions and to prevent overeating. Use your calories on foods and drinks that will fill you up and not leave you hungry shortly after a meal. This information is not intended to replace advice given to you by your health care provider. Make sure you discuss any questions you have with your health care provider. Document Revised: 12/07/2019 Document Reviewed: 12/07/2019 [employer] Patient Education  2023 [employer]  

## 2023-10-13 NOTE — Assessment & Plan Note (Signed)
Status post robotic assisted prostate surgery last November2023 Follows up with urologist on a regular basis Still taking finasteride 5 mg daily

## 2023-10-13 NOTE — Progress Notes (Signed)
[name] [name] 62 y.o.   Chief Complaint  Patient presents with   Follow-up    6 month f/u for weight. Patient states wegovy is getting expensive and seems to not be working for him. He will probably wants to stop it. Wants to talk about getting back on metformin     HISTORY OF PRESENT ILLNESS: This is a 62 y.o. male complaining of weight loss issues.  Unable to lose weight Eating better.  Has been on metformin before.  Wants to get back on it.  Has been on Wegovy on and off.  Dealing with side effects. Not exercising as frequently. Wt Readings from Last 3 Encounters:  10/13/23 276 lb (125.2 kg)  05/22/23 277 lb (125.6 kg)  05/04/23 275 lb (124.7 kg)     HPI   Prior to Admission medications   Medication Sig Start Date End Date Taking? Authorizing Provider  Cholecalciferol (VITAMIN D3 PO) Take 1 capsule by mouth daily.   Yes [provider]  finasteride (PROSCAR) 5 MG tablet Take 5 mg by mouth every morning.   Yes [provider]  Multiple Vitamins-Minerals (EMERGEN-C IMMUNE PO) Take 1 tablet by mouth. With water   Yes [provider]  Semaglutide-Weight Management (WEGOVY) 1 MG/0.5ML SOAJ Inject 1 mg into the skin once a week. 12/22/22  Yes Elmina Hendel, Eilleen Kempf, MD  benzonatate (TESSALON) 100 MG capsule Take 1 capsule (100 mg total) by mouth every 8 (eight) hours. Patient not taking: Reported on 10/13/2023 05/22/23   Mannie Stabile, PA-C    No Known Allergies  Patient Active Problem List   Diagnosis Date Noted   History of colonic polyps 05/04/2023   Benign neoplasm of ascending colon 05/04/2023   Benign neoplasm of transverse colon 05/04/2023   Benign neoplasm of cecum 05/04/2023   BPH with obstruction/lower urinary tract symptoms 10/07/2022   Prediabetes 03/31/2022   Chronic pain of right knee 03/31/2022   Other hyperlipidemia 04/01/2021   Vitamin D deficiency 04/01/2021   Tobacco abuse 12/07/2018   Multinodular goiter 09/21/2012    Cigarette smoker 08/19/2010   BENIGN PROSTATIC HYPERTROPHY, HX OF 08/19/2010   HYPERCHOLESTEROLEMIA 04/25/2009   Morbid obesity (HCC) 09/06/2007   OBSTRUCTIVE SLEEP APNEA 09/06/2007    Past Medical History:  Diagnosis Date   Arthritis    Back pain    BPH (benign prostatic hyperplasia)    Cataract    Difficult intubation    Epididymitis    Hyperlipidemia    Joint pain    Morbid obesity (HCC)    OSA (obstructive sleep apnea)    Pneumonia    Pre-diabetes    Sleep apnea    Upper airway cough syndrome     Past Surgical History:  Procedure Laterality Date   CATARACT EXTRACTION W/ INTRAOCULAR LENS IMPLANT Bilateral    COLONOSCOPY WITH PROPOFOL N/A 05/04/2023   Procedure: COLONOSCOPY WITH PROPOFOL;  Surgeon: Hilarie Fredrickson, MD;  Location: [facility];  Service: Gastroenterology;  Laterality: N/A;   POLYPECTOMY  05/04/2023   Procedure: POLYPECTOMY;  Surgeon: Hilarie Fredrickson, MD;  Location: [facility];  Service: Gastroenterology;;   TESTICLE REMOVAL  age 68   WISDOM TOOTH EXTRACTION     XI ROBOTIC ASSISTED SIMPLE PROSTATECTOMY N/A 10/07/2022   Procedure: XI ROBOTIC ASSISTED SIMPLE PROSTATECTOMY;  Surgeon: Sebastian Ache, MD;  Location: [facility];  Service: Urology;  Laterality: N/A;  3 HRS    Social History   Socioeconomic History   Marital status: Married    Spouse  name: Not on file   Number of children: Not on file   Years of education: Not on file   Highest education level: Bachelor's degree (e.g., BA, AB, BS)  Occupational History   Occupation: Teacher, music: [employer]  Tobacco Use   Smoking status: Every Day    Current packs/day: 0.50    Average packs/day: 0.5 packs/day for 64.9 years (32.5 ttl pk-yrs)    Types: Cigarettes    Start date: 1985   Smokeless tobacco: Never   Tobacco comments:    1/2 pack per day 08/26/21  Vaping Use   Vaping status: Never Used  Substance and Sexual Activity   Alcohol use: Yes    Alcohol/week: 3.0 - 4.0  standard drinks of alcohol    Types: 3 - 4 Standard drinks or equivalent per week   Drug use: No   Sexual activity: Never  Other Topics Concern   Not on file  Social History Narrative   Not on file   Social Determinants of Health   Financial Resource Strain: Low Risk  (10/09/2023)   Overall Financial Resource Strain (CARDIA)    Difficulty of Paying Living Expenses: Not hard at all  Food Insecurity: No Food Insecurity (10/09/2023)   Hunger Vital Sign    Worried About Running Out of Food in the Last Year: Never true    Ran Out of Food in the Last Year: Never true  Transportation Needs: No Transportation Needs (10/09/2023)   PRAPARE - Administrator, Civil Service (Medical): No    Lack of Transportation (Non-Medical): No  Physical Activity: Insufficiently Active (10/09/2023)   Exercise Vital Sign    Days of Exercise per Week: 1 day    Minutes of Exercise per Session: 30 min  Stress: Stress Concern Present (10/09/2023)   [employer] of Occupational Health - Occupational Stress Questionnaire    Feeling of Stress : To some extent  Social Connections: Socially Integrated (10/09/2023)   Social Connection and Isolation Panel [NHANES]    Frequency of Communication with Friends and Family: Twice a week    Frequency of Social Gatherings with Friends and Family: Once a week    Attends Religious Services: More than 4 times per year    Active Member of [employer] or Organizations: Yes    Attends Engineer, structural: More than 4 times per year    Marital Status: Married  Catering manager Violence: Not At Risk (10/07/2022)   Humiliation, Afraid, Rape, and Kick questionnaire    Fear of Current or Ex-Partner: No    Emotionally Abused: No    Physically Abused: No    Sexually Abused: No    Family History  Problem Relation Age of Onset   Coronary artery disease Mother 64   Diabetes Mother    Heart disease Mother    Hyperlipidemia Mother    Heart failure Mother     Diabetes Father    Heart disease Father    Diabetes Brother        older brother   Cancer Neg Hx    Colon cancer Neg Hx    Esophageal cancer Neg Hx    Rectal cancer Neg Hx    Stomach cancer Neg Hx      Review of Systems  Constitutional: Negative.  Negative for chills and fever.  HENT: Negative.  Negative for congestion and sore throat.   Respiratory: Negative.  Negative for cough and shortness of breath.  Cardiovascular: Negative.  Negative for chest pain and palpitations.  Gastrointestinal:  Negative for abdominal pain, diarrhea, nausea and vomiting.  Genitourinary: Negative.  Negative for dysuria and hematuria.  Skin: Negative.  Negative for rash.  Neurological: Negative.  Negative for dizziness and headaches.  All other systems reviewed and are negative.   Vitals:   10/13/23 0845  BP: 128/80  Pulse: 74  Temp: 98.3 F (36.8 C)  SpO2: 97%    Physical Exam Vitals reviewed.  Constitutional:      Appearance: Normal appearance. He is obese.  HENT:     Head: Normocephalic.  Eyes:     Extraocular Movements: Extraocular movements intact.  Cardiovascular:     Rate and Rhythm: Normal rate.  Pulmonary:     Effort: Pulmonary effort is normal.  Skin:    General: Skin is warm and dry.  Neurological:     Mental Status: He is alert and oriented to person, place, and time.  Psychiatric:        Mood and Affect: Mood normal.        Behavior: Behavior normal.      ASSESSMENT & PLAN: A total of 45 minutes was spent with the patient and counseling/coordination of care regarding preparing for this visit, review of most recent office visit notes, review of multiple chronic medical conditions and their management, review of all medications, review of most recent bloodwork results, review of health maintenance items, education on nutrition, prognosis, documentation, and need for follow up.   Problem List Items Addressed This Visit       Respiratory   OBSTRUCTIVE SLEEP APNEA     Stable.  On CPAP treatment.        Genitourinary   BPH with obstruction/lower urinary tract symptoms    Status post robotic assisted prostate surgery last November2023 Follows up with urologist on a regular basis Still taking finasteride 5 mg daily        Other   Morbid obesity (HCC) - Primary    Still struggling with weight loss despite eating better Advised to decrease amount of daily carbohydrate intake and daily calories and increase amount of plant based protein in his diet Wants to restart metformin.  New prescription sent to pharmacy of record today Wants to continue weekly Wegovy.  New prescription sent Benefits of exercise discussed Not interested in weight management clinic      Relevant Medications   Semaglutide-Weight Management (WEGOVY) 1 MG/0.5ML SOAJ   metFORMIN (GLUCOPHAGE) 1000 MG tablet   Prediabetes    Diet and nutrition discussed Continue semaglutide 1 mg weekly      Patient Instructions  Calorie Counting for Weight Loss Calories are units of energy. Your body needs a certain number of calories from food to keep going throughout the day. When you eat or drink more calories than your body needs, your body stores the extra calories mostly as fat. When you eat or drink fewer calories than your body needs, your body burns fat to get the energy it needs. Calorie counting means keeping track of how many calories you eat and drink each day. Calorie counting can be helpful if you need to lose weight. If you eat fewer calories than your body needs, you should lose weight. Ask your health care provider what a healthy weight is for you. For calorie counting to work, you will need to eat the right number of calories each day to lose a healthy amount of weight per week. A dietitian can help you  figure out how many calories you need in a day and will suggest ways to reach your calorie goal. A healthy amount of weight to lose each week is usually 1-2 lb (0.5-0.9 kg). This  usually means that your daily calorie intake should be reduced by 500-750 calories. Eating 1,200-1,500 calories a day can help most women lose weight. Eating 1,500-1,800 calories a day can help most men lose weight. What do I need to know about calorie counting? Work with your health care provider or dietitian to determine how many calories you should get each day. To meet your daily calorie goal, you will need to: Find out how many calories are in each food that you would like to eat. Try to do this before you eat. Decide how much of the food you plan to eat. Keep a food log. Do this by writing down what you ate and how many calories it had. To successfully lose weight, it is important to balance calorie counting with a healthy lifestyle that includes regular activity. Where do I find calorie information?  The number of calories in a food can be found on a Nutrition Facts label. If a food does not have a Nutrition Facts label, try to look up the calories online or ask your dietitian for help. Remember that calories are listed per serving. If you choose to have more than one serving of a food, you will have to multiply the calories per serving by the number of servings you plan to eat. For example, the label on a package of bread might say that a serving size is 1 slice and that there are 90 calories in a serving. If you eat 1 slice, you will have eaten 90 calories. If you eat 2 slices, you will have eaten 180 calories. How do I keep a food log? After each time that you eat, record the following in your food log as soon as possible: What you ate. Be sure to include toppings, sauces, and other extras on the food. How much you ate. This can be measured in cups, ounces, or number of items. How many calories were in each food and drink. The total number of calories in the food you ate. Keep your food log near you, such as in a pocket-sized notebook or on an app or website on your mobile phone. Some  programs will calculate calories for you and show you how many calories you have left to meet your daily goal. What are some portion-control tips? Know how many calories are in a serving. This will help you know how many servings you can have of a certain food. Use a measuring cup to measure serving sizes. You could also try weighing out portions on a kitchen scale. With time, you will be able to estimate serving sizes for some foods. Take time to put servings of different foods on your favorite plates or in your favorite bowls and cups so you know what a serving looks like. Try not to eat straight from a food's packaging, such as from a bag or box. Eating straight from the package makes it hard to see how much you are eating and can lead to overeating. Put the amount you would like to eat in a cup or on a plate to make sure you are eating the right portion. Use smaller plates, glasses, and bowls for smaller portions and to prevent overeating. Try not to multitask. For example, avoid watching TV or using your computer while eating.  If it is time to eat, sit down at a table and enjoy your food. This will help you recognize when you are full. It will also help you be more mindful of what and how much you are eating. What are tips for following this plan? Reading food labels Check the calorie count compared with the serving size. The serving size may be smaller than what you are used to eating. Check the source of the calories. Try to choose foods that are high in protein, fiber, and vitamins, and low in saturated fat, trans fat, and sodium. Shopping Read nutrition labels while you shop. This will help you make healthy decisions about which foods to buy. Pay attention to nutrition labels for low-fat or fat-free foods. These foods sometimes have the same number of calories or more calories than the full-fat versions. They also often have added sugar, starch, or salt to make up for flavor that was removed  with the fat. Make a grocery list of lower-calorie foods and stick to it. Cooking Try to cook your favorite foods in a healthier way. For example, try baking instead of frying. Use low-fat dairy products. Meal planning Use more fruits and vegetables. One-half of your plate should be fruits and vegetables. Include lean proteins, such as chicken, [place], and fish. Lifestyle Each week, aim to do one of the following: 150 minutes of moderate exercise, such as walking. 75 minutes of vigorous exercise, such as running. General information Know how many calories are in the foods you eat most often. This will help you calculate calorie counts faster. Find a way of tracking calories that works for you. Get creative. Try different apps or programs if writing down calories does not work for you. What foods should I eat?  Eat nutritious foods. It is better to have a nutritious, high-calorie food, such as an avocado, than a food with few nutrients, such as a bag of potato chips. Use your calories on foods and drinks that will fill you up and will not leave you hungry soon after eating. Examples of foods that fill you up are nuts and nut butters, vegetables, lean proteins, and high-fiber foods such as whole grains. High-fiber foods are foods with more than 5 g of fiber per serving. Pay attention to calories in drinks. Low-calorie drinks include water and unsweetened drinks. The items listed above may not be a complete list of foods and beverages you can eat. Contact a dietitian for more information. What foods should I limit? Limit foods or drinks that are not good sources of vitamins, minerals, or protein or that are high in unhealthy fats. These include: Candy. Other sweets. Sodas, specialty coffee drinks, alcohol, and juice. The items listed above may not be a complete list of foods and beverages you should avoid. Contact a dietitian for more information. How do I count calories when eating  out? Pay attention to portions. Often, portions are much larger when eating out. Try these tips to keep portions smaller: Consider sharing a meal instead of getting your own. If you get your own meal, eat only half of it. Before you start eating, ask for a container and put half of your meal into it. When available, consider ordering smaller portions from the menu instead of full portions. Pay attention to your food and drink choices. Knowing the way food is cooked and what is included with the meal can help you eat fewer calories. If calories are listed on the menu, choose the lower-calorie options.  Choose dishes that include vegetables, fruits, whole grains, low-fat dairy products, and lean proteins. Choose items that are boiled, broiled, grilled, or steamed. Avoid items that are buttered, battered, fried, or served with cream sauce. Items labeled as crispy are usually fried, unless stated otherwise. Choose water, low-fat milk, unsweetened iced tea, or other drinks without added sugar. If you want an alcoholic beverage, choose a lower-calorie option, such as a glass of wine or light beer. Ask for dressings, sauces, and syrups on the side. These are usually high in calories, so you should limit the amount you eat. If you want a salad, choose a garden salad and ask for grilled meats. Avoid extra toppings such as bacon, cheese, or fried items. Ask for the dressing on the side, or ask for olive oil and vinegar or lemon to use as dressing. Estimate how many servings of a food you are given. Knowing serving sizes will help you be aware of how much food you are eating at restaurants. Where to find more information Centers for Disease Control and Prevention: FootballExhibition.com.br U.S. Department of Agriculture: WrestlingReporter.dk Summary Calorie counting means keeping track of how many calories you eat and drink each day. If you eat fewer calories than your body needs, you should lose weight. A healthy amount of weight  to lose per week is usually 1-2 lb (0.5-0.9 kg). This usually means reducing your daily calorie intake by 500-750 calories. The number of calories in a food can be found on a Nutrition Facts label. If a food does not have a Nutrition Facts label, try to look up the calories online or ask your dietitian for help. Use smaller plates, glasses, and bowls for smaller portions and to prevent overeating. Use your calories on foods and drinks that will fill you up and not leave you hungry shortly after a meal. This information is not intended to replace advice given to you by your health care provider. Make sure you discuss any questions you have with your health care provider. Document Revised: 12/07/2019 Document Reviewed: 12/07/2019 Elsevier Patient Education  2023 Elsevier Inc.    Edwina Barth, MD [facility]

## 2023-10-13 NOTE — Assessment & Plan Note (Signed)
Still struggling with weight loss despite eating better Advised to decrease amount of daily carbohydrate intake and daily calories and increase amount of plant based protein in his diet Wants to restart metformin.  New prescription sent to pharmacy of record today Wants to continue weekly Wegovy.  New prescription sent Benefits of exercise discussed Not interested in weight management clinic

## 2023-11-19 ENCOUNTER — Ambulatory Visit: Payer: Self-pay | Admitting: Family Medicine

## 2023-11-26 IMAGING — DX DG KNEE COMPLETE 4+V*R*
4 series · 4 of 4 positions shown · non-contrast
Comparison: None Available.

CLINICAL DATA: Medial right knee pain for 2-3 weeks.

EXAM:
RIGHT KNEE - COMPLETE 4+ VIEW

[knee ap]
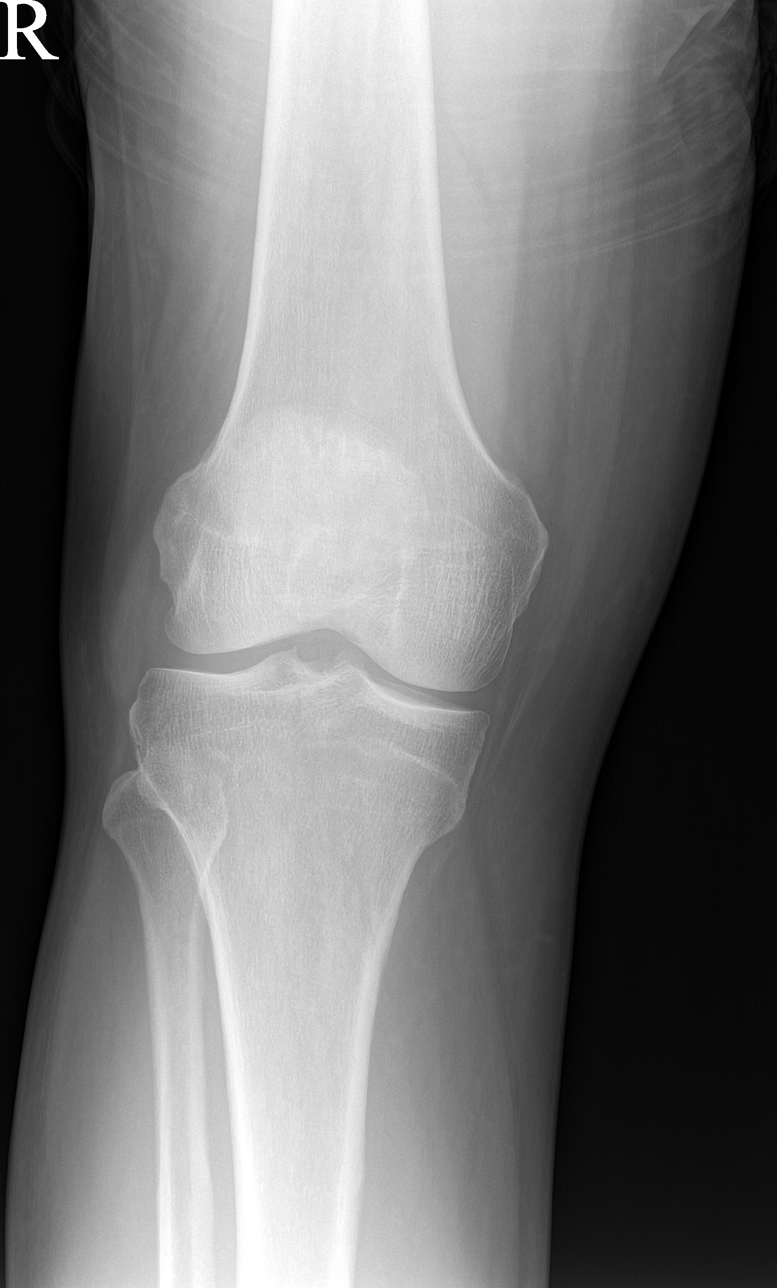

[knee obl (1 of 2)]
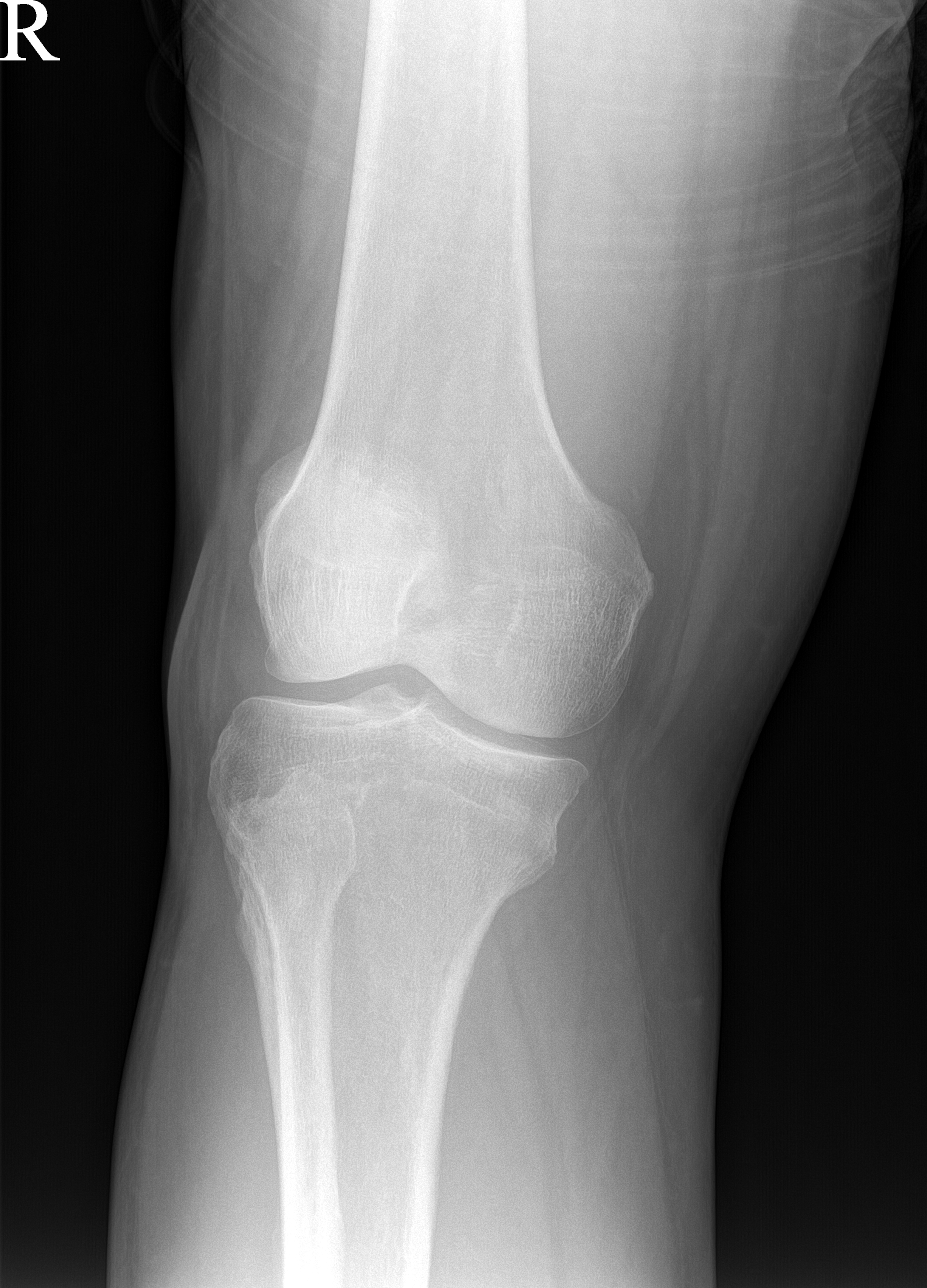

[knee obl (2 of 2)]
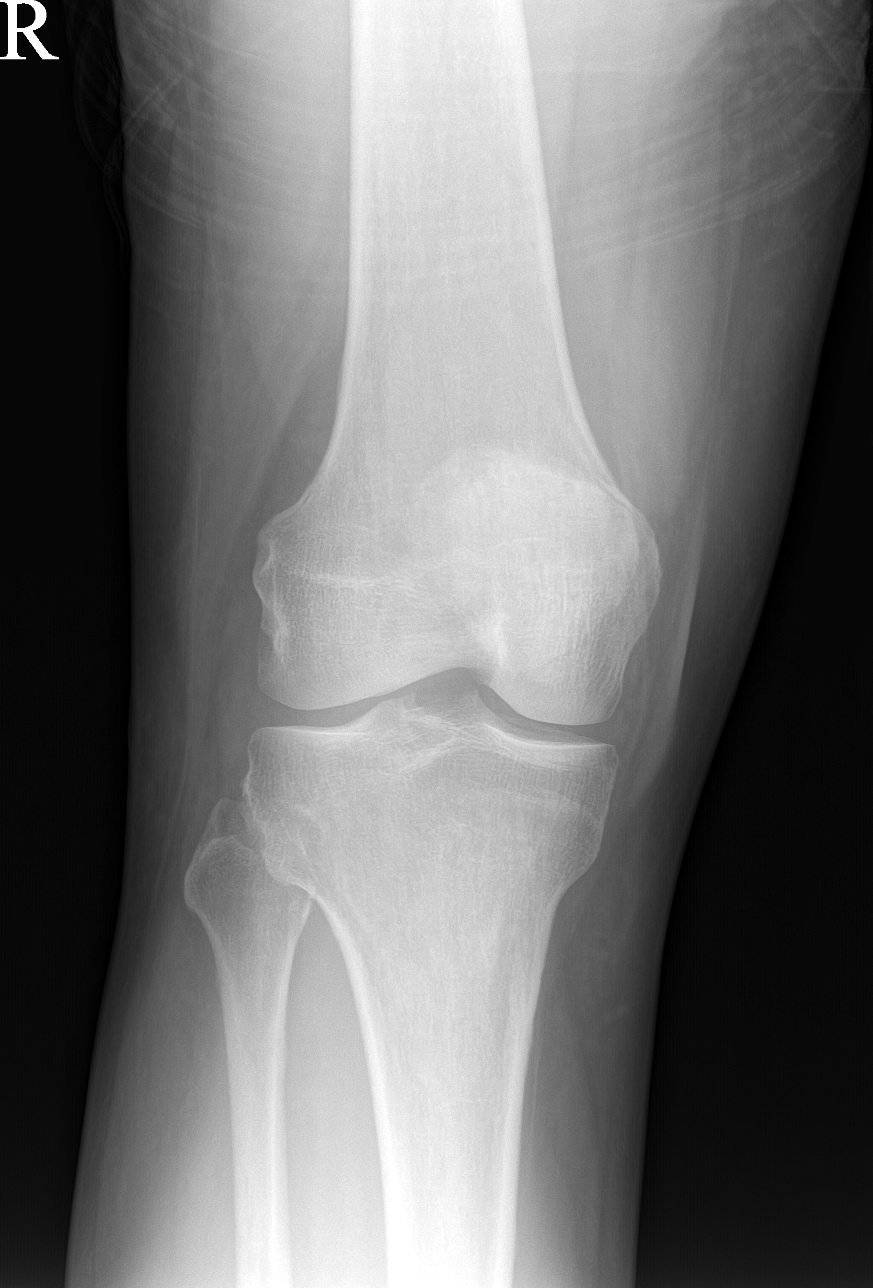

[knee lat]
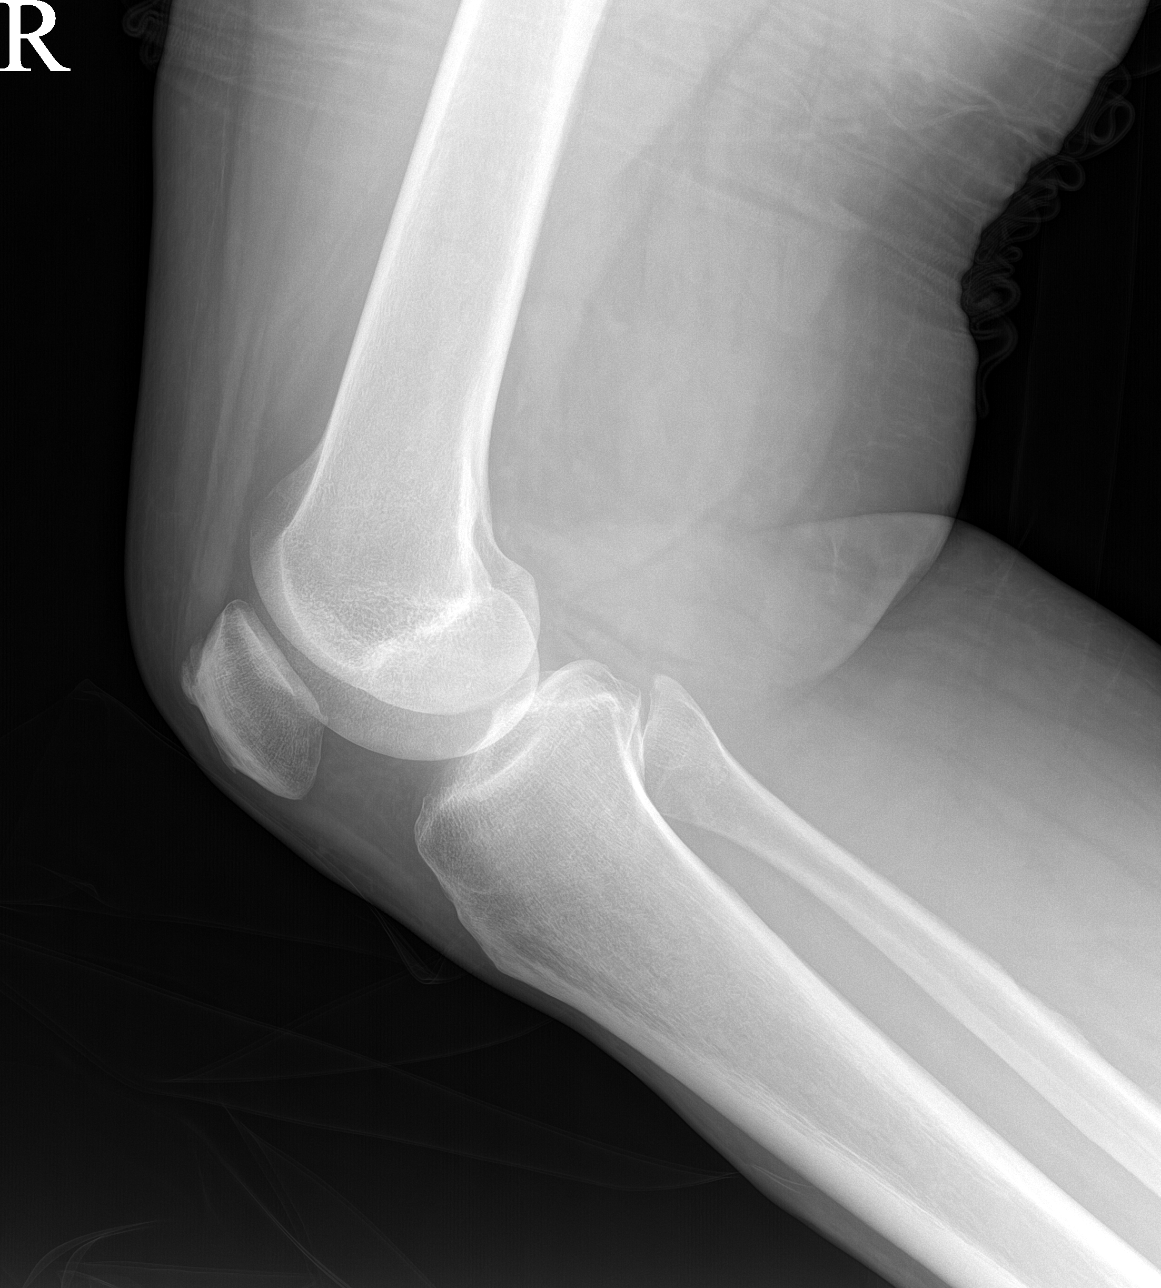

[4 of 4 positions shown; findings below may reference images not displayed]

FINDINGS: Mild medial compartment joint space narrowing. No joint effusion.
Minimal chronic enthesopathic change at the quadriceps insertion on
the patella. No acute fracture or dislocation.
IMPRESSION: Minimal medial compartment osteoarthritis.

## 2023-12-04 IMAGING — DX DG CHEST 2V
2 series · 2 of 2 positions shown · non-contrast
Comparison: 02/27/2022, 12/16/2018

CLINICAL DATA: Cough, short of breath, hypoxia

EXAM:
CHEST - 2 VIEW

[chest pa]
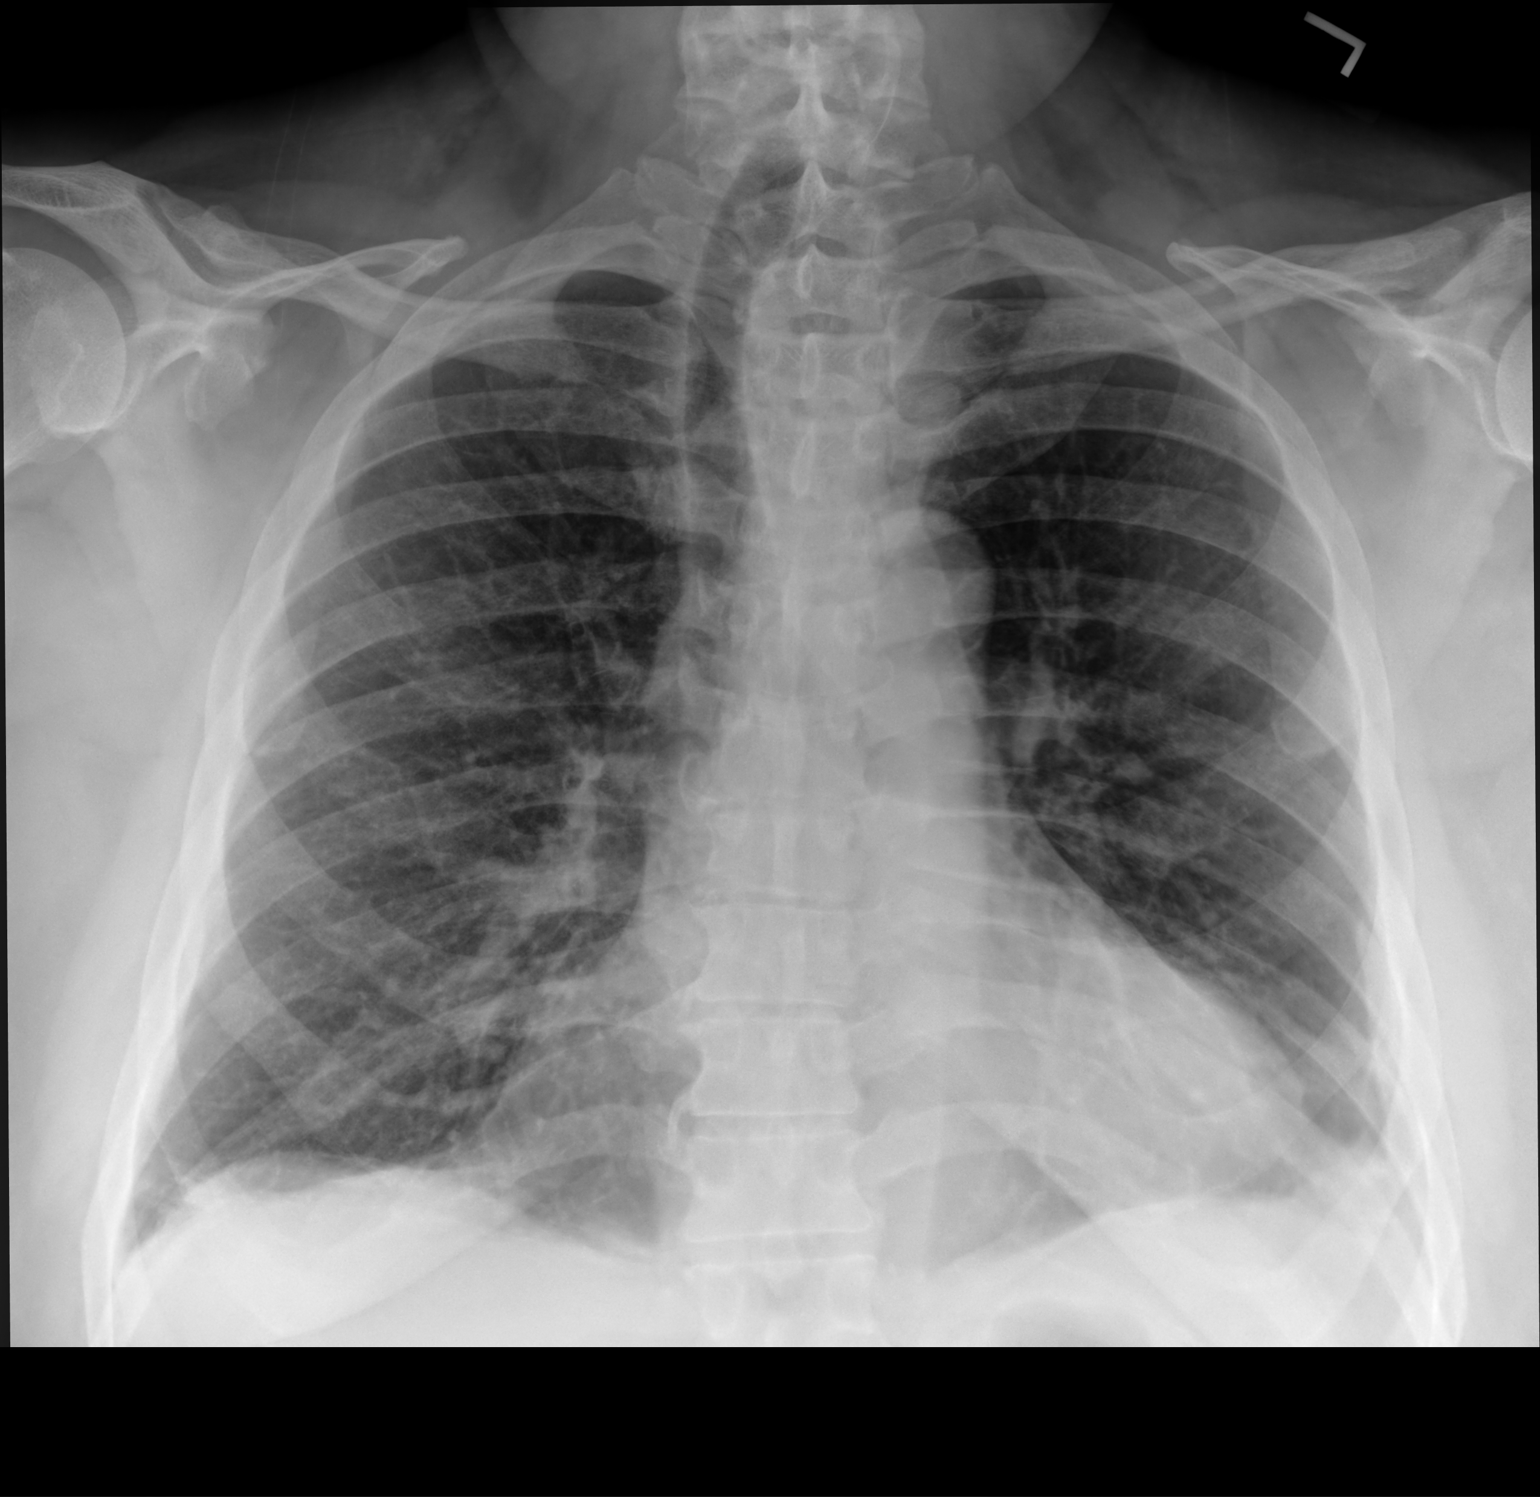

[chest lat]
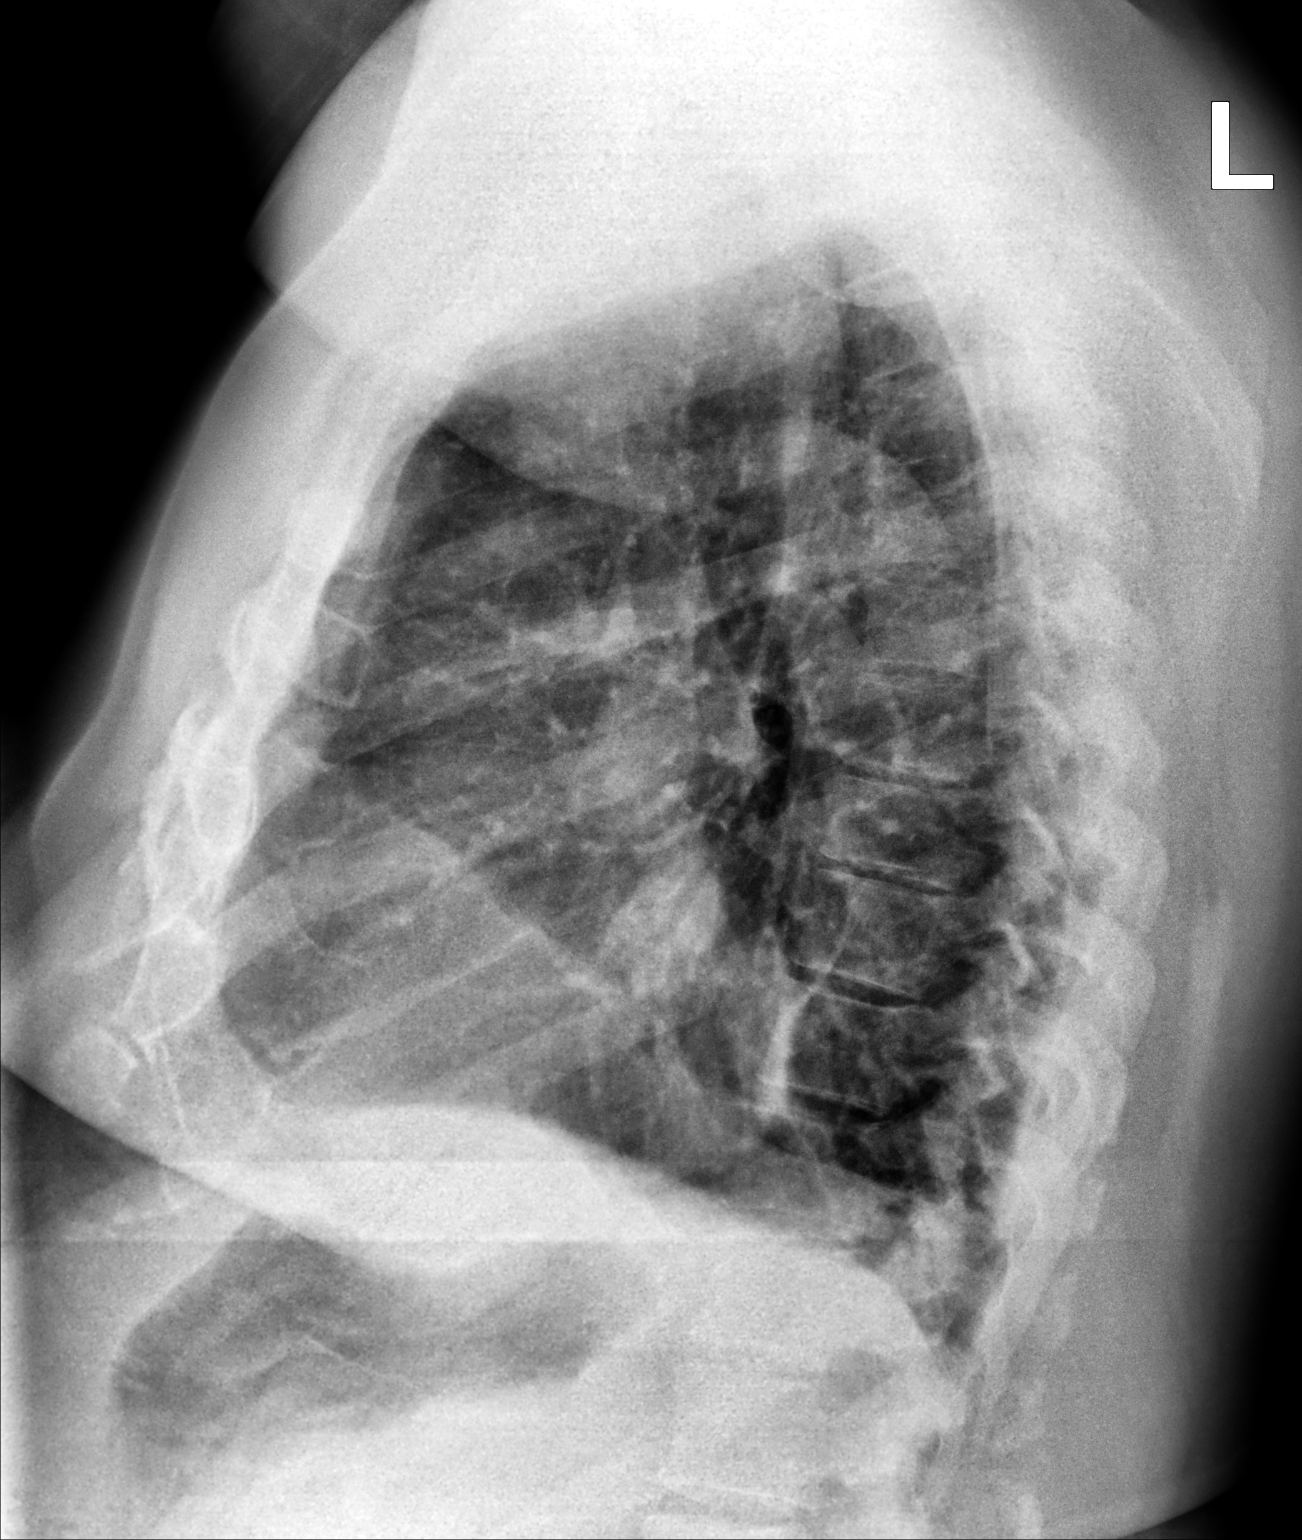

[2 of 2 positions shown; findings below may reference images not displayed]

FINDINGS: Frontal and lateral views of the chest demonstrate a stable cardiac
silhouette. Stable linear opacities are seen at the lung bases, left
greater than right. There is blunting of the left costophrenic
angle, unchanged since 3636 and most consistent with pleural
thickening. No acute airspace disease, effusion, or pneumothorax. No
acute bony abnormalities.
IMPRESSION: 1. Chronic left lower lobe consolidation and blunting of the left
costophrenic angle, unchanged since 3636 and most consistent with
scarring and pleural thickening.
2. Persistent linear consolidation at the right lung base, unchanged
since 02/27/2022, which could reflect persistent airspace disease,
atelectasis, or scarring.
3. No new process.

## 2023-12-10 ENCOUNTER — Encounter: Payer: Self-pay | Admitting: Family Medicine

## 2023-12-10 ENCOUNTER — Ambulatory Visit (INDEPENDENT_AMBULATORY_CARE_PROVIDER_SITE_OTHER): Payer: 59 | Admitting: Family Medicine

## 2023-12-10 VITALS — BP 122/72 | HR 67 | Temp 97.8°F | Ht 71.0 in | Wt 285.0 lb

## 2023-12-10 DIAGNOSIS — J069 Acute upper respiratory infection, unspecified: Secondary | ICD-10-CM

## 2023-12-10 DIAGNOSIS — J3489 Other specified disorders of nose and nasal sinuses: Secondary | ICD-10-CM | POA: Diagnosis not present

## 2023-12-10 DIAGNOSIS — R0981 Nasal congestion: Secondary | ICD-10-CM | POA: Diagnosis not present

## 2023-12-10 DIAGNOSIS — R051 Acute cough: Secondary | ICD-10-CM | POA: Diagnosis not present

## 2023-12-10 LAB — POCT INFLUENZA A/B
Influenza A, POC: NEGATIVE
Influenza B, POC: NEGATIVE

## 2023-12-10 LAB — POC COVID19 BINAXNOW: SARS Coronavirus 2 Ag: NEGATIVE

## 2023-12-10 NOTE — Progress Notes (Signed)
Subjective:  [name] [name] is a 63 y.o. male who presents for a 5-day history of fatigue, nasal congestion, sinus pressure and mild cough.  States mainly nasal drainage is clear but he did have some yellowish drainage yesterday.  This morning his drainage was clear again.  He is taking Zyrtec and Mucinex.  Denies fever, chills, dizziness, ear pain, sore throat, chest pain, palpitations, shortness of breath, abdominal pain, N/V/D.    ROS as in subjective.   Objective: Vitals:   12/10/23 1012  BP: 122/72  Pulse: 67  Temp: 97.8 F (36.6 C)  SpO2: 96%    General appearance: Alert, WD/WN, no distress                             Skin: warm, no rash                           Head: no sinus tenderness                            Eyes: conjunctiva normal, corneas clear, PERRLA                            Ears: pearly TMs, external ear canals normal                          Nose: septum midline, turbinates swollen, with erythema and clear discharge             Mouth/throat: MMM, tongue normal, mild pharyngeal erythema                           Neck: supple, no adenopathy, no thyromegaly, nontender                          Heart: RRR                         Lungs: CTA bilaterally, no wheezes, rales, or rhonchi      Assessment: Viral URI with cough  Acute cough - Plan: POC COVID-19 BinaxNow, POCT Influenza A/B  Nasal congestion - Plan: POC COVID-19 BinaxNow, POCT Influenza A/B  Sinus pressure   Plan: Negative COVID and flu tests. Discussed diagnosis and treatment of URI.  Suggested symptomatic OTC remedies. Nasal saline spray or Flonase for congestion.  Tylenol or Ibuprofen OTC as needed. Call/return for worsening symptoms or if not improving in the next 5 to 7 days.

## 2023-12-10 NOTE — Patient Instructions (Signed)
You are negative for COVID and flu today.  You do appear to have a viral illness.  Typically viral illnesses take 7 to 10 days to run their course.   At this point, continue treating your symptoms.  Zyrtec, Mucinex, Tylenol, salt water gargles, Flonase nasal spray, staying hydrated are all ways to help your symptoms.  If you develop any new or worsening symptoms, let me know or go to urgent care if it is over the weekend.  Concerning symptoms would be a high fever, severe headache or facial pain, worsening productive cough, wheezing, shortness of breath.

## 2024-02-04 NOTE — Progress Notes (Signed)
 [name] Payor, PhD, LAT, ATC acting as a scribe for [name] Graham, MD.  [name] EMBERTON is a 63 y.o. male who presents to [facility] at [facility] today for bilat hip and R knee pain. Pt was last seen by Dr. Denyse Amass on 11/06/21 and was given a L GT steroid injection.  Today, pt reports R knee pain returned back in January. He has been working on [employer], since the knee pain has returned, 3-4 x wk. Pain worsened over the last week. Yes, "knot" laterally.  He also c/o bilat hip pain, mostly the L. He has been working on weight loss activity, recently re-started metformin, but this didn't agree w/ him. He locates pain to the lateral aspect of both hips  Dx imaging: 03/31/22 R knee XR  11/06/21 R hip & L-spine XR  Pertinent review of systems: No fevers or chills  Relevant historical information: Obesity diabetes.  Patient notes that previously his obesity was better controlled on Wegovy.  He is currently taking metformin and cannot tolerate it.  He does have a history of diabetes with an A1c in the past of 6.7.   Exam:  BP 116/72   Pulse 75   Ht 5\' 11"  (1.803 m)   Wt 285 lb (129.3 kg)   SpO2 96%   BMI 39.75 kg/m  General: Well Developed, well nourished, and in no acute distress.   MSK: Right knee swelling at lateral joint line.  Tender palpation lateral joint line. Normal motion with crepitation. Intact strength.  Left hip normal-appearing Tender palpation greater trochanter.  Mild reduced strength hip adduction.    Lab and Radiology Results  Procedure: Real-time Ultrasound Guided Injection of right knee joint superior lateral patella space Device: Philips Affiniti 50G/GE Logiq Images permanently stored and available for review in PACS Evaluation prior to injection shows a parameniscal cyst lateral meniscus. Verbal informed consent obtained.  Discussed risks and benefits of procedure. Warned about infection, bleeding, hyperglycemia damage to structures among  others. Patient expresses understanding and agreement Time-out conducted.   Noted no overlying erythema, induration, or other signs of local infection.   Skin prepped in a sterile fashion.   Local anesthesia: Topical Ethyl chloride.   With sterile technique and under real time ultrasound guidance: 40 mg of Kenalog and 2 mL of Marcaine injected into knee joint. Fluid seen entering the joint capsule.   Completed without difficulty   Pain immediately resolved suggesting accurate placement of the medication.   Advised to call if fevers/chills, erythema, induration, drainage, or persistent bleeding.   Images permanently stored and available for review in the ultrasound unit.  Impression: Technically successful ultrasound guided injection.   X-ray images right knee obtained today personally and independently interpreted. Mild DJD.  No acute fractures. Await formal radiology review     Assessment and Plan: 63 y.o. male with right knee pain and swelling.  This occurs in the setting of a parameniscal cyst lateral meniscus.  Plan for conventional interarticular cortisone injection.  If this does not work well could proceed with aspiration and injection of the parameniscal cyst.  If still needed in the future consider MRI.   PDMP not reviewed this encounter. Orders Placed This Encounter  Procedures   [place] LIMITED JOINT SPACE STRUCTURES LOW RIGHT(NO LINKED CHARGES)    Reason for Exam (SYMPTOM  OR DIAGNOSIS REQUIRED):   right knee pain    Preferred imaging location?:   [facility] Knee AP/LAT W/Sunrise Right  Standing Status:   Future    Number of Occurrences:   1    Expiration Date:   03/08/2024    Reason for Exam (SYMPTOM  OR DIAGNOSIS REQUIRED):   right knee pain    Preferred imaging location?:   [facility]   No orders of the defined types were placed in this encounter.    Discussed warning signs or symptoms. Please see discharge instructions.  Patient expresses understanding.   The above documentation has been reviewed and is accurate and complete [name] [name], M.D.

## 2024-02-07 ENCOUNTER — Ambulatory Visit (INDEPENDENT_AMBULATORY_CARE_PROVIDER_SITE_OTHER)

## 2024-02-07 ENCOUNTER — Ambulatory Visit: Admitting: Family Medicine

## 2024-02-07 ENCOUNTER — Other Ambulatory Visit: Payer: Self-pay

## 2024-02-07 VITALS — BP 116/72 | HR 75 | Ht 71.0 in | Wt 285.0 lb

## 2024-02-07 DIAGNOSIS — M25561 Pain in right knee: Secondary | ICD-10-CM

## 2024-02-07 DIAGNOSIS — G8929 Other chronic pain: Secondary | ICD-10-CM

## 2024-02-07 DIAGNOSIS — M23006 Cystic meniscus, unspecified meniscus, right knee: Secondary | ICD-10-CM

## 2024-02-07 NOTE — Patient Instructions (Addendum)
 Thank you for coming in today.   Please work on the home exercises the athletic trainer went over with you:  View at [email or web address] using code [email or web address]  Please get an Xray today before you leave   You received an injection today. Seek immediate medical attention if the joint becomes red, extremely painful, or is oozing fluid.   Could consider Ozempic  Check back in 6 weeks

## 2024-02-14 ENCOUNTER — Encounter: Payer: Self-pay | Admitting: Family Medicine

## 2024-02-14 NOTE — Progress Notes (Signed)
 Right knee x-ray shows mild arthritis.

## 2024-03-20 ENCOUNTER — Ambulatory Visit: Admitting: Family Medicine

## 2024-04-04 ENCOUNTER — Ambulatory Visit: Payer: Self-pay | Admitting: Emergency Medicine

## 2024-05-02 ENCOUNTER — Encounter: Admitting: Emergency Medicine

## 2024-05-02 ENCOUNTER — Ambulatory Visit (INDEPENDENT_AMBULATORY_CARE_PROVIDER_SITE_OTHER): Admitting: Emergency Medicine

## 2024-05-02 ENCOUNTER — Ambulatory Visit: Payer: Self-pay | Admitting: Emergency Medicine

## 2024-05-02 VITALS — BP 128/72 | HR 70 | Temp 98.3°F | Ht 71.0 in | Wt 283.0 lb

## 2024-05-02 DIAGNOSIS — Z125 Encounter for screening for malignant neoplasm of prostate: Secondary | ICD-10-CM | POA: Diagnosis not present

## 2024-05-02 DIAGNOSIS — Z13220 Encounter for screening for lipoid disorders: Secondary | ICD-10-CM

## 2024-05-02 DIAGNOSIS — Z1329 Encounter for screening for other suspected endocrine disorder: Secondary | ICD-10-CM | POA: Diagnosis not present

## 2024-05-02 DIAGNOSIS — R7303 Prediabetes: Secondary | ICD-10-CM

## 2024-05-02 DIAGNOSIS — F17210 Nicotine dependence, cigarettes, uncomplicated: Secondary | ICD-10-CM | POA: Diagnosis not present

## 2024-05-02 DIAGNOSIS — Z0001 Encounter for general adult medical examination with abnormal findings: Secondary | ICD-10-CM

## 2024-05-02 DIAGNOSIS — Z13228 Encounter for screening for other metabolic disorders: Secondary | ICD-10-CM | POA: Diagnosis not present

## 2024-05-02 DIAGNOSIS — Z130 Encounter for screening for diseases of the blood and blood-forming organs and certain disorders involving the immune mechanism: Secondary | ICD-10-CM | POA: Diagnosis not present

## 2024-05-02 DIAGNOSIS — N401 Enlarged prostate with lower urinary tract symptoms: Secondary | ICD-10-CM

## 2024-05-02 DIAGNOSIS — Z0000 Encounter for general adult medical examination without abnormal findings: Secondary | ICD-10-CM

## 2024-05-02 DIAGNOSIS — N138 Other obstructive and reflux uropathy: Secondary | ICD-10-CM

## 2024-05-02 LAB — CBC WITH DIFFERENTIAL/PLATELET
Basophils Absolute: 0.1 10*3/uL (ref 0.0–0.1)
Basophils Relative: 0.5 % (ref 0.0–3.0)
Eosinophils Absolute: 0.2 10*3/uL (ref 0.0–0.7)
Eosinophils Relative: 1.8 % (ref 0.0–5.0)
HCT: 41.1 % (ref 39.0–52.0)
Hemoglobin: 13.5 g/dL (ref 13.0–17.0)
Lymphocytes Relative: 20.2 % (ref 12.0–46.0)
Lymphs Abs: 2 10*3/uL (ref 0.7–4.0)
MCHC: 32.8 g/dL (ref 30.0–36.0)
MCV: 87 fl (ref 78.0–100.0)
Monocytes Absolute: 0.8 10*3/uL (ref 0.1–1.0)
Monocytes Relative: 8 % (ref 3.0–12.0)
Neutro Abs: 6.9 10*3/uL (ref 1.4–7.7)
Neutrophils Relative %: 69.5 % (ref 43.0–77.0)
Platelets: 254 10*3/uL (ref 150.0–400.0)
RBC: 4.73 Mil/uL (ref 4.22–5.81)
RDW: 15.9 % — ABNORMAL HIGH (ref 11.5–15.5)
WBC: 9.9 10*3/uL (ref 4.0–10.5)

## 2024-05-02 LAB — TSH: TSH: 1.46 u[IU]/mL (ref 0.35–5.50)

## 2024-05-02 LAB — COMPREHENSIVE METABOLIC PANEL WITH GFR
ALT: 24 U/L (ref 0–53)
AST: 18 U/L (ref 0–37)
Albumin: 4 g/dL (ref 3.5–5.2)
Alkaline Phosphatase: 76 U/L (ref 39–117)
BUN: 12 mg/dL (ref 6–23)
CO2: 33 meq/L — ABNORMAL HIGH (ref 19–32)
Calcium: 9.4 mg/dL (ref 8.4–10.5)
Chloride: 104 meq/L (ref 96–112)
Creatinine, Ser: 0.88 mg/dL (ref 0.40–1.50)
GFR: 90.94 mL/min (ref >60.00)
Glucose, Bld: 92 mg/dL (ref 70–99)
Potassium: 4.4 meq/L (ref 3.5–5.1)
Sodium: 141 meq/L (ref 135–145)
Total Bilirubin: 0.5 mg/dL (ref 0.2–1.2)
Total Protein: 6.8 g/dL (ref 6.0–8.3)

## 2024-05-02 LAB — LIPID PANEL
Cholesterol: 192 mg/dL (ref 0–200)
HDL: 36.1 mg/dL — ABNORMAL LOW (ref >39.00)
LDL Cholesterol: 118 mg/dL — ABNORMAL HIGH (ref 0–99)
NonHDL: 155.75
Total CHOL/HDL Ratio: 5
Triglycerides: 187 mg/dL — ABNORMAL HIGH (ref 0.0–149.0)
VLDL: 37.4 mg/dL (ref 0.0–40.0)

## 2024-05-02 LAB — HEMOGLOBIN A1C: Hgb A1c MFr Bld: 6.1 % (ref 4.6–6.5)

## 2024-05-02 LAB — PSA: PSA: 0.81 ng/mL (ref 0.10–4.00)

## 2024-05-02 NOTE — Assessment & Plan Note (Signed)
 Still struggling with weight loss despite eating better Advised to decrease amount of daily carbohydrate intake and daily calories and increase amount of plant based protein in his diet Not taking metformin .  Tried Wegovy  in the past with good results but too expensive.  Advised to contact medical insurance company and inquire about Zepbound coverage. Benefits of exercise discussed Not interested in weight management clinic

## 2024-05-02 NOTE — Progress Notes (Signed)
 [name] [name] 63 y.o.   Chief Complaint  Patient presents with   Annual Exam    Patient here for physical. Patient states no other concerns other his weight    HISTORY OF PRESENT ILLNESS: This is a 63 y.o. male here for annual exam and follow-up on chronic medical conditions Main concern is his weight. No other complaints or medical concerns today.  HPI   Prior to Admission medications   Medication Sig Start Date End Date Taking? Authorizing Provider  Cholecalciferol (VITAMIN D3 PO) Take 1 capsule by mouth daily.   Yes [provider]  econazole nitrate 1 % cream Apply 1 Application topically daily. 12/01/23  Yes [provider]  finasteride (PROSCAR) 5 MG tablet Take 5 mg by mouth every morning.   Yes [provider]  Multiple Vitamins-Minerals (EMERGEN-C IMMUNE PO) Take 1 tablet by mouth. With water    Yes [provider]  metFORMIN  (GLUCOPHAGE ) 1000 MG tablet Take 1 tablet (1,000 mg total) by mouth 2 (two) times daily with a meal. Patient not taking: Reported on 05/02/2024 10/13/23   Almedia Cordell Jose, MD  Semaglutide -Weight Management (WEGOVY ) 1 MG/0.5ML SOAJ Inject 1 mg into the skin once a week. 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   Prediabetes 03/31/2022   Chronic pain of right knee 03/31/2022   Other hyperlipidemia 04/01/2021   Vitamin D  deficiency 04/01/2021   Tobacco abuse 12/07/2018   Multinodular goiter 09/21/2012   Cigarette smoker 08/19/2010   BENIGN PROSTATIC HYPERTROPHY, HX OF 08/19/2010   HYPERCHOLESTEROLEMIA 04/25/2009    Morbid obesity (HCC) 09/06/2007   OBSTRUCTIVE SLEEP APNEA 09/06/2007    Past Medical History:  Diagnosis Date   Arthritis    Back pain    BPH (benign prostatic hyperplasia)    Cataract    Difficult intubation    Epididymitis    Hyperlipidemia    Joint pain    Morbid obesity (HCC)    OSA (obstructive sleep apnea)    Pneumonia    Pre-diabetes    Sleep apnea    Upper airway cough syndrome     Past Surgical History:  Procedure Laterality Date   CATARACT EXTRACTION W/ INTRAOCULAR LENS IMPLANT Bilateral    COLONOSCOPY WITH PROPOFOL  N/A 05/04/2023   Procedure: COLONOSCOPY WITH PROPOFOL ;  Surgeon: Abran Norleen SAILOR, MD;  Location: [facility];  Service: Gastroenterology;  Laterality: N/A;   POLYPECTOMY  05/04/2023   Procedure: POLYPECTOMY;  Surgeon: Abran Norleen SAILOR, MD;  Location: [facility];  Service: Gastroenterology;;   TESTICLE REMOVAL  age 89   WISDOM TOOTH EXTRACTION     XI ROBOTIC ASSISTED SIMPLE PROSTATECTOMY N/A 10/07/2022   Procedure: XI ROBOTIC ASSISTED SIMPLE PROSTATECTOMY;  Surgeon: Alvaro Hummer, MD;  Location: [facility];  Service: Urology;  Laterality: N/A;  3 HRS    Social History   Socioeconomic History   Marital status: Married    Spouse name: Not on file   Number of children: Not on file   Years of education: Not on file   Highest education level: Bachelor's degree (e.g., BA, AB, BS)  Occupational History  Occupation: Teacher, music: [employer]  Tobacco Use   Smoking status: Every Day    Current packs/day: 0.50    Average packs/day: 0.5 packs/day for 65.5 years (32.7 ttl pk-yrs)    Types: Cigarettes    Start date: 1985   Smokeless tobacco: Never   Tobacco comments:    1/2 pack per day 08/26/21  Vaping Use   Vaping status: Never Used  Substance and Sexual Activity   Alcohol use: Yes    Alcohol/week: 3.0 - 4.0 standard drinks of alcohol    Types: 3 - 4 Standard drinks or equivalent per week   Drug use: No   Sexual activity:  Never  Other Topics Concern   Not on file  Social History Narrative   Not on file   Social Drivers of Health   Financial Resource Strain: Low Risk  (05/02/2024)   Overall Financial Resource Strain (CARDIA)    Difficulty of Paying Living Expenses: Not hard at all  Food Insecurity: No Food Insecurity (05/02/2024)   Hunger Vital Sign    Worried About Running Out of Food in the Last Year: Never true    Ran Out of Food in the Last Year: Never true  Transportation Needs: No Transportation Needs (05/02/2024)   PRAPARE - Administrator, Civil Service (Medical): No    Lack of Transportation (Non-Medical): No  Physical Activity: Insufficiently Active (05/02/2024)   Exercise Vital Sign    Days of Exercise per Week: 3 days    Minutes of Exercise per Session: 30 min  Stress: Stress Concern Present (05/02/2024)   [employer] of Occupational Health - Occupational Stress Questionnaire    Feeling of Stress: To some extent  Social Connections: Socially Integrated (05/02/2024)   Social Connection and Isolation Panel    Frequency of Communication with Friends and Family: More than three times a week    Frequency of Social Gatherings with Friends and Family: Three times a week    Attends Religious Services: 1 to 4 times per year    Active Member of Clubs or Organizations: Yes    Attends Banker Meetings: 1 to 4 times per year    Marital Status: Married  Catering manager Violence: Not At Risk (10/07/2022)   Humiliation, Afraid, Rape, and Kick questionnaire    Fear of Current or Ex-Partner: No    Emotionally Abused: No    Physically Abused: No    Sexually Abused: No    Family History  Problem Relation Age of Onset   Coronary artery disease Mother 31   Diabetes Mother    Heart disease Mother    Hyperlipidemia Mother    Heart failure Mother    Diabetes Father    Heart disease Father    Diabetes Brother        older brother   Cancer Neg Hx    Colon cancer Neg Hx     Esophageal cancer Neg Hx    Rectal cancer Neg Hx    Stomach cancer Neg Hx      Review of Systems  Constitutional: Negative.  Negative for chills and fever.  HENT: Negative.  Negative for congestion and sore throat.   Respiratory: Negative.  Negative for cough and shortness of breath.   Cardiovascular: Negative.  Negative for chest pain and palpitations.  Gastrointestinal:  Negative for abdominal pain, diarrhea, nausea and vomiting.  Genitourinary: Negative.  Negative for dysuria and hematuria.  Skin: Negative.  Negative  for rash.  Neurological: Negative.  Negative for dizziness and headaches.  All other systems reviewed and are negative.   Vitals:   05/02/24 1308  BP: 128/72  Pulse: 70  Temp: 98.3 F (36.8 C)  SpO2: 96%    Physical Exam Vitals reviewed.  Constitutional:      Appearance: Normal appearance. He is obese.  HENT:     Mouth/Throat:     Mouth: Mucous membranes are moist.     Pharynx: Oropharynx is clear.   Eyes:     Extraocular Movements: Extraocular movements intact.     Pupils: Pupils are equal, round, and reactive to light.    Cardiovascular:     Rate and Rhythm: Normal rate and regular rhythm.     Pulses: Normal pulses.     Heart sounds: Normal heart sounds.  Pulmonary:     Effort: Pulmonary effort is normal.     Breath sounds: Normal breath sounds.  Abdominal:     Palpations: Abdomen is soft.     Tenderness: There is no abdominal tenderness.   Musculoskeletal:     Cervical back: No tenderness.  Lymphadenopathy:     Cervical: No cervical adenopathy.   Skin:    General: Skin is warm and dry.   Neurological:     Mental Status: He is alert and oriented to person, place, and time.   Psychiatric:        Mood and Affect: Mood normal.        Behavior: Behavior normal.      ASSESSMENT & PLAN: Problem List Items Addressed This Visit       Genitourinary   BPH with obstruction/lower urinary tract symptoms   Status post robotic assisted  prostate surgery last November2023 Follows up with urologist on a regular basis Still taking finasteride 5 mg daily        Other   Morbid obesity (HCC)   Still struggling with weight loss despite eating better Advised to decrease amount of daily carbohydrate intake and daily calories and increase amount of plant based protein in his diet Not taking metformin .  Tried Wegovy  in the past with good results but too expensive.  Advised to contact medical insurance company and inquire about Zepbound coverage. Benefits of exercise discussed Not interested in weight management clinic      Cigarette smoker   Cardiovascular and cancer risks associated with smoking discussed Smoking cessation advice given.      Prediabetes   Diet and nutrition discussed  Hemoglobin A1c done today.      Other Visit Diagnoses       Encounter for general adult medical examination with abnormal findings    -  Primary   Relevant Orders   Comprehensive metabolic panel with GFR   CBC with Differential/Platelet   PSA   TSH   Hemoglobin A1c   Lipid panel     Screening for deficiency anemia       Relevant Orders   CBC with Differential/Platelet     Screening for lipoid disorders       Relevant Orders   Lipid panel     Screening for endocrine, metabolic and immunity disorder       Relevant Orders   Comprehensive metabolic panel with GFR   TSH   Hemoglobin A1c     Screening for prostate cancer       Relevant Orders   PSA      Modifiable risk factors discussed with patient. Anticipatory guidance according to age  provided. The following topics were also discussed: Social Determinants of Health Smoking and cardiovascular/cancer risks associated with this. Diet and nutrition Benefits of exercise Cancer screening and review of colonoscopy report from 2024 Vaccinations review and recommendations Cardiovascular risk assessment The 10-year ASCVD risk score (Arnett DK, et al., 2019) is: 30%   Values  used to calculate the score:     Age: 58 years     Clincally relevant sex: Male     Is Non-Hispanic African American: Yes     Diabetic: Yes     Tobacco smoker: Yes     Systolic Blood Pressure: 128 mmHg     Is BP treated: No     HDL Cholesterol: 35.3 mg/dL     Total Cholesterol: 178 mg/dL  Mental health including depression and anxiety Fall and accident prevention  Patient Instructions  Health Maintenance, Male Adopting a healthy lifestyle and getting preventive care are important in promoting health and wellness. Ask your health care provider about: The right schedule for you to have regular tests and exams. Things you can do on your own to prevent diseases and keep yourself healthy. What should I know about diet, weight, and exercise? Eat a healthy diet  Eat a diet that includes plenty of vegetables, fruits, low-fat dairy products, and lean protein. Do not eat a lot of foods that are high in solid fats, added sugars, or sodium. Maintain a healthy weight Body mass index (BMI) is a measurement that can be used to identify possible weight problems. It estimates body fat based on height and weight. Your health care provider can help determine your BMI and help you achieve or maintain a healthy weight. Get regular exercise Get regular exercise. This is one of the most important things you can do for your health. Most adults should: Exercise for at least 150 minutes each week. The exercise should increase your heart rate and make you sweat (moderate-intensity exercise). Do strengthening exercises at least twice a week. This is in addition to the moderate-intensity exercise. Spend less time sitting. Even light physical activity can be beneficial. Watch cholesterol and blood lipids Have your blood tested for lipids and cholesterol at 64 years of age, then have this test every 5 years. You may need to have your cholesterol levels checked more often if: Your lipid or cholesterol levels are  high. You are older than 64 years of age. You are at high risk for heart disease. What should I know about cancer screening? Many types of cancers can be detected early and may often be prevented. Depending on your health history and family history, you may need to have cancer screening at various ages. This may include screening for: Colorectal cancer. Prostate cancer. Skin cancer. Lung cancer. What should I know about heart disease, diabetes, and high blood pressure? Blood pressure and heart disease High blood pressure causes heart disease and increases the risk of stroke. This is more likely to develop in people who have high blood pressure readings or are overweight. Talk with your health care provider about your target blood pressure readings. Have your blood pressure checked: Every 3-5 years if you are 43-55 years of age. Every year if you are 18 years old or older. If you are between the ages of 66 and 66 and are a current or former smoker, ask your health care provider if you should have a one-time screening for abdominal aortic aneurysm (AAA). Diabetes Have regular diabetes screenings. This checks your fasting blood sugar level.  Have the screening done: Once every three years after age 54 if you are at a normal weight and have a low risk for diabetes. More often and at a younger age if you are overweight or have a high risk for diabetes. What should I know about preventing infection? Hepatitis B If you have a higher risk for hepatitis B, you should be screened for this virus. Talk with your health care provider to find out if you are at risk for hepatitis B infection. Hepatitis C Blood testing is recommended for: Everyone born from [birth year] through 1966. Anyone with known risk factors for hepatitis C. Sexually transmitted infections (STIs) You should be screened each year for STIs, including gonorrhea and chlamydia, if: You are sexually active and are younger than 64 years of  age. You are older than 64 years of age and your health care provider tells you that you are at risk for this type of infection. Your sexual activity has changed since you were last screened, and you are at increased risk for chlamydia or gonorrhea. Ask your health care provider if you are at risk. Ask your health care provider about whether you are at high risk for HIV. Your health care provider may recommend a prescription medicine to help prevent HIV infection. If you choose to take medicine to prevent HIV, you should first get tested for HIV. You should then be tested every 3 months for as long as you are taking the medicine. Follow these instructions at home: Alcohol use Do not drink alcohol if your health care provider tells you not to drink. If you drink alcohol: Limit how much you have to 0-2 drinks a day. Know how much alcohol is in your drink. In the [place] one drink equals one 12 oz bottle of beer (355 mL), one 5 oz glass of wine (148 mL), or one 1 oz glass of hard liquor (44 mL). Lifestyle Do not use any products that contain nicotine  or tobacco. These products include cigarettes, chewing tobacco, and vaping devices, such as e-cigarettes. If you need help quitting, ask your health care provider. Do not use street drugs. Do not share needles. Ask your health care provider for help if you need support or information about quitting drugs. General instructions Schedule regular health, dental, and eye exams. Stay current with your vaccines. Tell your health care provider if: You often feel depressed. You have ever been abused or do not feel safe at home. Summary Adopting a healthy lifestyle and getting preventive care are important in promoting health and wellness. Follow your health care provider's instructions about healthy diet, exercising, and getting tested or screened for diseases. Follow your health care provider's instructions on monitoring your cholesterol and blood  pressure. This information is not intended to replace advice given to you by your health care provider. Make sure you discuss any questions you have with your health care provider. Document Revised: 03/17/2021 Document Reviewed: 03/17/2021 Elsevier Patient Education  2024 Elsevier Inc.     Emil Schaumann, MD [facility]

## 2024-05-02 NOTE — Assessment & Plan Note (Signed)
 Status post robotic assisted prostate surgery last November2023 Follows up with urologist on a regular basis Still taking finasteride 5 mg daily

## 2024-05-02 NOTE — Assessment & Plan Note (Signed)
 Cardiovascular and cancer risks associated with smoking discussed. Smoking cessation advice given.

## 2024-05-02 NOTE — Assessment & Plan Note (Signed)
Diet and nutrition discussed.  Hemoglobin A1c done today. 

## 2024-05-02 NOTE — Patient Instructions (Signed)
 Health Maintenance, Male  Adopting a healthy lifestyle and getting preventive care are important in promoting health and wellness. Ask your health care provider about:  The right schedule for you to have regular tests and exams.  Things you can do on your own to prevent diseases and keep yourself healthy.  What should I know about diet, weight, and exercise?  Eat a healthy diet    Eat a diet that includes plenty of vegetables, fruits, low-fat dairy products, and lean protein.  Do not eat a lot of foods that are high in solid fats, added sugars, or sodium.  Maintain a healthy weight  Body mass index (BMI) is a measurement that can be used to identify possible weight problems. It estimates body fat based on height and weight. Your health care provider can help determine your BMI and help you achieve or maintain a healthy weight.  Get regular exercise  Get regular exercise. This is one of the most important things you can do for your health. Most adults should:  Exercise for at least 150 minutes each week. The exercise should increase your heart rate and make you sweat (moderate-intensity exercise).  Do strengthening exercises at least twice a week. This is in addition to the moderate-intensity exercise.  Spend less time sitting. Even light physical activity can be beneficial.  Watch cholesterol and blood lipids  Have your blood tested for lipids and cholesterol at 64 years of age, then have this test every 5 years.  You may need to have your cholesterol levels checked more often if:  Your lipid or cholesterol levels are high.  You are older than 64 years of age.  You are at high risk for heart disease.  What should I know about cancer screening?  Many types of cancers can be detected early and may often be prevented. Depending on your health history and family history, you may need to have cancer screening at various ages. This may include screening for:  Colorectal cancer.  Prostate cancer.  Skin cancer.  Lung  cancer.  What should I know about heart disease, diabetes, and high blood pressure?  Blood pressure and heart disease  High blood pressure causes heart disease and increases the risk of stroke. This is more likely to develop in people who have high blood pressure readings or are overweight.  Talk with your health care provider about your target blood pressure readings.  Have your blood pressure checked:  Every 3-5 years if you are 9-95 years of age.  Every year if you are 85 years old or older.  If you are between the ages of 29 and 29 and are a current or former smoker, ask your health care provider if you should have a one-time screening for abdominal aortic aneurysm (AAA).  Diabetes  Have regular diabetes screenings. This checks your fasting blood sugar level. Have the screening done:  Once every three years after age 23 if you are at a normal weight and have a low risk for diabetes.  More often and at a younger age if you are overweight or have a high risk for diabetes.  What should I know about preventing infection?  Hepatitis B  If you have a higher risk for hepatitis B, you should be screened for this virus. Talk with your health care provider to find out if you are at risk for hepatitis B infection.  Hepatitis C  Blood testing is recommended for:  Everyone born from [birth year] through 1966.  Anyone  with known risk factors for hepatitis C.  Sexually transmitted infections (STIs)  You should be screened each year for STIs, including gonorrhea and chlamydia, if:  You are sexually active and are younger than 64 years of age.  You are older than 64 years of age and your health care provider tells you that you are at risk for this type of infection.  Your sexual activity has changed since you were last screened, and you are at increased risk for chlamydia or gonorrhea. Ask your health care provider if you are at risk.  Ask your health care provider about whether you are at high risk for HIV. Your health care provider  may recommend a prescription medicine to help prevent HIV infection. If you choose to take medicine to prevent HIV, you should first get tested for HIV. You should then be tested every 3 months for as long as you are taking the medicine.  Follow these instructions at home:  Alcohol use  Do not drink alcohol if your health care provider tells you not to drink.  If you drink alcohol:  Limit how much you have to 0-2 drinks a day.  Know how much alcohol is in your drink. In the [place] one drink equals one 12 oz bottle of beer (355 mL), one 5 oz glass of wine (148 mL), or one 1 oz glass of hard liquor (44 mL).  Lifestyle  Do not use any products that contain nicotine or tobacco. These products include cigarettes, chewing tobacco, and vaping devices, such as e-cigarettes. If you need help quitting, ask your health care provider.  Do not use street drugs.  Do not share needles.  Ask your health care provider for help if you need support or information about quitting drugs.  General instructions  Schedule regular health, dental, and eye exams.  Stay current with your vaccines.  Tell your health care provider if:  You often feel depressed.  You have ever been abused or do not feel safe at home.  Summary  Adopting a healthy lifestyle and getting preventive care are important in promoting health and wellness.  Follow your health care provider's instructions about healthy diet, exercising, and getting tested or screened for diseases.  Follow your health care provider's instructions on monitoring your cholesterol and blood pressure.  This information is not intended to replace advice given to you by your health care provider. Make sure you discuss any questions you have with your health care provider.  Document Revised: 03/17/2021 Document Reviewed: 03/17/2021  Elsevier Patient Education  2024 [employer].

## 2024-05-19 ENCOUNTER — Ambulatory Visit (INDEPENDENT_AMBULATORY_CARE_PROVIDER_SITE_OTHER): Admitting: Internal Medicine

## 2024-05-19 ENCOUNTER — Ambulatory Visit: Payer: Self-pay

## 2024-05-19 ENCOUNTER — Encounter: Payer: Self-pay | Admitting: Internal Medicine

## 2024-05-19 VITALS — BP 140/68 | HR 72 | Temp 98.2°F | Ht 71.0 in | Wt 287.0 lb

## 2024-05-19 DIAGNOSIS — M25422 Effusion, left elbow: Secondary | ICD-10-CM | POA: Insufficient documentation

## 2024-05-19 NOTE — Patient Instructions (Signed)
 You can use neosporin or hydrocortisone. Okay to take zyrtec  for the itching or benadryl

## 2024-05-19 NOTE — Assessment & Plan Note (Signed)
 Suspect normal reaction to mosquito bite treated with alcohol. Advised to use hydrocortisone or neosporin. Can use zyrtec  or benadryl for itching. Will self resolve. Call for worsening redness, swelling, pain.

## 2024-05-19 NOTE — Progress Notes (Signed)
   Subjective:   Patient ID: [name] [name], male    DOB: June 05, 1961, 63 y.o.   MRN: [id number]  HPI The patient is a 63 YO man coming in for rash on elbow. May have been bitten by something did not see anything they do have mosquitos.   Review of Systems  Constitutional: Negative.   HENT: Negative.    Eyes: Negative.   Respiratory:  Negative for cough, chest tightness and shortness of breath.   Cardiovascular:  Negative for chest pain, palpitations and leg swelling.  Gastrointestinal:  Negative for abdominal distention, abdominal pain, constipation, diarrhea, nausea and vomiting.  Musculoskeletal: Negative.   Skin:  Positive for rash.  Neurological: Negative.   Psychiatric/Behavioral: Negative.      Objective:  Physical Exam Constitutional:      Appearance: He is well-developed.  HENT:     Head: Normocephalic and atraumatic.  Cardiovascular:     Rate and Rhythm: Normal rate and regular rhythm.  Pulmonary:     Effort: Pulmonary effort is normal. No respiratory distress.     Breath sounds: Normal breath sounds. No wheezing or rales.  Abdominal:     General: Bowel sounds are normal. There is no distension.     Palpations: Abdomen is soft.     Tenderness: There is no abdominal tenderness. There is no rebound.  Musculoskeletal:     Cervical back: Normal range of motion.  Skin:    General: Skin is warm and dry.     Comments: Redness slight swelling around elbow. No bite detected  Neurological:     Mental Status: He is alert and oriented to person, place, and time.     Coordination: Coordination normal.     Vitals:   05/19/24 0931 05/19/24 0937  BP: (!) 140/68 (!) 140/68  Pulse: 72   Temp: 98.2 F (36.8 C)   TempSrc: Oral   SpO2: 96%   Weight: 287 lb (130.2 kg)   Height: 5' 11 (1.803 m)     Assessment & Plan:  Visit time 15 minutes in face to face communication with patient and coordination of care, additional 5 minutes spent in record review, coordination or  care, ordering tests, communicating/referring to other healthcare professionals, documenting in medical records all on the same day of the visit for total time 20 minutes spent on the visit.

## 2024-05-19 NOTE — Telephone Encounter (Signed)
 FYI Only or Action Required?: FYI only for provider.  Patient was last seen in primary care on 05/02/2024 by Purcell Emil Schanz, MD.  Called Nurse Triage reporting Arm Swelling.  Symptoms began yesterday.  Interventions attempted: Other: wiped with alcohol.  Symptoms are: gradually worsening. Pt's left arm is puffy and warm. Thinks it may be a bug bite.  Triage Disposition: See Physician Within 24 Hours  Patient/caregiver understands and will follow disposition?: Yes                 Copied from CRM [id number]. Topic: Clinical - Red Word Triage >> May 19, 2024  7:56 AM Suzen RAMAN wrote: Red Word that prompted transfer to Nurse Triage: Left arm swollen from possible bug bite(warm to touch) Reason for Disposition  MODERATE arm swelling (e.g., puffiness or swollen feeling of entire arm)  Answer Assessment - Initial Assessment Questions 1. ONSET: When did the swelling start? (e.g., minutes, hours, days)     Mid morning 2. LOCATION: What part of the arm is swollen?  Are both arms swollen or just one arm?     Left arm above elbow 3. SEVERITY: How bad is the swelling? (e.g., localized; mild, moderate, severe)     puffy 4. REDNESS: Is there redness or signs of infection?     Red warm 5. PAIN: Is the swelling painful to touch? If Yes, ask: How painful is it?   (Scale 1-10; mild, moderate or severe)     mild 6. FEVER: Do you have a fever? If Yes, ask: What is it, how was it measured, and when did it start?      no 7. CAUSE: What do you think is causing the arm swelling?     Bug bite 8. MEDICAL HISTORY: Do you have a history of heart failure, kidney disease, liver failure, or cancer?     no 9. RECURRENT SYMPTOM: Have you had arm swelling before? If Yes, ask: When was the last time? What happened that time?     na 10. OTHER SYMPTOMS: Do you have any other symptoms? (e.g., chest pain, difficulty breathing)       no  Protocols used: Arm  Swelling and Edema-A-AH

## 2024-10-30 ENCOUNTER — Ambulatory Visit: Admitting: Emergency Medicine

## 2024-11-06 ENCOUNTER — Ambulatory Visit: Admitting: Emergency Medicine

## 2025-05-03 ENCOUNTER — Encounter: Admitting: Emergency Medicine
